# Patient Record
Sex: Male | Born: 1970 | Race: Black or African American | Hispanic: No | Marital: Married | Smoking: Current some day smoker
Health system: Southern US, Community
[De-identification: ages and names within clinical notes are randomized; demographics above are authoritative.]

## PROBLEM LIST (undated history)

## (undated) DIAGNOSIS — Z9861 Coronary angioplasty status: Secondary | ICD-10-CM

## (undated) DIAGNOSIS — I119 Hypertensive heart disease without heart failure: Secondary | ICD-10-CM

## (undated) DIAGNOSIS — Z720 Tobacco use: Secondary | ICD-10-CM

## (undated) DIAGNOSIS — I5042 Chronic combined systolic (congestive) and diastolic (congestive) heart failure: Secondary | ICD-10-CM

## (undated) DIAGNOSIS — E785 Hyperlipidemia, unspecified: Secondary | ICD-10-CM

## (undated) DIAGNOSIS — I2510 Atherosclerotic heart disease of native coronary artery without angina pectoris: Secondary | ICD-10-CM

## (undated) DIAGNOSIS — I255 Ischemic cardiomyopathy: Secondary | ICD-10-CM

## (undated) HISTORY — DX: Hypertensive heart disease without heart failure: I11.9

## (undated) HISTORY — DX: Chronic combined systolic (congestive) and diastolic (congestive) heart failure: I50.42

## (undated) HISTORY — DX: Tobacco use: Z72.0

## (undated) HISTORY — DX: Hyperlipidemia, unspecified: E78.5

## (undated) HISTORY — DX: Atherosclerotic heart disease of native coronary artery without angina pectoris: I25.10

## (undated) HISTORY — DX: Coronary angioplasty status: Z98.61

---

## 2015-04-22 DIAGNOSIS — Z9861 Coronary angioplasty status: Secondary | ICD-10-CM

## 2015-04-22 DIAGNOSIS — I2510 Atherosclerotic heart disease of native coronary artery without angina pectoris: Secondary | ICD-10-CM

## 2015-04-22 DIAGNOSIS — I5042 Chronic combined systolic (congestive) and diastolic (congestive) heart failure: Secondary | ICD-10-CM

## 2015-04-22 HISTORY — PX: CORONARY ANGIOPLASTY WITH STENT PLACEMENT: SHX49

## 2015-04-22 HISTORY — PX: TRANSTHORACIC ECHOCARDIOGRAM: SHX275

## 2015-04-22 HISTORY — DX: Chronic combined systolic (congestive) and diastolic (congestive) heart failure: I50.42

## 2015-04-22 HISTORY — DX: Atherosclerotic heart disease of native coronary artery without angina pectoris: I25.10

## 2015-05-03 DIAGNOSIS — I213 ST elevation (STEMI) myocardial infarction of unspecified site: Secondary | ICD-10-CM

## 2015-05-03 HISTORY — DX: ST elevation (STEMI) myocardial infarction of unspecified site: I21.3

## 2015-05-06 DIAGNOSIS — F17200 Nicotine dependence, unspecified, uncomplicated: Secondary | ICD-10-CM | POA: Insufficient documentation

## 2015-05-14 ENCOUNTER — Ambulatory Visit: Payer: Self-pay | Admitting: Family Medicine

## 2015-05-23 DIAGNOSIS — R945 Abnormal results of liver function studies: Secondary | ICD-10-CM | POA: Insufficient documentation

## 2015-05-23 DIAGNOSIS — R7989 Other specified abnormal findings of blood chemistry: Secondary | ICD-10-CM | POA: Insufficient documentation

## 2015-05-23 DIAGNOSIS — I5042 Chronic combined systolic (congestive) and diastolic (congestive) heart failure: Secondary | ICD-10-CM | POA: Insufficient documentation

## 2015-05-23 DIAGNOSIS — I5022 Chronic systolic (congestive) heart failure: Secondary | ICD-10-CM | POA: Insufficient documentation

## 2015-12-21 HISTORY — PX: TRANSTHORACIC ECHOCARDIOGRAM: SHX275

## 2015-12-24 ENCOUNTER — Inpatient Hospital Stay
Admission: EM | Admit: 2015-12-24 | Discharge: 2015-12-26 | DRG: 291 | Disposition: A | Discharge status: Home / Self Care | Payer: PRIVATE HEALTH INSURANCE | Attending: Internal Medicine | Admitting: Internal Medicine

## 2015-12-24 ENCOUNTER — Emergency Department: Payer: PRIVATE HEALTH INSURANCE

## 2015-12-24 ENCOUNTER — Encounter: Payer: Self-pay | Admitting: *Deleted

## 2015-12-24 ENCOUNTER — Other Ambulatory Visit: Payer: Self-pay

## 2015-12-24 DIAGNOSIS — J810 Acute pulmonary edema: Secondary | ICD-10-CM | POA: Diagnosis present

## 2015-12-24 DIAGNOSIS — Z6830 Body mass index (BMI) 30.0-30.9, adult: Secondary | ICD-10-CM

## 2015-12-24 DIAGNOSIS — E669 Obesity, unspecified: Secondary | ICD-10-CM | POA: Diagnosis present

## 2015-12-24 DIAGNOSIS — I252 Old myocardial infarction: Secondary | ICD-10-CM | POA: Diagnosis not present

## 2015-12-24 DIAGNOSIS — Z8249 Family history of ischemic heart disease and other diseases of the circulatory system: Secondary | ICD-10-CM | POA: Diagnosis not present

## 2015-12-24 DIAGNOSIS — I5043 Acute on chronic combined systolic (congestive) and diastolic (congestive) heart failure: Secondary | ICD-10-CM | POA: Diagnosis present

## 2015-12-24 DIAGNOSIS — I110 Hypertensive heart disease with heart failure: Secondary | ICD-10-CM | POA: Diagnosis present

## 2015-12-24 DIAGNOSIS — J9601 Acute respiratory failure with hypoxia: Secondary | ICD-10-CM | POA: Diagnosis present

## 2015-12-24 DIAGNOSIS — I255 Ischemic cardiomyopathy: Secondary | ICD-10-CM | POA: Diagnosis present

## 2015-12-24 DIAGNOSIS — Z7902 Long term (current) use of antithrombotics/antiplatelets: Secondary | ICD-10-CM | POA: Diagnosis not present

## 2015-12-24 DIAGNOSIS — I5021 Acute systolic (congestive) heart failure: Secondary | ICD-10-CM

## 2015-12-24 DIAGNOSIS — F17200 Nicotine dependence, unspecified, uncomplicated: Secondary | ICD-10-CM | POA: Diagnosis present

## 2015-12-24 DIAGNOSIS — Z79899 Other long term (current) drug therapy: Secondary | ICD-10-CM

## 2015-12-24 DIAGNOSIS — I160 Hypertensive urgency: Secondary | ICD-10-CM | POA: Diagnosis present

## 2015-12-24 DIAGNOSIS — Z7982 Long term (current) use of aspirin: Secondary | ICD-10-CM

## 2015-12-24 DIAGNOSIS — I2510 Atherosclerotic heart disease of native coronary artery without angina pectoris: Secondary | ICD-10-CM | POA: Diagnosis present

## 2015-12-24 DIAGNOSIS — E876 Hypokalemia: Secondary | ICD-10-CM | POA: Diagnosis present

## 2015-12-24 DIAGNOSIS — Z833 Family history of diabetes mellitus: Secondary | ICD-10-CM | POA: Diagnosis not present

## 2015-12-24 DIAGNOSIS — Z955 Presence of coronary angioplasty implant and graft: Secondary | ICD-10-CM

## 2015-12-24 DIAGNOSIS — I509 Heart failure, unspecified: Secondary | ICD-10-CM | POA: Diagnosis not present

## 2015-12-24 DIAGNOSIS — I5023 Acute on chronic systolic (congestive) heart failure: Secondary | ICD-10-CM | POA: Diagnosis not present

## 2015-12-24 HISTORY — DX: Ischemic cardiomyopathy: I25.5

## 2015-12-24 LAB — CBC
HCT: 39.7 % — ABNORMAL LOW (ref 40.0–52.0)
Hemoglobin: 13.8 g/dL (ref 13.0–18.0)
MCH: 32.5 pg (ref 26.0–34.0)
MCHC: 34.7 g/dL (ref 32.0–36.0)
MCV: 93.9 fL (ref 80.0–100.0)
Platelets: 298 10*3/uL (ref 150–440)
RBC: 4.23 MIL/uL — ABNORMAL LOW (ref 4.40–5.90)
RDW: 14.9 % — ABNORMAL HIGH (ref 11.5–14.5)
WBC: 14.1 10*3/uL — ABNORMAL HIGH (ref 3.8–10.6)

## 2015-12-24 LAB — TROPONIN I: Troponin I: <0.03 ng/mL (ref <0.031)

## 2015-12-24 LAB — BRAIN NATRIURETIC PEPTIDE: B Natriuretic Peptide: 1301 pg/mL — ABNORMAL HIGH (ref 0.0–100.0)

## 2015-12-24 LAB — COMPREHENSIVE METABOLIC PANEL
ALT: 36 U/L (ref 17–63)
AST: 33 U/L (ref 15–41)
Albumin: 4 g/dL (ref 3.5–5.0)
Alkaline Phosphatase: 61 U/L (ref 38–126)
Anion gap: 9 (ref 5–15)
BUN: 14 mg/dL (ref 6–20)
CO2: 23 mmol/L (ref 22–32)
Calcium: 9.4 mg/dL (ref 8.9–10.3)
Chloride: 106 mmol/L (ref 101–111)
Creatinine, Ser: 1.14 mg/dL (ref 0.61–1.24)
GFR calc Af Amer: >60 mL/min (ref >60)
GFR calc non Af Amer: >60 mL/min (ref >60)
Glucose, Bld: 107 mg/dL — ABNORMAL HIGH (ref 65–99)
Potassium: 3.4 mmol/L — ABNORMAL LOW (ref 3.5–5.1)
Sodium: 138 mmol/L (ref 135–145)
Total Bilirubin: 1.8 mg/dL — ABNORMAL HIGH (ref 0.3–1.2)
Total Protein: 7.5 g/dL (ref 6.5–8.1)

## 2015-12-24 LAB — APTT: aPTT: 28 seconds (ref 24–36)

## 2015-12-24 LAB — PROTIME-INR
INR: 1.07
Prothrombin Time: 14.1 seconds (ref 11.4–15.0)

## 2015-12-24 MED ORDER — FUROSEMIDE 10 MG/ML IJ SOLN
40.0000 mg | Freq: Once | INTRAMUSCULAR | Status: AC
  Administered 2015-12-24: 40 mg via INTRAVENOUS

## 2015-12-24 MED ORDER — ONDANSETRON HCL 4 MG/2ML IJ SOLN
4.0000 mg | Freq: Once | INTRAMUSCULAR | Status: AC
  Administered 2015-12-24: 4 mg via INTRAVENOUS

## 2015-12-24 MED ORDER — NITROGLYCERIN IN D5W 200-5 MCG/ML-% IV SOLN
0.0000 ug/min | Freq: Once | INTRAVENOUS | Status: AC
  Administered 2015-12-24: 50 ug/min via INTRAVENOUS
  Filled 2015-12-24: qty 250

## 2015-12-24 MED ORDER — FUROSEMIDE 10 MG/ML IJ SOLN
INTRAMUSCULAR | Status: AC
  Administered 2015-12-24: 40 mg via INTRAVENOUS
  Filled 2015-12-24: qty 4

## 2015-12-24 MED ORDER — ONDANSETRON HCL 4 MG/2ML IJ SOLN
INTRAMUSCULAR | Status: AC
  Filled 2015-12-24: qty 2

## 2015-12-24 MED ORDER — NITROGLYCERIN 0.4 MG SL SUBL
0.4000 mg | SUBLINGUAL_TABLET | SUBLINGUAL | Status: DC | PRN
  Administered 2015-12-24 (×3): 0.4 mg via SUBLINGUAL

## 2015-12-24 MED ORDER — NITROGLYCERIN IN D5W 200-5 MCG/ML-% IV SOLN: 0.0000 ug/min | INTRAVENOUS | Status: DC

## 2015-12-24 NOTE — ED Notes (Addendum)
Pt reports has sob since 1530 today.  Pt had MI 04-2015 and has cardiac stents.  cig smoker and reports a cough today.  Pt denies chest pain.  Pt alert. Pt diaphoretic.  Blood pressure elevated.  No bp meds today.

## 2015-12-24 NOTE — ED Notes (Signed)
Pt put on nonrebreather

## 2015-12-24 NOTE — ED Notes (Signed)
MD at bedside. 

## 2015-12-24 NOTE — H&P (Signed)
[facility] [place] NAME: Jeff Cobb    MR#:  [id number]  DATE OF BIRTH:  [birth year]  DATE OF ADMISSION:  12/24/2015  PRIMARY CARE PHYSICIAN: Pcp Not In System   REQUESTING/REFERRING PHYSICIAN:   CHIEF COMPLAINT:   Chief Complaint  Patient presents with  . Shortness of Breath  . Cough    HISTORY OF PRESENT ILLNESS: Jeff Cobb  is a 45 y.o. male with a known history of hypertension, systolic dysfunction, myocardial infarction presented to the emergency room with difficulty breathing since yesterday afternoon. Patient felt acutely short of breath and is not on home oxygen. Last year of respiration and ENT [facility] for ST elevation myocardial infarction, had a cardiac catheter and stent placement. Patient was initially put on oxygen via nonrebreather mask in the emergency room and stabilized. He was given IV Lasix for diuresis. Patient's chest x-ray showed pulmonary edema he was in heart failure. Has history of orthopnea and paroxysmal nocturnal dyspnea. No history of any chest pain. No history of any cough. Patients blood pressure was very high ,systolic was around [id number] mm of Hg and patient was started on IV nitroglycerin drip in the emergency room. Patient does not follow with any cardiologist in the community.Later on patient was weaned to oxygen via nasal canula at 4 liter.  PAST MEDICAL HISTORY:   Past Medical History  Diagnosis Date  . STEMI (ST elevation myocardial infarction) ([place])   . Hypertension   . Systolic dysfunction     PAST SURGICAL HISTORY: Past Surgical History  Procedure Laterality Date  . Coronary angioplasty with stent placement      SOCIAL HISTORY:  Social History  Substance Use Topics  . Smoking status: Current Every Day Smoker  . Smokeless tobacco: Not on file  . Alcohol Use: 0.0 oz/week    0 Standard drinks or equivalent per week    FAMILY HISTORY:  Family History  Problem Relation Age of Onset  .  Heart disease Father   . Diabetes Mellitus II Father     DRUG ALLERGIES: No Known Allergies  REVIEW OF SYSTEMS:   CONSTITUTIONAL: No fever, fatigue or weakness.  EYES: No blurred or double vision.  EARS, NOSE, AND THROAT: No tinnitus or ear pain.  RESPIRATORY: No cough, has shortness of breath,no wheezing or hemoptysis.  CARDIOVASCULAR: No chest pain, has orthopnea, no edema.  GASTROINTESTINAL: No nausea, vomiting, diarrhea or abdominal pain.  GENITOURINARY: No dysuria, hematuria.  ENDOCRINE: No polyuria, nocturia,  HEMATOLOGY: No anemia, easy bruising or bleeding SKIN: No rash or lesion. MUSCULOSKELETAL: No joint pain or arthritis.   NEUROLOGIC: No tingling, numbness, weakness.  PSYCHIATRY: No anxiety or depression.   MEDICATIONS AT HOME:  Prior to Admission medications   Not on File      PHYSICAL EXAMINATION:   VITAL SIGNS: Blood pressure 160/121, pulse 115, temperature 99.7 F (37.6 C), temperature source Oral, resp. rate 24, height 5\' 6"  (1.676 m), weight 86.183 kg (190 lb), SpO2 96 %.  GENERAL:  45 y.o.-year-old patient lying in the bed with no acute distress.  EYES: Pupils equal, round, reactive to light and accommodation. No scleral icterus. Extraocular muscles intact.  HEENT: Head atraumatic, normocephalic. Oropharynx and nasopharynx clear.  NECK:  Supple, no jugular venous distention. No thyroid enlargement, no tenderness.  LUNGS: Decreased breath sounds bilaterally, basal crepitations both lungs. No use of accessory muscles of respiration.  CARDIOVASCULAR: S1, S2 normal. No murmurs, rubs, or gallops.  ABDOMEN: Soft,  nontender, nondistended. Bowel sounds present. No organomegaly or mass.  EXTREMITIES: No pedal edema, cyanosis, or clubbing.  NEUROLOGIC: Cranial nerves II through XII are intact. Muscle strength 5/5 in all extremities. Sensation intact. Gait not checked.  PSYCHIATRIC: The patient is alert and oriented x 3.  SKIN: No obvious rash, lesion, or ulcer.    LABORATORY PANEL:   CBC  Recent Labs Lab 12/24/15 2153  WBC 14.1*  HGB 13.8  HCT 39.7*  PLT 298  MCV 93.9  MCH 32.5  MCHC 34.7  RDW 14.9*   ------------------------------------------------------------------------------------------------------------------  Chemistries   Recent Labs Lab 12/24/15 2153  NA 138  K 3.4*  CL 106  CO2 23  GLUCOSE 107*  BUN 14  CREATININE 1.14  CALCIUM 9.4  AST 33  ALT 36  ALKPHOS 61  BILITOT 1.8*   ------------------------------------------------------------------------------------------------------------------ estimated creatinine clearance is 86.9 mL/min (by C-G formula based on Cr of 1.14). ------------------------------------------------------------------------------------------------------------------ No results for input(s): TSH, T4TOTAL, T3FREE, THYROIDAB in the last 72 hours.  Invalid input(s): FREET3   Coagulation profile  Recent Labs Lab 12/24/15 2153  INR 1.07   ------------------------------------------------------------------------------------------------------------------- No results for input(s): DDIMER in the last 72 hours. -------------------------------------------------------------------------------------------------------------------  Cardiac Enzymes  Recent Labs Lab 12/24/15 2153  TROPONINI <0.03   ------------------------------------------------------------------------------------------------------------------ Invalid input(s): POCBNP  ---------------------------------------------------------------------------------------------------------------  Urinalysis No results found for: COLORURINE, APPEARANCEUR, LABSPEC, PHURINE, GLUCOSEU, HGBUR, BILIRUBINUR, KETONESUR, PROTEINUR, UROBILINOGEN, NITRITE, LEUKOCYTESUR   RADIOLOGY: Dg Chest Portable 1 View  12/24/2015  CLINICAL DATA:  Shortness of breath. History of myocardial infarction and coronary artery disease. EXAM: PORTABLE CHEST 1 VIEW COMPARISON:   None. FINDINGS: The heart is moderately enlarged. There is evidence of pulmonary edema diffusely throughout both lungs without evidence of significant pleural fluid. IMPRESSION: Cardiac enlargement and diffuse pulmonary edema, likely reflecting congestive heart failure Electronically Signed   By: Aletta Edouard M.D.   On: 12/24/2015 22:21    EKG: Orders placed or performed during the hospital encounter of 12/24/15  . EKG 12-Lead  . EKG 12-Lead    IMPRESSION AND PLAN: 45 year old male patient with history of myocardial infarction, hypertension presented to the emergency room with increased shortness of breath. Admitting diagnosis 1. Acute pulmonary edema 2. Acute systolic heart failure 3. Hypertensive urgency 4. Hypokalemia 5. Acute respiratory failure Treatment plan Admit patient to stepdown unit Diurese patient with IV Lasix Oxygen via nasal cannula IV nitroglycerin drip for control of blood pressure Start patient on aspirin, beta blocker, ACE inhibitor Cardiology consultation Check echocardiogram to assess LV function Cycle troponin check for ischemia Anticoagulate patient with subcutaneous Lovenox.  All the records are reviewed and case discussed with ED provider. Management plans discussed with the patient, family and they are in agreement.  CODE STATUS:FULL Code Status History    This patient does not have a recorded code status. Please follow your organizational policy for patients in this situation.       TOTAL CRITICAL CARE TIME TAKING CARE OF THIS PATIENT: 55 minutes.    Saundra Shelling M.D on 12/24/2015 at 11:59 PM  Between 7am to 6pm - Pager - [phone]  After 6pm go to [email or web address] - password [email or web address] [place] [facility]  Office  [phone]  CC: Primary care physician; Pcp Not In System

## 2015-12-24 NOTE — ED Provider Notes (Signed)
[facility] Emergency Department Provider Note  ____________________________________________  Time seen: 10:15 PM  I have reviewed the triage vital signs and the nursing notes.   HISTORY  Chief Complaint Shortness of Breath and Cough Level 5 caveat:  Portions of the history and physical were unable to be obtained or not in the patient's best interest due to the patient's acute illness    HPI Jeff Cobb is a 45 y.o. male who complains of severe worsening shortness of breath. Onset of symptoms was about 3:00 PM today. He has a history of STEMI which was treated at [facility] last year. He reports being compliant with all his medications except not taking his antihypertensives today or yesterday. He is still smoking. He has not missed any doses of his Plavix. Denies any chest pain at all today. No exertional symptoms. He has a productive cough. No fever. No dizziness or syncope.     No past medical history on file. Hypertension CAD status post STEMI  There are no active problems to display for this patient.    No past surgical history on file. Cardiac catheterization with stent.  No current outpatient prescriptions on file. Carvedilol Nitroglycerin Plavix Aspirin  Allergies Review of patient's allergies indicates no known allergies.   No family history on file.  Social History Social History  Substance Use Topics  . Smoking status: Never Smoker   . Smokeless tobacco: None  . Alcohol Use: Yes  Current daily smoker  Review of Systems  Constitutional:   No fever or chills. No weight changes Eyes:   No vision changes.  ENT:   No sore throat. No rhinorrhea. Cardiovascular:   No chest pain. Respiratory:   Positive shortness of breath and cough. Gastrointestinal:   Negative for abdominal pain, vomiting and diarrhea.  No BRBPR or melena. Genitourinary:   Negative for dysuria or difficulty urinating. Musculoskeletal:   Negative for focal pain or  swelling Skin:   Negative for rash. Neurological:   Negative for headaches, focal weakness or numbness.  10-point ROS otherwise negative.  ____________________________________________   PHYSICAL EXAM:  VITAL SIGNS: ED Triage Vitals  Enc Vitals Group     BP 12/24/15 2134 220/143 mmHg     Pulse Rate 12/24/15 2134 127     Resp 12/24/15 2134 20     Temp 12/24/15 2134 99.7 F (37.6 C)     Temp Source 12/24/15 2134 Oral     SpO2 12/24/15 2134 94 %     Weight 12/24/15 2134 190 lb (86.183 kg)     Height 12/24/15 2134 5\' 6"  (1.676 m)     Head Cir --      Peak Flow --      Pain Score --      Pain Loc --      Pain Edu? --      Excl. in [place]? --     Vital signs reviewed, nursing assessments reviewed. Oxygen saturation 82% on room air, increased to 92% on 4 L nasal cannula  Constitutional:   Alert and oriented. Respiratory distress Eyes:   No scleral icterus. No conjunctival pallor. PERRL. EOMI ENT   Head:   Normocephalic and atraumatic.   Nose:   No congestion/rhinnorhea. No septal hematoma   Mouth/Throat:   MMM, no pharyngeal erythema. No peritonsillar mass.    Neck:   No stridor. No SubQ emphysema. No meningismus. Hematological/Lymphatic/Immunilogical:   No cervical lymphadenopathy. Cardiovascular:   Tachycardia heart rate 140. Symmetric bilateral radial and DP  pulses.  No murmurs.  Respiratory:   Tachypnea, increased work of breathing. Diffuse coarse inspiratory and expiratory breath sounds. Patient coughing up pink frothy sputum.. Gastrointestinal:   Soft and nontender. Non distended. There is no CVA tenderness.  No rebound, rigidity, or guarding. Genitourinary:   deferred Musculoskeletal:   Nontender with normal range of motion in all extremities. No joint effusions.  No lower extremity tenderness.  No edema. Neurologic:   Normal speech and language.  CN 2-10 normal. Motor grossly intact. No gross focal neurologic deficits are appreciated.  Skin:    Skin is  warm, dry and intact. No rash noted.  No petechiae, purpura, or bullae. Psychiatric:   Mood and affect are normal. ____________________________________________    LABS (pertinent positives/negatives) (all labs ordered are listed, but only abnormal results are displayed) Labs Reviewed  CBC - Abnormal; Notable for the following:    WBC 14.1 (*)    RBC 4.23 (*)    HCT 39.7 (*)    RDW 14.9 (*)    All other components within normal limits  COMPREHENSIVE METABOLIC PANEL - Abnormal; Notable for the following:    Potassium 3.4 (*)    Glucose, Bld 107 (*)    Total Bilirubin 1.8 (*)    All other components within normal limits  BRAIN NATRIURETIC PEPTIDE - Abnormal; Notable for the following:    B Natriuretic Peptide 1301.0 (*)    All other components within normal limits  TROPONIN I  APTT  PROTIME-INR   ____________________________________________   EKG  EKG at 10:23 PM interpreted by me Sinus tachycardia rate 119, left axis, normal intervals. Poor R-wave progression in anterior precordial leads. There is one to 2 mm J-point elevation in V2 through V5. Slightly inverted T-wave in aVL, no reciprocal ST depressions. Not consistent with STEMI.  Repeat EKG performed at 10:40 PM interpreted by me Sinus tachycardia rate 111, left axis, normal intervals. Again 2-3 mm J-point elevation in V2 through V5. No reciprocal changes. No STEMI.  Prior EKG from [facility] on 05/05/2015 for comparison shows 4-5 mm J-point elevation in the anterior leads with concurrent T-wave inversions diffusely.  Considering the prior EKG, the J-point elevation is a chronic pattern and does not represent any acute changes. Overall today's EKG is actually improved from previous.  ____________________________________________    RADIOLOGY  Chest x-ray shows diffuse pulmonary edema.  ____________________________________________   PROCEDURES CRITICAL CARE Performed by: Joni Fears, Costella Schwarz   Total critical care time:  35 minutes  Critical care time was exclusive of separately billable procedures and treating other patients.  Critical care was necessary to treat or prevent imminent or life-threatening deterioration.  Critical care was time spent personally by me on the following activities: development of treatment plan with patient and/or surrogate as well as nursing, discussions with consultants, evaluation of patient's response to treatment, examination of patient, obtaining history from patient or surrogate, ordering and performing treatments and interventions, ordering and review of laboratory studies, ordering and review of radiographic studies, pulse oximetry and re-evaluation of patient's condition.   ____________________________________________   INITIAL IMPRESSION / ASSESSMENT AND PLAN / ED COURSE  Pertinent labs & imaging results that were available during my care of the patient were reviewed by me and considered in my medical decision making (see chart for details).  Patient presents with severe respiratory distress and hypoxia, clinical exam highly suspicious for acute pulmonary edema. Oxygen saturation is improved with nasal cannula and then nonrebreather. Give the patient a trial of sublingual nitroglycerin because  his blood pressure was 210/140 and I think that this would help with his pulmonary edema. He did start to feel better after the first nitroglycerin and continued to feel better and better after the second and third sublingual nitroglycerin with it started about a nitroglycerin drip. He continues to feel much better with frequent rechecking. Tachycardia improved down to 110. Blood pressure is improving down to about 160/130. Tachypnea is also improving. Patient is weaning off of the supplemental oxygen and back to nasal cannula. Case discussed with the hospitalist for admission. I also discussed the case with [facility] cardiology since he was managed there before we don't have any previous EKGs  in our electronic medical record to compare to. They note that on August 12 he had a STEMI and underwent cardiac catheterizations. On August 14 he had persistent 4-5 mm ST elevation in the anterior leads. This sounds like a similar pattern to what were seen on our EKGs today except that are EKGs today are actually to a lesser degree abnormal. 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 Initial troponin negative even after several hours of symptoms. EKG is not consistent with STEMI. We'll admit for further management of pulmonary edema and hypoxic respiratory failure.     ____________________________________________   FINAL CLINICAL IMPRESSION(S) / ED DIAGNOSES  Final diagnoses:  Acute pulmonary edema ([place])  Acute respiratory failure with hypoxia    Carrie Mew, MD 12/24/15 [phone]

## 2015-12-24 NOTE — ED Notes (Signed)
Pt taken off non-rebreather, on 4L [place] at this time. Pt 96%

## 2015-12-25 ENCOUNTER — Inpatient Hospital Stay (HOSPITAL_COMMUNITY)
Admit: 2015-12-25 | Discharge: 2015-12-25 | Disposition: A | Discharge status: Home / Self Care | Payer: PRIVATE HEALTH INSURANCE | Attending: Internal Medicine | Admitting: Internal Medicine

## 2015-12-25 DIAGNOSIS — I5043 Acute on chronic combined systolic (congestive) and diastolic (congestive) heart failure: Secondary | ICD-10-CM

## 2015-12-25 DIAGNOSIS — Z9861 Coronary angioplasty status: Secondary | ICD-10-CM

## 2015-12-25 DIAGNOSIS — J810 Acute pulmonary edema: Secondary | ICD-10-CM

## 2015-12-25 DIAGNOSIS — I10 Essential (primary) hypertension: Secondary | ICD-10-CM

## 2015-12-25 DIAGNOSIS — I509 Heart failure, unspecified: Secondary | ICD-10-CM

## 2015-12-25 DIAGNOSIS — Z720 Tobacco use: Secondary | ICD-10-CM

## 2015-12-25 DIAGNOSIS — I255 Ischemic cardiomyopathy: Secondary | ICD-10-CM

## 2015-12-25 DIAGNOSIS — I2510 Atherosclerotic heart disease of native coronary artery without angina pectoris: Secondary | ICD-10-CM

## 2015-12-25 LAB — BASIC METABOLIC PANEL
Anion gap: 8 (ref 5–15)
BUN: 15 mg/dL (ref 6–20)
CO2: 27 mmol/L (ref 22–32)
Calcium: 9.2 mg/dL (ref 8.9–10.3)
Chloride: 102 mmol/L (ref 101–111)
Creatinine, Ser: 1.18 mg/dL (ref 0.61–1.24)
GFR calc Af Amer: >60 mL/min (ref >60)
GFR calc non Af Amer: >60 mL/min (ref >60)
Glucose, Bld: 113 mg/dL — ABNORMAL HIGH (ref 65–99)
Potassium: 3.6 mmol/L (ref 3.5–5.1)
Sodium: 137 mmol/L (ref 135–145)

## 2015-12-25 LAB — MAGNESIUM: Magnesium: 1.8 mg/dL (ref 1.7–2.4)

## 2015-12-25 LAB — TROPONIN I
Troponin I: <0.03 ng/mL (ref <0.031)
Troponin I: <0.03 ng/mL (ref <0.031)
Troponin I: 0.04 ng/mL — ABNORMAL HIGH (ref <0.031)

## 2015-12-25 LAB — MRSA PCR SCREENING: MRSA by PCR: NEGATIVE

## 2015-12-25 LAB — PHOSPHORUS: Phosphorus: 4.7 mg/dL — ABNORMAL HIGH (ref 2.5–4.6)

## 2015-12-25 LAB — GLUCOSE, CAPILLARY: Glucose-Capillary: 115 mg/dL — ABNORMAL HIGH (ref 65–99)

## 2015-12-25 MED ORDER — ONDANSETRON HCL 4 MG/2ML IJ SOLN: 4.0000 mg | Freq: Four times a day (QID) | INTRAMUSCULAR | Status: DC | PRN

## 2015-12-25 MED ORDER — METOPROLOL TARTRATE 25 MG PO TABS
25.0000 mg | ORAL_TABLET | Freq: Two times a day (BID) | ORAL | Status: DC
  Administered 2015-12-25: 25 mg via ORAL
  Filled 2015-12-25: qty 1

## 2015-12-25 MED ORDER — SODIUM CHLORIDE 0.9% FLUSH
3.0000 mL | Freq: Two times a day (BID) | INTRAVENOUS | Status: DC
  Administered 2015-12-25 – 2015-12-26 (×4): 3 mL via INTRAVENOUS

## 2015-12-25 MED ORDER — POTASSIUM CHLORIDE CRYS ER 20 MEQ PO TBCR
20.0000 meq | EXTENDED_RELEASE_TABLET | Freq: Once | ORAL | Status: AC
  Administered 2015-12-25: 20 meq via ORAL
  Filled 2015-12-25: qty 1

## 2015-12-25 MED ORDER — ACETAMINOPHEN 325 MG PO TABS
650.0000 mg | ORAL_TABLET | ORAL | Status: DC | PRN
  Administered 2015-12-25: 650 mg via ORAL
  Filled 2015-12-25: qty 2

## 2015-12-25 MED ORDER — SODIUM CHLORIDE 0.9% FLUSH: 3.0000 mL | INTRAVENOUS | Status: DC | PRN

## 2015-12-25 MED ORDER — ASPIRIN EC 81 MG PO TBEC
81.0000 mg | DELAYED_RELEASE_TABLET | Freq: Every day | ORAL | Status: DC
  Administered 2015-12-25 – 2015-12-26 (×2): 81 mg via ORAL
  Filled 2015-12-25 (×2): qty 1

## 2015-12-25 MED ORDER — ENOXAPARIN SODIUM 40 MG/0.4ML ~~LOC~~ SOLN
40.0000 mg | SUBCUTANEOUS | Status: DC
  Administered 2015-12-25: 40 mg via SUBCUTANEOUS
  Filled 2015-12-25: qty 0.4

## 2015-12-25 MED ORDER — FENTANYL CITRATE (PF) 100 MCG/2ML IJ SOLN: 12.5000 ug | INTRAMUSCULAR | Status: DC | PRN

## 2015-12-25 MED ORDER — CARVEDILOL 12.5 MG PO TABS
25.0000 mg | ORAL_TABLET | Freq: Two times a day (BID) | ORAL | Status: DC
  Administered 2015-12-25 – 2015-12-26 (×2): 25 mg via ORAL
  Filled 2015-12-25 (×2): qty 2

## 2015-12-25 MED ORDER — SPIRONOLACTONE 25 MG PO TABS
50.0000 mg | ORAL_TABLET | Freq: Every day | ORAL | Status: DC
  Administered 2015-12-25 – 2015-12-26 (×2): 50 mg via ORAL
  Filled 2015-12-25 (×2): qty 2

## 2015-12-25 MED ORDER — POTASSIUM CHLORIDE CRYS ER 20 MEQ PO TBCR
40.0000 meq | EXTENDED_RELEASE_TABLET | Freq: Once | ORAL | Status: AC
  Administered 2015-12-25: 40 meq via ORAL
  Filled 2015-12-25: qty 2

## 2015-12-25 MED ORDER — CETYLPYRIDINIUM CHLORIDE 0.05 % MT LIQD
7.0000 mL | Freq: Two times a day (BID) | OROMUCOSAL | Status: DC
  Administered 2015-12-25 – 2015-12-26 (×3): 7 mL via OROMUCOSAL

## 2015-12-25 MED ORDER — DEXTROSE 5 % IV SOLN
1.0000 mg/min | INTRAVENOUS | Status: DC
  Administered 2015-12-25: 1 mg/min via INTRAVENOUS
  Filled 2015-12-25: qty 100

## 2015-12-25 MED ORDER — FUROSEMIDE 10 MG/ML IJ SOLN
80.0000 mg | Freq: Two times a day (BID) | INTRAMUSCULAR | Status: DC
  Administered 2015-12-25 – 2015-12-26 (×3): 80 mg via INTRAVENOUS
  Filled 2015-12-25 (×3): qty 8

## 2015-12-25 MED ORDER — LISINOPRIL 10 MG PO TABS
10.0000 mg | ORAL_TABLET | Freq: Every day | ORAL | Status: DC
  Administered 2015-12-25 – 2015-12-26 (×2): 10 mg via ORAL
  Filled 2015-12-25 (×2): qty 1

## 2015-12-25 MED ORDER — SODIUM CHLORIDE 0.9 % IV SOLN: 250.0000 mL | INTRAVENOUS | Status: DC | PRN

## 2015-12-25 MED ORDER — ATORVASTATIN CALCIUM 20 MG PO TABS: 40.0000 mg | ORAL_TABLET | Freq: Every day | ORAL | Status: DC

## 2015-12-25 NOTE — Care Management (Addendum)
Patient had am MI in August 2016 and treated at [facility].  He admits that his follow up and compliance was not good  because he was in between [employer] and under a lot of stress.  He admits to scattered noncompliance with meds.  He says he is in a better place now and compliant with his treatment regime.  He is gong to get established with [facility] and [facility] for PCP.  Wonder if patient would benefit from [employer] program.  Discussed that CM will follow discharge meds to make sure there are no  cost prohibitive meds ordered at discharge.  Patient will need to be assessed for need of home 02.  Referral to Heart Failure Clinic.

## 2015-12-25 NOTE — Consult Note (Addendum)
Cardiology Consultation Note  Patient ID: Washington Georgiev, MRN: [id number], DOB/AGE: [birth year] 45 y.o. Admit date: 12/24/2015   Date of Consult: 12/25/2015 Primary Physician: Pcp Not In System Primary Cardiologist: New to [facility], previously seen by [facility] s/p anterior wall ST elevation MI 05/03/2015  Chief Complaint: SOB Reason for Consult: Acute on chronic combined systolic and diastolic CHF with pulmonary edema  HPI: 45 y.o. male with h/o CAD s/p anterior wall ST elevation MI on 05/03/2015 s/p PCI/DES to mid LAD at [facility] with residual 50% stenosis of the distal RCA, otherwise no evidence of CAD seen at time of cardiac cath, ischemic cardiomyopathy with EF of 20-25% by echo, HTN, continued tobacco abuse, and obesity who presented to [facility] on 4/4 with acute SOB and was found to have acute on chronic combined CHF, malignant HTN, and pulmonary edema. Cardiology is consulted for further evaluation.   He has not seen his primary cardiologist at [facility] since September 2016. Since that time he reported doing well up until 3-4 days ago when he developed a cough overnight that was productive of pink-tinged sputum. This progressively led to increased SOB that worsened on 4/4 prompting him to come to the ED for further evaluation. His weight has been stable and he denies any LE edema, abdominal distension, or early satiety. He has never had any chest pain, diaphoresis, nausea, vomiting, or palpitations. He reports taking his medications daily. He does not check his BP at home. He has stable 2-pillow orthopnea.   Upon the patient's arrival to [facility] they were found to have troponin negative x 2, BNP 1301, WBC 14.1, K+ 3.4-->3.6. ECG as below, CXR showed moderate cardiomegaly with evidence of pulmonary edema diffusely throughout bilateral lungs without evidence of significant pleural effusion. Echo is pending. BP was found to be 220/143 upon his arrival. He was started labetalol and nitro gtts, IV Lasix, Lopressor, lisinopril, and  spironolactone. BP has improved to 102/66 at time of cardiology consult this morning. He has diuresed 2.25 L for the admission/24 hours to date.    Past Medical History  Diagnosis Date  . CAD (coronary artery disease)     a. anterior wall STEMI 04/2015: LM no dz, mLAD occluded s/p PCI/DES, LCx no dz, dRCA 50%  . Hypertension   . Ischemic cardiomyopathy     a. echo 8/12/2-16: EF 20-25%, LVH, basal anterior, mid anterior, apical anterior, mid anteroseptal, apical septal and apical hypokinesis. mildly dilated LA; b. echo 05/06/2015: Ef 25%, LVH, trivial MR, dilated thoracic aorta, anteroapical and apical akinesis and severely decreased contraction overall      Most Recent Cardiac Studies: Cardiac cath 05/03/2015: FINDINGS Hemodynamics and Left Heart Catheterization  Aortic pressure: 161/113 mmHg Coronary Angiography  Dominance: Right Left Main: The left main (LMCA) is a large-caliber vessel that originates  from the left coronary sinus. It bifurcates into the left anterior  descending (LAD) and left circumflex (LCx) arteries. There is no  angiographic evidence of significant disease in the LMCA. LAD: The LAD is a large-caliber vessel that gives off one large diagonal  (D) branches before it wraps around the apex. The LAD is occluded in its  mid-portion.  Left Circumflex: The LCx is a medium-caliber vessel that gives off three  obtuse marginal (OM) branches and then continues as a small vessel in the  AV groove. OM1 is a small. OM2 and OM3 are medium to large vessels. There  is no angiographic evidence of significant disease in the LCx. Right Coronary: The right coronary  artery (RCA) is a large-caliber vessel  originating from the right coronary sinus. It bifurcates distally into the  posterior descending artery (PDA) and a posterolateral branch (PL)  consistent with a right dominant system. There is a 50% distal RCA lesion  at PDA/PL bifurcation.  Echo  05/03/2015: Interpretation: Clinical Diagnoses and Echocardiographic Findings Left ventricular hypertrophy Contractile left ventricular dysfunction (severe) Segmental contractile left ventricular dysfunction (see detail below) Decreased left ventricular ejection fraction (20-25%) Anteroseptal and apical myocardial infarction Dilated left atrium Normal right ventricular contractile performance Descriptive Comments - Left Ventricle The left ventricle is normal in size with moderately increased wall thickness and severely decreased contraction. There is basal anterior, mid anterior, apical anterior, mid anteroseptal, apical septal and apical hypokinesis. The visually estimated left ventricular ejection fraction is 20-25%. Diastolic transmitral flow profile and mitral annular tissue Doppler signal are of suboptimal technical quality to evaluatel left ventricular relaxation and chamber compliance.  Echo 05/06/2015: Interpretation: Clinical Diagnoses and Echocardiographic Findings Coronary artery disease Anteroseptal and apical myocardial infarction Left ventricular hypertrophy Segmental contractile left ventricular dysfunction (estimated ejection fraction = 25%) Dilated left atrium Dilated thoracic aorta Descriptive Comments Left Ventricle There is moderate left ventricular hypertrophy with anteroapical and apical akinesis and severely decreased contraction overall. The visually estimated left ventricular ejection fraction is 25%.   Surgical History:  Past Surgical History  Procedure Laterality Date  . Coronary angioplasty with stent placement       Home Meds: Prior to Admission medications   Medication Sig Start Date End Date Taking? Authorizing Provider  acetaminophen (TYLENOL) 325 MG tablet Take 650 mg by mouth every 6 (six) hours as needed.   Yes Historical Provider, MD  aspirin EC 81 MG tablet Take 81 mg by mouth daily.   Yes Historical Provider, MD  atorvastatin  (LIPITOR) 80 MG tablet Take 80 mg by mouth daily.   Yes Historical Provider, MD  carvedilol (COREG) 25 MG tablet Take 25 mg by mouth 2 (two) times daily with a meal.   Yes Historical Provider, MD  clopidogrel (PLAVIX) 75 MG tablet Take 75 mg by mouth daily.   Yes Historical Provider, MD  lisinopril (PRINIVIL,ZESTRIL) 20 MG tablet Take 20 mg by mouth daily.   Yes Historical Provider, MD  nitroGLYCERIN (NITROSTAT) 0.4 MG SL tablet Place 0.4 mg under the tongue every 5 (five) minutes as needed for chest pain.   Yes Historical Provider, MD    Inpatient Medications:  . antiseptic oral rinse  7 mL Mouth Rinse BID  . aspirin EC  81 mg Oral Daily  . furosemide  80 mg Intravenous BID  . lisinopril  10 mg Oral Daily  . metoprolol tartrate  25 mg Oral BID  . sodium chloride flush  3 mL Intravenous Q12H  . spironolactone  50 mg Oral Daily   . labetalol (NORMODYNE) infusion 1 mg/min (12/25/15 0739)  . nitroGLYCERIN 35 mcg/min (12/25/15 0600)    Allergies: No Known Allergies  Social History   Social History  . Marital Status: Married    Spouse Name: N/A  . Number of Children: N/A  . Years of Education: N/A   Occupational History  . unemployed    Social History Main Topics  . Smoking status: Current Every Day Smoker  . Smokeless tobacco: Not on file  . Alcohol Use: 0.0 oz/week    0 Standard drinks or equivalent per week  . Drug Use: No  . Sexual Activity: Not on file   Other Topics Concern  .  Not on file   Social History Narrative     Family History  Problem Relation Age of Onset  . Heart disease Father   . Diabetes Mellitus II Father      Review of Systems: Review of Systems  Constitutional: Positive for malaise/fatigue. Negative for fever, chills, weight loss and diaphoresis.  HENT: Negative for congestion.   Eyes: Negative for discharge and redness.  Respiratory: Positive for cough, sputum production and shortness of breath. Negative for hemoptysis and wheezing.         Pink tinged sputum   Cardiovascular: Positive for PND. Negative for chest pain, palpitations, orthopnea, claudication and leg swelling.  Gastrointestinal: Negative for heartburn, nausea, vomiting and abdominal pain.  Musculoskeletal: Negative for myalgias and falls.  Skin: Negative for rash.  Neurological: Negative for dizziness, tingling, tremors, sensory change, speech change, focal weakness, loss of consciousness and weakness.  Endo/Heme/Allergies: Does not bruise/bleed easily.  Psychiatric/Behavioral: Positive for substance abuse. The patient is not nervous/anxious.        Ongoing tobacco abuse  All other systems reviewed and are negative.   Labs:  Recent Labs  12/24/15 2153 12/25/15 0338  TROPONINI <0.03 <0.03   Lab Results  Component Value Date   WBC 14.1* 12/24/2015   HGB 13.8 12/24/2015   HCT 39.7* 12/24/2015   MCV 93.9 12/24/2015   PLT 298 12/24/2015    Recent Labs Lab 12/24/15 2153 12/25/15 0338  NA 138 137  K 3.4* 3.6  CL 106 102  CO2 23 27  BUN 14 15  CREATININE 1.14 1.18  CALCIUM 9.4 9.2  PROT 7.5  --   BILITOT 1.8*  --   ALKPHOS 61  --   ALT 36  --   AST 33  --   GLUCOSE 107* 113*   No results found for: CHOL, HDL, LDLCALC, TRIG No results found for: DDIMER  Radiology/Studies:  Dg Chest Portable 1 View  12/24/2015  CLINICAL DATA:  Shortness of breath. History of myocardial infarction and coronary artery disease. EXAM: PORTABLE CHEST 1 VIEW COMPARISON:  None. FINDINGS: The heart is moderately enlarged. There is evidence of pulmonary edema diffusely throughout both lungs without evidence of significant pleural fluid. IMPRESSION: Cardiac enlargement and diffuse pulmonary edema, likely reflecting congestive heart failure Electronically Signed   By: Aletta Edouard M.D.   On: 12/24/2015 22:21    EKG: sinus tachycardia, 111 bpm, left axis deviation, poor R wave progression, LVH, old inferior and anterior infarct  Weights: Filed Weights   12/24/15 2134  12/25/15 0020 12/25/15 0300  Weight: 190 lb (86.183 kg) 190 lb (86.183 kg) 193 lb 2 oz (87.6 kg)     Physical Exam: Blood pressure 137/100, pulse 85, temperature 98.4 F (36.9 C), temperature source Oral, resp. rate 20, height 5\' 6"  (1.676 m), weight 193 lb 2 oz (87.6 kg), SpO2 93 %. Body mass index is 31.19 kg/(m^2). General: Well developed, well nourished, in no acute distress. Head: Normocephalic, atraumatic, sclera non-icteric, no xanthomas, nares are without discharge.  Neck: Negative for carotid bruits. JVD elevated approximately 7 cm. Lungs: Bilateral crackles. Breathing is unlabored. Heart: RRR with S1 S2. No murmurs, rubs, or gallops appreciated. Abdomen: Soft, non-tender, non-distended with normoactive bowel sounds. No hepatomegaly. No rebound/guarding. No obvious abdominal masses. Msk:  Strength and tone appear normal for age. Extremities: No clubbing or cyanosis. No edema.  Distal pedal pulses are 2+ and equal bilaterally. Neuro: Alert and oriented X 3. No facial asymmetry. No focal deficit. Moves all extremities  spontaneously. Psych:  Responds to questions appropriately with a normal affect.    Assessment and Plan: 45 y.o. male with h/o CAD s/p anterior wall ST elevation MI on 05/03/2015 s/p PCI/DES to mid LAD at [facility] with residual 50% stenosis of the distal RCA, otherwise no evidence of CAD seen at time of cardiac cath, ischemic cardiomyopathy with EF of 20-25% by echo, HTN, prior tobacco abuse, and obesity who presented to [facility] on 4/4 with acute SOB and was found to have acute on chronic combined CHF, malignant HTN, and pulmonary edema.   1. Acute respiratory distress with hypoxia: -On nasal cannula currently, wean as able -In the setting of acute on chronic combined CHF  2. Acute on chronic combined CHF: -Presented with BP 220/143, subsequently has improved with IV labetalol and nitro gtts, as well as lisinopril, Lopressor, and spironolactone  -He has diuresed 2.25 L for the  admission to date -Given soft BPs currently on the above gtts, into the 0000000 systolic would taper off gtts and manage with PO medications at this time -Would change Lopressor back to his home Coreg given his cardiomyopathy of 25% -Continue aggressive IV diuresis with Lasix 80 mg bid at this time given stable renal function with KCl repletion to maintain potassium of 4.0 -Continue spironolactone  -Change lisinopril to losartan with plan to change to Entresto in 36 hours given his cardiomyopathy  -Check echo to assess EF, wall motion, and right-sided pressures -Prior EF 25% back in August 2016, he reports compliance with his medications. If his EF remains below 35% at this time, and he has been on adequate HF regimen would have him seen by EP for possible ICD -Ok for 2A   3. CAD s/p PCI as above: -No symptoms of angina -Continue aspirin 81 mg and Plavix 75 mg daily -Coreg as above -Lipitor -Troponin negative x 2 to date  3. Malignant HTN: -Exacerbating #1 -Improved -As above -Needs to check BP at home  4. Tobacco abuse: -Cessation advised - 3 min spent in counseling.  5. Obesity: -Weight loss advised  Melvern Banker, PA-C Pager: [phone] 12/25/2015, 8:06 AM

## 2015-12-25 NOTE — Progress Notes (Signed)
[place] Progress Note Patient Name: Logen Kyne DOB: [birth year] MRN: [id number]   Date of Service  12/25/2015  HPI/Events of Note  5 beat self resolvd vt Got diuresis  eICU Interventions  Get mag mphos supp k      Intervention Category Intermediate Interventions: Arrhythmia - evaluation and management  Treshon Stannard J. 12/25/2015, 5:56 AM

## 2015-12-25 NOTE — Progress Notes (Signed)
1000 tolerated weaning and d/c of NTG drip and Labetalol drip.

## 2015-12-25 NOTE — Progress Notes (Signed)
[place] at [place] NAME: Jeff Cobb    MR#:  [id number]  DATE OF BIRTH:  [birth year]  SUBJECTIVE:  CHIEF COMPLAINT:   Chief Complaint  Patient presents with  . Shortness of Breath  . Cough  feels much better  REVIEW OF SYSTEMS:  Review of Systems  Constitutional: Negative for fever, weight loss, malaise/fatigue and diaphoresis.  HENT: Negative for ear discharge, ear pain, hearing loss, nosebleeds, sore throat and tinnitus.   Eyes: Negative for blurred vision and pain.  Respiratory: Positive for cough and shortness of breath. Negative for hemoptysis and wheezing.   Cardiovascular: Negative for chest pain, palpitations, orthopnea and leg swelling.  Gastrointestinal: Negative for heartburn, nausea, vomiting, abdominal pain, diarrhea, constipation and blood in stool.  Genitourinary: Negative for dysuria, urgency and frequency.  Musculoskeletal: Negative for myalgias and back pain.  Skin: Negative for itching and rash.  Neurological: Negative for dizziness, tingling, tremors, focal weakness, seizures, weakness and headaches.  Psychiatric/Behavioral: Negative for depression. The patient is not nervous/anxious.     DRUG ALLERGIES:  No Known Allergies VITALS:  Blood pressure 110/75, pulse 84, temperature 98.7 F (37.1 C), temperature source Oral, resp. rate 15, height 5\' 6"  (1.676 m), weight 87.6 kg (193 lb 2 oz), SpO2 95 %. PHYSICAL EXAMINATION:  Physical Exam  Constitutional: He is oriented to person, place, and time and well-developed, well-nourished, and in no distress.  HENT:  Head: Normocephalic and atraumatic.  Eyes: Conjunctivae and EOM are normal. Pupils are equal, round, and reactive to light.  Neck: Normal range of motion. Neck supple. No tracheal deviation present. No thyromegaly present.  Cardiovascular: Normal rate, regular rhythm and normal heart sounds.   Pulmonary/Chest: Effort normal and breath sounds normal. No  respiratory distress. He has no wheezes. He exhibits no tenderness.  Abdominal: Soft. Bowel sounds are normal. He exhibits no distension. There is no tenderness.  Musculoskeletal: Normal range of motion.  Neurological: He is alert and oriented to person, place, and time. No cranial nerve deficit.  Skin: Skin is warm and dry. No rash noted.  Psychiatric: Mood and affect normal.   LABORATORY PANEL:   CBC  Recent Labs Lab 12/24/15 2153  WBC 14.1*  HGB 13.8  HCT 39.7*  PLT 298   ------------------------------------------------------------------------------------------------------------------ Chemistries   Recent Labs Lab 12/24/15 2153 12/25/15 0338  NA 138 137  K 3.4* 3.6  CL 106 102  CO2 23 27  GLUCOSE 107* 113*  BUN 14 15  CREATININE 1.14 1.18  CALCIUM 9.4 9.2  MG  --  1.8  AST 33  --   ALT 36  --   ALKPHOS 61  --   BILITOT 1.8*  --    RADIOLOGY:  Dg Chest Portable 1 View  12/24/2015  CLINICAL DATA:  Shortness of breath. History of myocardial infarction and coronary artery disease. EXAM: PORTABLE CHEST 1 VIEW COMPARISON:  None. FINDINGS: The heart is moderately enlarged. There is evidence of pulmonary edema diffusely throughout both lungs without evidence of significant pleural fluid. IMPRESSION: Cardiac enlargement and diffuse pulmonary edema, likely reflecting congestive heart failure Electronically Signed   By: Aletta Edouard M.D.   On: 12/24/2015 22:21   ASSESSMENT AND PLAN:  45 y.o. male with h/o CAD s/p anterior wall ST elevation MI on 05/03/2015 s/p PCI/DES to mid LAD at [facility] with residual 50% stenosis of the distal RCA,  ischemic cardiomyopathy with EF of 20-25% by echo, HTN, prior tobacco abuse, and obesity admitted  with acute SOB and was found to have acute on chronic combined CHF, malignant HTN, and pulmonary edema.   1. Acute respiratory distress with hypoxia: - likely due to flash pulmo edema due to uncontrolled BP - now on [place] O2  2. Acute on chronic  combined CHF: - Old echo showed EF 25%, repeat pending - May benefit from AICD if EF still low - continue Coreg, Lasix, Lisinopril, spironolactone  3. CAD s/p PCI as above: -No symptoms of angina -Continue aspirin 81 mg and Plavix 75 mg + B-blocker and Statin  4. Uncontrolled HTN - Likely leading to flash pulmo edema  5. Hypokalemia: replete and recheck    All the records are reviewed and case discussed with Care Management/Social Worker. Management plans discussed with the patient, family and they are in agreement.  CODE STATUS: FULL CODE  TOTAL TIME TAKING CARE OF THIS PATIENT: 35 minutes.   More than 50% of the time was spent in counseling/coordination of care: YES  POSSIBLE D/C IN 1-2 DAYS, DEPENDING ON CLINICAL CONDITION.   [facility], Avraham Benish M.D on 12/25/2015 at 12:37 PM  Between 7am to 6pm - Pager - [phone]  After 6pm go to [email or web address] - password [email or web address] [place] [facility]  Office  [phone]  CC: Primary care physician; Pcp Not In System  Note: This dictation was prepared with Dragon dictation along with smaller phrase technology. Any transcriptional errors that result from this process are unintentional.

## 2015-12-25 NOTE — Progress Notes (Signed)
Patient transferred to floor. Educated on safety. No complaints at this time. Patient resting comfortably with visitor at bedside. Telemetry box 40-11 verified with CCMD and Sonia Baller NT. Will continue to monitor

## 2015-12-25 NOTE — Progress Notes (Signed)
Received call from [place].  Pt with 5 beat run V-Tach.  Informed e-Link.

## 2015-12-25 NOTE — Progress Notes (Signed)
eLink Physician-Brief Progress Note Patient Name: Burnell Haniff DOB: 06/05/1971 MRN: [id number]   Date of Service  12/25/2015  HPI/Events of Note  htN EMERGENCY, HEADACHE, NONFOCAL EXAMINATION  eICU Interventions  Add labetalol drip, in order to reduce NTG which is causing headaches and is NOT an optimal agent to control BP MAP goal not listed. Highest MAP on admission 202/110, so goal is 25% reduction = MAP 100-110 D.w rn     Intervention Category Major Interventions: Hypertension - evaluation and management  Arrow Emmerich J. 12/25/2015, 3:51 AM

## 2015-12-25 NOTE — Progress Notes (Signed)
1505 report called to [employer] on 2A

## 2015-12-26 ENCOUNTER — Telehealth: Payer: Self-pay

## 2015-12-26 DIAGNOSIS — I5023 Acute on chronic systolic (congestive) heart failure: Secondary | ICD-10-CM

## 2015-12-26 LAB — BASIC METABOLIC PANEL
Anion gap: 9 (ref 5–15)
BUN: 21 mg/dL — ABNORMAL HIGH (ref 6–20)
CO2: 25 mmol/L (ref 22–32)
Calcium: 9.2 mg/dL (ref 8.9–10.3)
Chloride: 102 mmol/L (ref 101–111)
Creatinine, Ser: 1.26 mg/dL — ABNORMAL HIGH (ref 0.61–1.24)
GFR calc Af Amer: >60 mL/min (ref >60)
GFR calc non Af Amer: >60 mL/min (ref >60)
Glucose, Bld: 94 mg/dL (ref 65–99)
Potassium: 3.2 mmol/L — ABNORMAL LOW (ref 3.5–5.1)
Sodium: 136 mmol/L (ref 135–145)

## 2015-12-26 LAB — CBC
HCT: 40.2 % (ref 40.0–52.0)
Hemoglobin: 14.3 g/dL (ref 13.0–18.0)
MCH: 33.8 pg (ref 26.0–34.0)
MCHC: 35.5 g/dL (ref 32.0–36.0)
MCV: 95.1 fL (ref 80.0–100.0)
Platelets: 247 10*3/uL (ref 150–440)
RBC: 4.22 MIL/uL — ABNORMAL LOW (ref 4.40–5.90)
RDW: 14.5 % (ref 11.5–14.5)
WBC: 7.8 10*3/uL (ref 3.8–10.6)

## 2015-12-26 LAB — ECHOCARDIOGRAM COMPLETE
Height: 66 in
Weight: 3052.8 oz

## 2015-12-26 MED ORDER — FUROSEMIDE 40 MG PO TABS: 40.0000 mg | ORAL_TABLET | Freq: Every day | ORAL | Status: DC

## 2015-12-26 MED ORDER — POTASSIUM CHLORIDE CRYS ER 20 MEQ PO TBCR
40.0000 meq | EXTENDED_RELEASE_TABLET | Freq: Once | ORAL | Status: AC
  Administered 2015-12-26: 40 meq via ORAL
  Filled 2015-12-26: qty 2

## 2015-12-26 MED ORDER — SPIRONOLACTONE 25 MG PO TABS: 25.0000 mg | ORAL_TABLET | Freq: Every day | ORAL | Status: DC

## 2015-12-26 MED ORDER — SPIRONOLACTONE 50 MG PO TABS: 50.0000 mg | ORAL_TABLET | Freq: Every day | ORAL | Status: DC

## 2015-12-26 NOTE — Progress Notes (Signed)
Pt discharged to home via wc.  Instructions  given to pt.  Questions answered.  No distress.  

## 2015-12-26 NOTE — Progress Notes (Signed)
SUBJECTIVE:  The patient feels significantly better with improved dyspnea. Very good to her urine output with IV furosemide. No chest pain.   Filed Vitals:   12/25/15 2120 12/26/15 0442 12/26/15 0500 12/26/15 0744  BP: 115/86 128/88  130/89  Pulse: 81 70  74  Temp: 98.1 F (36.7 C) 98.3 F (36.8 C)  98.3 F (36.8 C)  TempSrc: Oral Oral  Oral  Resp: 20 20  17   Height:      Weight:   189 lb 4.8 oz (85.866 kg)   SpO2: 98% 97%  96%    Intake/Output Summary (Last 24 hours) at 12/26/15 1028 Last data filed at 12/26/15 0900  Gross per 24 hour  Intake    240 ml  Output   1050 ml  Net   -810 ml    LABS: Basic Metabolic Panel:  Recent Labs  12/25/15 0338 12/26/15 0514  NA 137 136  K 3.6 3.2*  CL 102 102  CO2 27 25  GLUCOSE 113* 94  BUN 15 21*  CREATININE 1.18 1.26*  CALCIUM 9.2 9.2  MG 1.8  --   PHOS 4.7*  --    Liver Function Tests:  Recent Labs  12/24/15 2153  AST 33  ALT 36  ALKPHOS 61  BILITOT 1.8*  PROT 7.5  ALBUMIN 4.0   No results for input(s): LIPASE, AMYLASE in the last 72 hours. CBC:  Recent Labs  12/24/15 2153 12/26/15 0514  WBC 14.1* 7.8  HGB 13.8 14.3  HCT 39.7* 40.2  MCV 93.9 95.1  PLT 298 247   Cardiac Enzymes:  Recent Labs  12/25/15 0338 12/25/15 0920 12/25/15 1501  TROPONINI <0.03 <0.03 0.04*   BNP: Invalid input(s): POCBNP D-Dimer: No results for input(s): DDIMER in the last 72 hours. Hemoglobin A1C: No results for input(s): HGBA1C in the last 72 hours. Fasting Lipid Panel: No results for input(s): CHOL, HDL, LDLCALC, TRIG, CHOLHDL, LDLDIRECT in the last 72 hours. Thyroid Function Tests: No results for input(s): TSH, T4TOTAL, T3FREE, THYROIDAB in the last 72 hours.  Invalid input(s): FREET3 Anemia Panel: No results for input(s): VITAMINB12, FOLATE, FERRITIN, TIBC, IRON, RETICCTPCT in the last 72 hours.   PHYSICAL EXAM General: Well developed, well nourished, in no acute distress HEENT:  Normocephalic and  atramatic Neck:  No JVD.  Lungs: Clear bilaterally to auscultation and percussion. Heart: HRRR . Normal S1 and S2 without gallops or murmurs.  Abdomen: Bowel sounds are positive, abdomen soft and non-tender  Msk:  Back normal, normal gait. Normal strength and tone for age. Extremities: No clubbing, cyanosis or edema.   Neuro: Alert and oriented X 3. Psych:  Good affect, responds appropriately  TELEMETRY: Reviewed telemetry pt in normal sinus rhythm:  ASSESSMENT AND PLAN:  1. Acute on chronic systolic heart failure :  The patient looks significantly better with diuresis and he appears to be euvolemic. He is ready for discharge today. The patient can be discharged home on his home medications but will add furosemide 40 mg once daily and decrease spironolactone to 25 mg once daily.  - We'll arrange follow-up in our clinic in one to 2 weeks. Will need BMP upon follow up.    3. CAD s/p PCI as above: -No symptoms of angina -Continue aspirin 81 mg and Plavix 75 mg daily -Coreg as above -Lipitor  negative troponin.  3. Malignant HTN: -Exacerbating #1 -Improved - Likely in the setting of volume overload. Blood pressure is now normal.  4. Tobacco abuse: -Cessation advised -  Kathlyn Sacramento, MD, [facility] 12/26/2015 10:28 AM

## 2015-12-26 NOTE — Telephone Encounter (Signed)
-----   Message from Blain Pais sent at 12/26/2015 10:07 AM EDT ----- Regarding: tcm/ph 4/21 1:30 Chrisppher Sharolyn Douglas, NP

## 2015-12-26 NOTE — Telephone Encounter (Signed)
Attempted to contact pt for TCM call. No answer, voicemail box not set up.

## 2015-12-26 NOTE — Telephone Encounter (Signed)
Patient contacted regarding discharge from [facility] on 12/26/15.  Patient understands to follow up with provider Ignacia Bayley, NP on 01/10/16 at 1:30 at [facility]. Patient understands discharge instructions? yes Patient understands medications and regiment? yes Patient understands to bring all medications to this visit? yes  Pt reports that he did not get all of his meds, as the pharmacy did not have Plavix, Lipitor or nitro ready when he stopped by.  He will p/u remaining rxs tonight. Pt is planning on going to work tonight, 3rd shift.  He states that he is feeling much better, has been "just laying around all day" and ready to return to work.

## 2015-12-26 NOTE — Discharge Instructions (Signed)
Heart Failure Clinic appointment on January 14, 2016 at 9:00am with Darylene Price, [place]. Please call [phone] to reschedule.   Pulmonary Edema Pulmonary edema is abnormal fluid buildup in the lungs that can make it hard to breathe. HOME CARE  Talk to your doctor about an exercise program.  Eat a healthy diet:  Eat fresh fruits, vegetables, and lean meats.  Limit high fat and salty foods.  Avoid processed, canned, or fried foods.  Avoid fast food.  Follow your doctor's advice about taking medicine and recording the medicine you take.  Follow your doctor's advice about keeping a record of your weight.  Talk to your doctor about keeping track of your blood pressure.  Do not smoke.  Do not use nicotine patches or nicotine gum.  Make a follow-up appointment with your doctor.  Ask your doctor for a copy of your latest heart tracing (ECG) and keep a copy with you at all times. GET HELP RIGHT AWAY IF:   You have chest pain. THIS IS AN EMERGENCY. Do not wait to see if the pain will go away. Call for local emergency medical help. Do not drive yourself to the hospital.  You have sweating, feel sick to your stomach (nauseous), or are experiencing shortness of breath.  Your weight increases more than your doctor tells you it should.  You start to have shortness of breath.  You notice more swelling in your hands, feet, ankles, or belly.  You have dizziness, blurred vision, headache, or unsteadiness that does not go away.  You cough up bloody spit.  You have a cough that does not go away.  You are unable to sleep because it is hard to breathe.  You begin to feel a "jumping" or "fluttering" sensation (palpitations) in the chest that is unusual for you. MAKE SURE YOU:   Understand these instructions.  Will watch your condition.  Will get help right away if you are not doing well or get worse.   This information is not intended to replace advice given to you by your health  care provider. Make sure you discuss any questions you have with your health care provider.   Document Released: 08/26/2009 Document Revised: 09/12/2013 Document Reviewed: 05/15/2013 Elsevier Interactive Patient Education [employer].

## 2015-12-26 NOTE — Care Management (Signed)
Spoke with patient and he is in agreement with CM making initial new patient appointment with [facility]. Appointment: Fredia Sorrow, NP, May 3 at 10:00 am. Patient updated. Placed on discharge instructions. Reviewed medications. All are low cost. Encouraged him to use the [employer] list. DC today home with self care.

## 2015-12-26 NOTE — Progress Notes (Signed)
Initial Heart Failure Clinic appointment scheduled for January 14, 2016 at 9:00am. Thank you for the referral.

## 2015-12-30 NOTE — Discharge Summary (Signed)
[facility] [place] NAME: Jeff Cobb    MR#:  [id number]  DATE OF BIRTH:  12-20-1970  DATE OF ADMISSION:  12/24/2015 ADMITTING PHYSICIAN: Saundra Shelling, MD  DATE OF DISCHARGE: 12/26/2015 11:00 AM  PRIMARY CARE PHYSICIAN: Pcp Not In System    ADMISSION DIAGNOSIS:  Acute pulmonary edema (HCC) [J81.0] Acute respiratory failure with hypoxia (HCC) [J96.01]  DISCHARGE DIAGNOSIS:  Principal Problem:   Acute pulmonary edema (HCC) Active Problems:   CHF (congestive heart failure) ([place])   SECONDARY DIAGNOSIS:   Past Medical History  Diagnosis Date  . CAD (coronary artery disease)     a. anterior wall STEMI 04/2015: LM no dz, mLAD occluded s/p PCI/DES, LCx no dz, dRCA 50%  . Hypertension   . Ischemic cardiomyopathy     a. echo 8/12/2-16: EF 20-25%, LVH, basal anterior, mid anterior, apical anterior, mid anteroseptal, apical septal and apical hypokinesis. mildly dilated LA; b. echo 05/06/2015: Ef 25%, LVH, trivial MR, dilated thoracic aorta, anteroapical and apical akinesis and severely decreased contraction overall    HOSPITAL COURSE:  45 y.o. male with h/o CAD s/p anterior wall ST elevation MI on 05/03/2015 s/p PCI/DES to mid LAD at [facility] with residual 50% stenosis of the distal RCA, ischemic cardiomyopathy with EF of 20-25% by echo, HTN, prior tobacco abuse, and obesity admitted with acute SOB and was found to have acute on chronic combined CHF, malignant HTN, and pulmonary edema.   1. Acute respiratory distress with hypoxia: - likely due to flash pulmo edema due to uncontrolled BP - now resolved and off [place] O2  2. Acute on chronic combined CHF: - Old echo showed EF 25%, repeat showed EF 30-35% - On Coreg, Lasix, Lisinopril, spironolactone  3. CAD s/p PCI in Aug 2016 -No symptoms of angina -Continue aspirin 81 mg and Plavix 75 mg + B-blocker and Statin  4. Uncontrolled HTN - Likely leading to flash pulmo edema - now  improved  5. Hypokalemia: repleted  Feeling much better. Agreeable with D/C plans.  DISCHARGE CONDITIONS:   stable  CONSULTS OBTAINED:  Treatment Team:  Leonie Man, MD  DRUG ALLERGIES:  No Known Allergies  DISCHARGE MEDICATIONS:   Discharge Medication List as of 12/26/2015 10:32 AM    START taking these medications   Details  furosemide (LASIX) 40 MG tablet Take 1 tablet (40 mg total) by mouth daily., Starting 12/26/2015, Until Discontinued, Normal      CONTINUE these medications which have CHANGED   Details  spironolactone (ALDACTONE) 25 MG tablet Take 1 tablet (25 mg total) by mouth daily., Starting 12/26/2015, Until Discontinued, Normal      CONTINUE these medications which have NOT CHANGED   Details  acetaminophen (TYLENOL) 325 MG tablet Take 650 mg by mouth every 6 (six) hours as needed., Until Discontinued, Historical Med    aspirin EC 81 MG tablet Take 81 mg by mouth daily., Until Discontinued, Historical Med    atorvastatin (LIPITOR) 80 MG tablet Take 80 mg by mouth daily., Until Discontinued, Historical Med    carvedilol (COREG) 25 MG tablet Take 25 mg by mouth 2 (two) times daily with a meal., Until Discontinued, Historical Med    clopidogrel (PLAVIX) 75 MG tablet Take 75 mg by mouth daily., Until Discontinued, Historical Med    lisinopril (PRINIVIL,ZESTRIL) 20 MG tablet Take 20 mg by mouth daily., Until Discontinued, Historical Med    nitroGLYCERIN (NITROSTAT) 0.4 MG SL tablet Place 0.4 mg under the  tongue every 5 (five) minutes as needed for chest pain., Until Discontinued, Historical Med         DISCHARGE INSTRUCTIONS:    DIET:  Cardiac diet  DISCHARGE CONDITION:  Good  ACTIVITY:  Activity as tolerated  OXYGEN:  Home Oxygen: No.   Oxygen Delivery: room air  DISCHARGE LOCATION:  home   If you experience worsening of your admission symptoms, develop shortness of breath, life threatening emergency, suicidal or homicidal thoughts you must  seek medical attention immediately by calling 911 or calling your MD immediately  if symptoms less severe.  You Must read complete instructions/literature along with all the possible adverse reactions/side effects for all the Medicines you take and that have been prescribed to you. Take any new Medicines after you have completely understood and accpet all the possible adverse reactions/side effects.   Please note  You were cared for by a hospitalist during your hospital stay. If you have any questions about your discharge medications or the care you received while you were in the hospital after you are discharged, you can call the unit and asked to speak with the hospitalist on call if the hospitalist that took care of you is not available. Once you are discharged, your primary care physician will handle any further medical issues. Please note that NO REFILLS for any discharge medications will be authorized once you are discharged, as it is imperative that you return to your primary care physician (or establish a relationship with a primary care physician if you do not have one) for your aftercare needs so that they can reassess your need for medications and monitor your lab values.    On the day of Discharge:  VITAL SIGNS:  Blood pressure 130/89, pulse 74, temperature 98.3 F (36.8 C), temperature source Oral, resp. rate 17, height 5\' 6"  (1.676 m), weight 85.866 kg (189 lb 4.8 oz), SpO2 96 %.  PHYSICAL EXAMINATION:  GENERAL:  45 y.o.-year-old patient lying in the bed with no acute distress.  EYES: Pupils equal, round, reactive to light and accommodation. No scleral icterus. Extraocular muscles intact.  HEENT: Head atraumatic, normocephalic. Oropharynx and nasopharynx clear.  NECK:  Supple, no jugular venous distention. No thyroid enlargement, no tenderness.  LUNGS: Normal breath sounds bilaterally, no wheezing, rales,rhonchi or crepitation. No use of accessory muscles of respiration.   CARDIOVASCULAR: S1, S2 normal. No murmurs, rubs, or gallops.  ABDOMEN: Soft, non-tender, non-distended. Bowel sounds present. No organomegaly or mass.  EXTREMITIES: No pedal edema, cyanosis, or clubbing.  NEUROLOGIC: Cranial nerves II through XII are intact. Muscle strength 5/5 in all extremities. Sensation intact. Gait not checked.  PSYCHIATRIC: The patient is alert and oriented x 3.  SKIN: No obvious rash, lesion, or ulcer.  DATA REVIEW:   CBC  Recent Labs Lab 12/26/15 0514  WBC 7.8  HGB 14.3  HCT 40.2  PLT 247    Chemistries   Recent Labs Lab 12/24/15 2153 12/25/15 0338 12/26/15 0514  NA 138 137 136  K 3.4* 3.6 3.2*  CL 106 102 102  CO2 23 27 25   GLUCOSE 107* 113* 94  BUN 14 15 21*  CREATININE 1.14 1.18 1.26*  CALCIUM 9.4 9.2 9.2  MG  --  1.8  --   AST 33  --   --   ALT 36  --   --   ALKPHOS 61  --   --   BILITOT 1.8*  --   --     Follow-up Information  Follow up with Ida Rogue, MD. Schedule an appointment as soon as possible for a visit on 02/25/2016.   Specialty:  Cardiology   Why:  Appointmnet made for 01/10/2016 at 1:30   Contact information:   [place] [address] [phone]       Follow up with Alisa Graff, FNP. Go on 01/14/2016.   Specialty:  Family Medicine   Why:  at 9:00am , to the [place] information:   [place] [address] [address] [phone]       Follow up with Amy L Krebs, NP. Go on 01/22/2016.   Specialty:  Family Medicine   Why:  New Patient Appointment- [facility] at [address]   Contact information:   [place] [address] [phone]       Management plans discussed with the patient, family and they are in agreement.  CODE STATUS:  Code Status History    Date Active Date Inactive Code Status Order ID Comments User Context   12/25/2015  3:02 AM 12/26/2015  2:01 PM Full Code [id number]  Saundra Shelling, MD Inpatient       TOTAL TIME TAKING CARE OF THIS PATIENT: 45 minutes.    [facility], Sai Zinn M.D on 12/30/2015 at 8:51 AM  Between 7am to 6pm - Pager - [phone]  After 6pm go to [email or web address] - password [email or web address] [place] [facility]  Office  [phone]  CC: Primary care physician; Pcp Not In System   Note: This dictation was prepared with Dragon dictation along with smaller phrase technology. Any transcriptional errors that result from this process are unintentional.

## 2016-01-10 ENCOUNTER — Encounter: Payer: PRIVATE HEALTH INSURANCE | Admitting: Nurse Practitioner

## 2016-01-10 ENCOUNTER — Encounter: Payer: Self-pay | Admitting: Nurse Practitioner

## 2016-01-10 DIAGNOSIS — I255 Ischemic cardiomyopathy: Secondary | ICD-10-CM | POA: Insufficient documentation

## 2016-01-10 DIAGNOSIS — I119 Hypertensive heart disease without heart failure: Secondary | ICD-10-CM | POA: Insufficient documentation

## 2016-01-10 DIAGNOSIS — E785 Hyperlipidemia, unspecified: Secondary | ICD-10-CM | POA: Insufficient documentation

## 2016-01-10 DIAGNOSIS — E782 Mixed hyperlipidemia: Secondary | ICD-10-CM | POA: Insufficient documentation

## 2016-01-14 ENCOUNTER — Telehealth: Payer: Self-pay | Admitting: Family

## 2016-01-14 ENCOUNTER — Ambulatory Visit: Payer: PRIVATE HEALTH INSURANCE | Admitting: Family

## 2016-01-14 NOTE — Telephone Encounter (Signed)
Patient did not show for his initial Heart Failure Clinic appointment on 01/14/16. Will attempt to reschedule.

## 2016-01-22 ENCOUNTER — Ambulatory Visit: Payer: Self-pay | Admitting: Family Medicine

## 2016-01-23 ENCOUNTER — Encounter: Payer: PRIVATE HEALTH INSURANCE | Admitting: Nurse Practitioner

## 2016-02-04 ENCOUNTER — Encounter: Payer: Self-pay | Admitting: Nurse Practitioner

## 2016-02-04 ENCOUNTER — Ambulatory Visit (INDEPENDENT_AMBULATORY_CARE_PROVIDER_SITE_OTHER): Payer: PRIVATE HEALTH INSURANCE | Admitting: Nurse Practitioner

## 2016-02-04 ENCOUNTER — Ambulatory Visit (INDEPENDENT_AMBULATORY_CARE_PROVIDER_SITE_OTHER): Payer: PRIVATE HEALTH INSURANCE | Admitting: Family Medicine

## 2016-02-04 ENCOUNTER — Encounter: Payer: Self-pay | Admitting: Family Medicine

## 2016-02-04 VITALS — Ht 67.0 in | Wt 200.8 lb

## 2016-02-04 VITALS — BP 146/100 | HR 87 | Temp 98.9°F | Resp 16 | Ht 67.0 in | Wt 200.6 lb

## 2016-02-04 DIAGNOSIS — E785 Hyperlipidemia, unspecified: Secondary | ICD-10-CM

## 2016-02-04 DIAGNOSIS — Z720 Tobacco use: Secondary | ICD-10-CM

## 2016-02-04 DIAGNOSIS — I2510 Atherosclerotic heart disease of native coronary artery without angina pectoris: Secondary | ICD-10-CM

## 2016-02-04 DIAGNOSIS — I110 Hypertensive heart disease with heart failure: Secondary | ICD-10-CM

## 2016-02-04 DIAGNOSIS — I255 Ischemic cardiomyopathy: Secondary | ICD-10-CM | POA: Diagnosis not present

## 2016-02-04 DIAGNOSIS — Z7189 Other specified counseling: Secondary | ICD-10-CM

## 2016-02-04 DIAGNOSIS — F17200 Nicotine dependence, unspecified, uncomplicated: Secondary | ICD-10-CM | POA: Insufficient documentation

## 2016-02-04 DIAGNOSIS — I10 Essential (primary) hypertension: Secondary | ICD-10-CM | POA: Diagnosis not present

## 2016-02-04 DIAGNOSIS — I5022 Chronic systolic (congestive) heart failure: Secondary | ICD-10-CM

## 2016-02-04 DIAGNOSIS — R7989 Other specified abnormal findings of blood chemistry: Secondary | ICD-10-CM | POA: Diagnosis not present

## 2016-02-04 DIAGNOSIS — R945 Abnormal results of liver function studies: Secondary | ICD-10-CM

## 2016-02-04 DIAGNOSIS — Z7689 Persons encountering health services in other specified circumstances: Secondary | ICD-10-CM

## 2016-02-04 MED ORDER — SPIRONOLACTONE 25 MG PO TABS: 25.0000 mg | ORAL_TABLET | Freq: Every day | ORAL | Status: DC

## 2016-02-04 MED ORDER — AMLODIPINE BESYLATE 5 MG PO TABS
5.0000 mg | ORAL_TABLET | Freq: Every day | ORAL | Status: DC
Start: 2016-02-04 — End: 2017-01-28

## 2016-02-04 MED ORDER — LISINOPRIL 20 MG PO TABS: 20.0000 mg | ORAL_TABLET | Freq: Every day | ORAL | Status: DC

## 2016-02-04 MED ORDER — FUROSEMIDE 40 MG PO TABS: 40.0000 mg | ORAL_TABLET | Freq: Every day | ORAL | Status: DC

## 2016-02-04 MED ORDER — ATORVASTATIN CALCIUM 80 MG PO TABS: 80.0000 mg | ORAL_TABLET | Freq: Every day | ORAL | Status: DC

## 2016-02-04 NOTE — Assessment & Plan Note (Signed)
Continue carvedilol ol, lasix, aldactone, and ACE as prescribed by cardiology in previous hospitalization. Pt appears euvolemic today. Encouraged to keep follow-up appt with cardiology today.

## 2016-02-04 NOTE — Progress Notes (Signed)
Subjective:    Patient ID: Jeff Cobb, male    DOB: [birth year], 45 y.o.   MRN: [id number]  HPI: Jeff Cobb is a 45 y.o. male presenting on 02/04/2016 for Establish Care   HPI  Pt presents to establish care today. Previous care provider was None- was seen at [facility]  It has been 9 months since His last PCP visit. Records from previous provider will be requested and reviewed. Current medical problems include:  CAD: Stent placed at [facility] last year in August- has not had regular follow-up with cardiology. Seeing Cardiology today at 11am.  CHF; last EF 30% in April 2017.  Hyperlipidemia: Atorvastatin 80mg  daily. No myalgias, leg pains, or muscle cramping.  Former smoker- quit in April.  Hypertension: checks at [facility] avg 130/87.  Not checking in AM- checks when he gets off work. Has taken BP medications this morning. No chest pain. No SOB. No visual changes. Usually takes medications first thing in the AM.   Health maintenance:  No family history prostate cancer.  No family history colon cancer. TDAP: Has been 10 years.  Pneumovax: Never had. Works in Dietitian.     Past Medical History  Diagnosis Date  . CAD (coronary artery disease)     a. 04/2015 Anterior STEMI/PCI: LM nl, LAD 137m (3.0x15 Xience DES), LCx nl, OM1-3 nl, RCA 50d, PDA nl.  . Hypertensive heart disease   . Ischemic cardiomyopathy     a. 04/2015 Echo: EF 25%, LVH, trivial MR, dilated thoracic aorta, anteroapical and apical akinesis and severely decreased contraction overall; c. 12/2015 Echo: EF 30-35%, mod conc LVH, mid-apicalanteroseptal, anterior, and apical AK, mildly dil LA.  Marland Kitchen Hyperlipidemia   . Tobacco abuse   . Chronic systolic CHF (congestive heart failure) ([place])     a. 12/2015 Echo: EF 30-35%.   Social History   Social History  . Marital Status: Married    Spouse Name: N/A  . Number of Children: N/A  . Years of Education: N/A   Occupational History  . unemployed    Social History Main  Topics  . Smoking status: Former Research scientist (life sciences)  . Smokeless tobacco: Not on file  . Alcohol Use: 0.0 oz/week    0 Standard drinks or equivalent per week  . Drug Use: Yes    Special: Marijuana  . Sexual Activity: Not on file   Other Topics Concern  . Not on file   Social History Narrative   Family History  Problem Relation Age of Onset  . Heart disease Father   . Diabetes Mellitus II Father    Current Outpatient Prescriptions on File Prior to Visit  Medication Sig  . acetaminophen (TYLENOL) 325 MG tablet Take 650 mg by mouth every 6 (six) hours as needed.  Marland Kitchen aspirin EC 81 MG tablet Take 81 mg by mouth daily.  . carvedilol (COREG) 25 MG tablet Take 25 mg by mouth 2 (two) times daily with a meal.  . clopidogrel (PLAVIX) 75 MG tablet Take 75 mg by mouth daily.  . nitroGLYCERIN (NITROSTAT) 0.4 MG SL tablet Place 0.4 mg under the tongue every 5 (five) minutes as needed for chest pain.   No current facility-administered medications on file prior to visit.    Review of Systems  Constitutional: Negative for fever and chills.  HENT: Negative.   Respiratory: Negative for chest tightness, shortness of breath and wheezing.   Cardiovascular: Negative for chest pain, palpitations and leg swelling.  Gastrointestinal: Negative for nausea, vomiting and  abdominal pain.  Endocrine: Negative.   Genitourinary: Negative for dysuria, urgency, discharge, penile pain and testicular pain.  Musculoskeletal: Negative for back pain, joint swelling and arthralgias.  Skin: Negative.   Neurological: Negative for dizziness, weakness, numbness and headaches.  Psychiatric/Behavioral: Negative for sleep disturbance and dysphoric mood.   Per HPI unless specifically indicated above     Objective:    BP 146/100 mmHg  Pulse 87  Temp(Src) 98.9 F (37.2 C) (Oral)  Resp 16  Ht 5\' 7"  (1.702 m)  Wt 200 lb 9.6 oz (90.992 kg)  BMI 31.41 kg/m2  Wt Readings from Last 3 Encounters:  02/04/16 200 lb 9.6 oz (90.992 kg)   12/26/15 189 lb 4.8 oz (85.866 kg)    Physical Exam  Constitutional: He is oriented to person, place, and time. He appears well-developed and well-nourished. No distress.  HENT:  Head: Normocephalic and atraumatic.  Neck: Neck supple. No thyromegaly present.  Cardiovascular: Normal rate, regular rhythm and normal heart sounds.  Exam reveals no gallop and no friction rub.   No murmur heard. Pulmonary/Chest: Effort normal and breath sounds normal. He has no wheezes.  Abdominal: Soft. Bowel sounds are normal. He exhibits no distension. There is no tenderness. There is no rebound.  Musculoskeletal: Normal range of motion. He exhibits no edema or tenderness.  Neurological: He is alert and oriented to person, place, and time. He has normal reflexes.  Skin: Skin is warm and dry. No rash noted. No erythema.  Psychiatric: He has a normal mood and affect. His behavior is normal. Thought content normal.   Results for orders placed or performed during the hospital encounter of 12/24/15  MRSA PCR Screening  Result Value Ref Range   MRSA by PCR NEGATIVE NEGATIVE  CBC  Result Value Ref Range   WBC 14.1 (H) 3.8 - 10.6 K/uL   RBC 4.23 (L) 4.40 - 5.90 MIL/uL   Hemoglobin 13.8 13.0 - 18.0 g/dL   HCT 39.7 (L) 40.0 - 52.0 %   MCV 93.9 80.0 - 100.0 fL   MCH 32.5 26.0 - 34.0 pg   MCHC 34.7 32.0 - 36.0 g/dL   RDW 14.9 (H) 11.5 - 14.5 %   Platelets 298 150 - 440 K/uL  Comprehensive metabolic panel  Result Value Ref Range   Sodium 138 135 - 145 mmol/L   Potassium 3.4 (L) 3.5 - 5.1 mmol/L   Chloride 106 101 - 111 mmol/L   CO2 23 22 - 32 mmol/L   Glucose, Bld 107 (H) 65 - 99 mg/dL   BUN 14 6 - 20 mg/dL   Creatinine, Ser 1.14 0.61 - 1.24 mg/dL   Calcium 9.4 8.9 - 10.3 mg/dL   Total Protein 7.5 6.5 - 8.1 g/dL   Albumin 4.0 3.5 - 5.0 g/dL   AST 33 15 - 41 U/L   ALT 36 17 - 63 U/L   Alkaline Phosphatase 61 38 - 126 U/L   Total Bilirubin 1.8 (H) 0.3 - 1.2 mg/dL   GFR calc non Af Amer >60 >60 mL/min     GFR calc Af Amer >60 >60 mL/min   Anion gap 9 5 - 15  Troponin I  Result Value Ref Range   Troponin I <0.03 <0.031 ng/mL  APTT  Result Value Ref Range   aPTT 28 24 - 36 seconds  Protime-INR  Result Value Ref Range   Prothrombin Time 14.1 11.4 - 15.0 seconds   INR 1.07   Brain natriuretic peptide (order if patient c/o  SOB ONLY)  Result Value Ref Range   B Natriuretic Peptide 1301.0 (H) 0.0 - 100.0 pg/mL  Basic metabolic panel  Result Value Ref Range   Sodium 137 135 - 145 mmol/L   Potassium 3.6 3.5 - 5.1 mmol/L   Chloride 102 101 - 111 mmol/L   CO2 27 22 - 32 mmol/L   Glucose, Bld 113 (H) 65 - 99 mg/dL   BUN 15 6 - 20 mg/dL   Creatinine, Ser 1.18 0.61 - 1.24 mg/dL   Calcium 9.2 8.9 - 10.3 mg/dL   GFR calc non Af Amer >60 >60 mL/min   GFR calc Af Amer >60 >60 mL/min   Anion gap 8 5 - 15  Troponin I  Result Value Ref Range   Troponin I <0.03 <0.031 ng/mL  Troponin I  Result Value Ref Range   Troponin I <0.03 <0.031 ng/mL  Troponin I  Result Value Ref Range   Troponin I 0.04 (H) <0.031 ng/mL  Glucose, capillary  Result Value Ref Range   Glucose-Capillary 115 (H) 65 - 99 mg/dL  Magnesium  Result Value Ref Range   Magnesium 1.8 1.7 - 2.4 mg/dL  Phosphorus  Result Value Ref Range   Phosphorus 4.7 (H) 2.5 - 4.6 mg/dL  Basic metabolic panel  Result Value Ref Range   Sodium 136 135 - 145 mmol/L   Potassium 3.2 (L) 3.5 - 5.1 mmol/L   Chloride 102 101 - 111 mmol/L   CO2 25 22 - 32 mmol/L   Glucose, Bld 94 65 - 99 mg/dL   BUN 21 (H) 6 - 20 mg/dL   Creatinine, Ser 1.26 (H) 0.61 - 1.24 mg/dL   Calcium 9.2 8.9 - 10.3 mg/dL   GFR calc non Af Amer >60 >60 mL/min   GFR calc Af Amer >60 >60 mL/min   Anion gap 9 5 - 15  CBC  Result Value Ref Range   WBC 7.8 3.8 - 10.6 K/uL   RBC 4.22 (L) 4.40 - 5.90 MIL/uL   Hemoglobin 14.3 13.0 - 18.0 g/dL   HCT 40.2 40.0 - 52.0 %   MCV 95.1 80.0 - 100.0 fL   MCH 33.8 26.0 - 34.0 pg   MCHC 35.5 32.0 - 36.0 g/dL   RDW 14.5 11.5 -  14.5 %   Platelets 247 150 - 440 K/uL  Echocardiogram  Result Value Ref Range   Weight 3052.8 oz   Height 66 in   BP 150/98 mmHg      Assessment & Plan:   Problem List Items Addressed This Visit      Cardiovascular and Mediastinum   Ischemic cardiomyopathy    Continue carvedilol ol, lasix, aldactone, and ACE as prescribed by cardiology in previous hospitalization. Pt appears euvolemic today. Encouraged to keep follow-up appt with cardiology today.       Relevant Medications   amLODipine (NORVASC) 5 MG tablet   furosemide (LASIX) 40 MG tablet   lisinopril (PRINIVIL,ZESTRIL) 20 MG tablet   spironolactone (ALDACTONE) 25 MG tablet   atorvastatin (LIPITOR) 80 MG tablet   Other Relevant Orders   Comprehensive metabolic panel   CAD (coronary artery disease)    S/p Stent placement in 04/2015. Continue plavix and aspirin. Pt will follow with cardiology.       Relevant Medications   amLODipine (NORVASC) 5 MG tablet   furosemide (LASIX) 40 MG tablet   lisinopril (PRINIVIL,ZESTRIL) 20 MG tablet   spironolactone (ALDACTONE) 25 MG tablet   atorvastatin (LIPITOR) 80 MG tablet  BP (high blood pressure) - Primary    Add amlodipine 5mg  for better BP control. Continue current regimen as started by Cardiology during previous hospitalization. Check CMET. Recheck 4 weeks. Commended pt on smoking cessation. Encourage DASH diet.       Relevant Medications   amLODipine (NORVASC) 5 MG tablet   furosemide (LASIX) 40 MG tablet   lisinopril (PRINIVIL,ZESTRIL) 20 MG tablet   spironolactone (ALDACTONE) 25 MG tablet   atorvastatin (LIPITOR) 80 MG tablet     Other   Hyperlipidemia    Continue atorvastatin. Recheck lipid panel.       Relevant Medications   amLODipine (NORVASC) 5 MG tablet   furosemide (LASIX) 40 MG tablet   lisinopril (PRINIVIL,ZESTRIL) 20 MG tablet   spironolactone (ALDACTONE) 25 MG tablet   atorvastatin (LIPITOR) 80 MG tablet   Other Relevant Orders   Lipid Profile    Abnormal LFTs    Check liver enzymes today.        Other Visit Diagnoses    Encounter to establish care           Meds ordered this encounter  Medications  . amLODipine (NORVASC) 5 MG tablet    Sig: Take 1 tablet (5 mg total) by mouth daily.    Dispense:  30 tablet    Refill:  11    Order Specific Question:  Supervising Provider    Answer:  Arlis Porta [phone]  . furosemide (LASIX) 40 MG tablet    Sig: Take 1 tablet (40 mg total) by mouth daily.    Dispense:  90 tablet    Refill:  3    Order Specific Question:  Supervising Provider    Answer:  Arlis Porta [phone]  . lisinopril (PRINIVIL,ZESTRIL) 20 MG tablet    Sig: Take 1 tablet (20 mg total) by mouth daily.    Dispense:  90 tablet    Refill:  3    Order Specific Question:  Supervising Provider    Answer:  Arlis Porta [phone]  . spironolactone (ALDACTONE) 25 MG tablet    Sig: Take 1 tablet (25 mg total) by mouth daily.    Dispense:  90 tablet    Refill:  3    Order Specific Question:  Supervising Provider    Answer:  Arlis Porta [phone]  . atorvastatin (LIPITOR) 80 MG tablet    Sig: Take 1 tablet (80 mg total) by mouth daily.    Dispense:  90 tablet    Refill:  3    Order Specific Question:  Supervising Provider    Answer:  Arlis Porta [phone]      Follow up plan: Return in about 4 weeks (around 03/03/2016) for BP check. Marland Kitchen

## 2016-02-04 NOTE — Progress Notes (Signed)
Office Visit    Patient Name: Jeff Cobb Date of Encounter: 02/04/2016  Primary Care Provider:  Leata Mouse, NP Primary Cardiologist:  Roni Bread, MD   Chief Complaint    45 year old male with prior history of anterior myocardial infarction and ischemic myopathy who presents for follow-up after recent hospitalization.  Past Medical History    Past Medical History  Diagnosis Date  . CAD (coronary artery disease)     a. 04/2015 Anterior STEMI/PCI: LM nl, LAD 180m (3.0x15 Xience DES), LCx nl, OM1-3 nl, RCA 50d, PDA nl.  . Hypertensive heart disease   . Ischemic cardiomyopathy     a. 04/2015 Echo: EF 25%, LVH, trivial MR, dilated thoracic aorta, anteroapical and apical akinesis and severely decreased contraction overall; c. 12/2015 Echo: EF 30-35%, mod conc LVH, mid-apicalanteroseptal, anterior, and apical AK, mildly dil LA.  Marland Kitchen Hyperlipidemia   . Tobacco abuse   . Chronic systolic CHF (congestive heart failure) ([place])     a. 12/2015 Echo: EF 30-35%.   Past Surgical History  Procedure Laterality Date  . Coronary angioplasty with stent placement      Allergies  No Known Allergies  History of Present Illness    45 year old male with prior history of coronary artery disease status post anterior wall ST elevation MI in August 2016 requiring drilling stent placement to the mid LAD. As result of this, he was left with LV dysfunction and an EF of 20-25% of the time. He also has a history of hypertension, hyperlipidemia, and tobacco abuse. He was followed at [facility] but then lost to follow-up. He was admitted to [facility] regional in early April with dyspnea and cough and was found to have acute pulmonary edema in the setting of marked it hypertension. He was aggressively diuresed and previous home medications were resumed and adjusted. With this, he had significant clinical improvement. Echo during hospitalization showed an EF of 30-35%. Troponins remain negative and it was not felt that he would  require further ischemic evaluation.  Since his discharge, he reports being compliant with his medications. He has quit smoking but admits to slipping up one day last week and smoking 1 cigarette. He has every intention to quit completely. He is careful with his salt intake and says his wife prepares just about all of his meals. He has been weighing himself daily at work and says his weight has been trending about 195 on that scale. He has not been experiencing any dyspnea on exertion, PND, orthopnea, dizziness, syncope, edema, early satiety, palpitations, or chest pain. He was seen in primary care clinic earlier this morning and his blood pressure was elevated. Amlodipine has been added to his regimen.  Home Medications    Prior to Admission medications   Medication Sig Start Date End Date Taking? Authorizing Provider  acetaminophen (TYLENOL) 325 MG tablet Take 650 mg by mouth every 6 (six) hours as needed.    Historical Provider, MD  amLODipine (NORVASC) 5 MG tablet Take 1 tablet (5 mg total) by mouth daily. 02/04/16   Amy Overton Mam, NP  aspirin EC 81 MG tablet Take 81 mg by mouth daily.    Historical Provider, MD  atorvastatin (LIPITOR) 80 MG tablet Take 1 tablet (80 mg total) by mouth daily. 02/04/16   Amy Overton Mam, NP  carvedilol (COREG) 25 MG tablet Take 25 mg by mouth 2 (two) times daily with a meal.    Historical Provider, MD  clopidogrel (PLAVIX) 75 MG tablet Take 75  mg by mouth daily.    Historical Provider, MD  furosemide (LASIX) 40 MG tablet Take 1 tablet (40 mg total) by mouth daily. 02/04/16   Amy Overton Mam, NP  lisinopril (PRINIVIL,ZESTRIL) 20 MG tablet Take 1 tablet (20 mg total) by mouth daily. 02/04/16   Amy Overton Mam, NP  nitroGLYCERIN (NITROSTAT) 0.4 MG SL tablet Place 0.4 mg under the tongue every 5 (five) minutes as needed for chest pain.    Historical Provider, MD  spironolactone (ALDACTONE) 25 MG tablet Take 1 tablet (25 mg total) by mouth daily. 02/04/16   Amy Overton Mam, NP    Review of Systems    Doing well since d/c.  He denies chest pain, palpitations, dyspnea, pnd, orthopnea, n, v, dizziness, syncope, edema, weight gain, or early satiety.  All other systems reviewed and are otherwise negative except as noted above.  Physical Exam    VS:  Ht 5\' 7"  (1.702 m)  Wt 200 lb 12.8 oz (91.082 kg)  BMI 31.44 kg/m2 , BMI Body mass index is 31.44 kg/(m^2). GEN: Well nourished, well developed, in no acute distress. HEENT: normal. Neck: Supple, no JVD, carotid bruits, or masses. Cardiac: RRR, no murmurs, rubs, or gallops. No clubbing, cyanosis, edema.  Radials/DP/PT 2+ and equal bilaterally.  Respiratory:  Respirations regular and unlabored, clear to auscultation bilaterally. GI: Soft, nontender, nondistended, BS + x 4. MS: no deformity or atrophy. Skin: warm and dry, no rash. Neuro:  Strength and sensation are intact. Psych: Normal affect.  Accessory Clinical Findings    ECG - Sinus rhythm, 81, left axis, left anterior fascicular block, prior inferior infarct and anterior infarct, LVH, lateral T-wave inversion-no acute ST or T changes.  Assessment & Plan    1.  Chronic systolic congestive heart failure/ischemic cardiomyopathy: Patient was recently hospitalized with hypertensive urgency and acute pulmonary edema in the setting of an EF of 30-35%. He was placed back on beta blocker, ACE inhibitor, spironolactone, and Lasix therapy and has been tolerating these well. He weighs himself daily and notes it to be in the 195 range. He has been watching his salt intake and notes that his wife prepares most of his meals.  We discussed the importance of daily weights, sodium restriction, medication compliance, and symptom reporting and he verbalizes understanding. He is scheduled for follow-up complete metabolic panel today.  2. Coronary artery disease: Status post prior anterior myocardial infarction with stenting of the LAD at [facility] in 2016. Troponins were negative  on recent admission. He has not been having any chest pain. Continue aspirin, statin, Plavix, and beta blocker therapy.  3. Hypertensive heart disease: Blood pressure was 132/94 today though he was hypertensive on clinic visit with primary care early this point. He remains on beta blocker, ACE inhibitor, and diuretic therapy. Amlodipine 5 mg daily was also added earlier today. He will continue to check his blood pressure at a local [facility] about 3 times per week.  4. Hyperlipidemia: He is on high potency statin therapy in the setting of MI last year. He is scheduled for complete metabolic panel and fasting lipids.  5. Tobacco abuse: Patient previously smoked cigars and then switch to cigarettes last summer. He is only smoked one cigarette since his discharge from [place] in April. Continued tobacco avoidance advised. He is motivated to quit.  6. Disposition: I will have him follow-up in approximately 6-8 weeks with Dr. Ellyn Hack.  Murray Hodgkins, NP 02/04/2016, 12:40 PM

## 2016-02-04 NOTE — Patient Instructions (Signed)
We will start a blood pressure medication called amlodipine to help control your blood pressure today. Take 5mg  once daily.  I want to check back in 1 mos to check on your blood pressure.  Your goal blood pressure is 140/90. Work on low salt/sodium diet - goal <1.5gm (1,500mg ) per day. Eat a diet high in fruits/vegetables and whole grains.  Look into mediterranean and DASH diet. Goal activity is 138min/wk of moderate intensity exercise.  This can be split into 30 minute chunks.  If you are not at this level, you can start with smaller 10-15 min increments and slowly build up activity. Look at [email or web address] for more resources  Please seek immediate medical attention at ER or Urgent Care if you develop: Chest pain, pressure or tightness. Shortness of breath accompanied by nausea or diaphoresis Visual changes Numbness or tingling on one side of the body Facial droop Altered mental status Or any concerning symptoms.

## 2016-02-04 NOTE — Assessment & Plan Note (Signed)
Check liver enzymes today 

## 2016-02-04 NOTE — Assessment & Plan Note (Signed)
Add amlodipine 5mg  for better BP control. Continue current regimen as started by Cardiology during previous hospitalization. Check CMET. Recheck 4 weeks. Commended pt on smoking cessation. Encourage DASH diet.

## 2016-02-04 NOTE — Assessment & Plan Note (Signed)
S/p Stent placement in 04/2015. Continue plavix and aspirin. Pt will follow with cardiology.

## 2016-02-04 NOTE — Assessment & Plan Note (Signed)
Continue atorvastatin. Recheck lipid panel.

## 2016-02-04 NOTE — Patient Instructions (Signed)
Medication Instructions:  Your physician recommends that you continue on your current medications as directed. Please refer to the Current Medication list given to you today.   Labwork: None ordered  Testing/Procedures: None ordered  Follow-Up: Your physician recommends that you schedule a follow-up appointment in: 4-6 weeks with Dr.Harding   Any Other Special Instructions Will Be Listed Below (If Applicable).     If you need a refill on your cardiac medications before your next appointment, please call your pharmacy.

## 2016-03-11 ENCOUNTER — Ambulatory Visit: Payer: PRIVATE HEALTH INSURANCE | Admitting: Cardiology

## 2016-03-11 ENCOUNTER — Ambulatory Visit: Payer: PRIVATE HEALTH INSURANCE | Admitting: Family Medicine

## 2016-03-25 ENCOUNTER — Ambulatory Visit: Payer: PRIVATE HEALTH INSURANCE | Admitting: Family Medicine

## 2016-04-14 ENCOUNTER — Encounter: Payer: Self-pay | Admitting: Cardiology

## 2016-04-15 ENCOUNTER — Encounter: Payer: Self-pay | Admitting: *Deleted

## 2016-04-15 ENCOUNTER — Ambulatory Visit (INDEPENDENT_AMBULATORY_CARE_PROVIDER_SITE_OTHER): Payer: PRIVATE HEALTH INSURANCE | Admitting: Cardiology

## 2016-04-21 ENCOUNTER — Encounter: Payer: Self-pay | Admitting: Cardiology

## 2016-04-21 NOTE — Progress Notes (Addendum)
PCP: Leata Mouse, NP  Clinic Note: Chief Complaint  Patient presents with  . Other    4-6 wk f/u no complaints. Meds reviewed verbally with pt.  . Cardiomyopathy    Ischemic  . Coronary Artery Disease    Status post anterior STEMI with LAD PCI  . Congestive Heart Failure    Chronic combined systolic and diastolic CHF    HPI: Jeff Cobb is a 45 y.o. male with a PMH below who presents today for 2 month follow-up for ischemic cardiomyopathy status post anterior STEMI 1 year ago.  He suffered an anterior STEMI in August 2016: Presented to [facility] at that time and had LAD PCI with DES stent. Was noted to have severe ischemic retinopathy that time an EF of 20-25 %. After short follow-up time. With [facility] cardiology, he was lost to follow-up.    Recent Hospitalizations:  He was admitted to [place] [facility] in early April with dyspnea and cough and was found to have acute pulmonary edema in the setting of marked it hypertension. He was aggressively diuresed and previous home medications were resumed and adjusted. With this, he had significant clinical improvement. Echo during hospitalization showed an EF of 30-35%. Troponins remain negative and it was not felt that he would require further ischemic evaluation.  Jeff Cobb was last seen on 02/04/2016 by Murray Hodgkins, NP for hospital follow-up. He was doing quite well at that time. He noted being compliant with medications. He done well with smoking cessation, only having 1 or 2 episodes of smoking 1 cigarette a day. He was monitoring his salt intake and adjusting his diet. He was weighing himself daily, averaging 195 pounds. Denied any exertional or resting dyspnea, PND or orthopnea. No anginal symptoms. His PCP had just added amlodipine for hypertension.--> No medication changes were made during his cardiology visit.  Studies Reviewed: PMH/PSH updated = historical data was obtained reviewing CARE Everywhere from [facility]  . CORONARY ANGIOPLASTY  WITH STENT PLACEMENT Left 04/2015   @ [facility]: mLAD 100% --> 3.0 x 15 Xience DES  . TRANSTHORACIC ECHOCARDIOGRAM  04/2015   @ [facility]: EF 25%, LVH, trivial MR, dilated thoracic aorta, anteroapical and apical akinesis and severely decreased contraction overall  . TRANSTHORACIC ECHOCARDIOGRAM  12/2015   Echo: EF 30-35%, mod conc LVH, mid-apicalanteroseptal, anterior, and apical AK, mildly dil LA    Interval History: Jeff Cobb presents today doing quite well. Has been very stable from a cardiology standpoint. 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 He has been out of his carvedilol for little under a week now, is noted that his heart may be skipping a few beats every now and then, but does not notice any rapid irregular heartbeats or palpitations. He has denied any resting or exertional anginal type chest pain or dyspnea. He is actually asked in if he can start going back to the gym. He's been walking routinely and denies any exertional dyspnea. No PND, orthopnea or edema. He is only been on spironolactone for diuresis, has not required any additional Lasix.  No  lightheadedness, dizziness, weakness or syncope/near syncope. No TIA/amaurosis fugax symptoms. No claudication.  He is very happy to announce that he is now on his 18th day without a cigarette. He seemed to be doing quite well and has not had the urge to relapse like he had before. He does note that his breathing is improved. Less coughing  ROS: A  comprehensive was performed. Review of Systems  Constitutional: Positive for chills and fever. Negative for malaise/fatigue and weight loss.  HENT: Negative for nosebleeds.   Eyes: Negative for blurred vision.  Respiratory: Negative for cough, shortness of breath and wheezing.   Cardiovascular: Negative for claudication.  Gastrointestinal: Negative for blood in stool, heartburn, melena and nausea.  Genitourinary:  Negative for hematuria.  Musculoskeletal: Negative for joint pain and myalgias.  Neurological: Negative for dizziness, weakness and headaches.  Endo/Heme/Allergies: Does not bruise/bleed easily.  Psychiatric/Behavioral: Negative for depression and memory loss. The patient is not nervous/anxious and does not have insomnia.     Past Medical History:  Diagnosis Date  . CAD S/P percutaneous coronary angioplasty 04/2015   a. 04/2015 Anterior STEMI/PCI @ [facility]: LM nl, mLAD 100% (3.0x15 Xience DES), LCx nl, OM1-3 nl, dRCA 50%, PDA nl.  . Chronic combined systolic and diastolic heart failure, NYHA class 2 ([place]) 04/2015   a. 12/2015 Echo: EF 30-35%.  . Hyperlipidemia   . Hypertensive heart disease   . Ischemic cardiomyopathy    a. 04/2015 Echo: EF 25%, LVH, trivial MR, dilated thoracic aorta, anteroapical and apical akinesis and severely decreased contraction overall; c. 12/2015 Echo: EF 30-35%, mod conc LVH, mid-apicalanteroseptal, anterior, and apical AK, mildly dil LA.  . Tobacco abuse     Past Surgical History:  Procedure Laterality Date  . CORONARY ANGIOPLASTY WITH STENT PLACEMENT Left 04/2015   @ [facility]: mLAD 100% --> 3.0 x 15 Xience DES  . TRANSTHORACIC ECHOCARDIOGRAM  04/2015   @ [facility]: EF 25%, LVH, trivial MR, dilated thoracic aorta, anteroapical and apical akinesis and severely decreased contraction overall  . TRANSTHORACIC ECHOCARDIOGRAM  12/2015   Echo: EF 30-35%, mod conc LVH, mid-apicalanteroseptal, anterior, and apical AK, mildly dil LA    Prior to Admission medications   Medication Sig Start Date End Date Taking? Authorizing Provider  amLODipine (NORVASC) 5 MG tablet Take 1 tablet (5 mg total) by mouth daily. 02/04/16  Yes Amy Overton Mam, NP  aspirin EC 81 MG tablet Take 81 mg by mouth daily.   Yes Historical Provider, MD  atorvastatin (LIPITOR) 80 MG tablet Take 1 tablet (80 mg total) by mouth daily. 02/04/16  Yes Amy Overton Mam, NP  clopidogrel (PLAVIX) 75 MG tablet Take 75 mg  by mouth daily.   Yes Historical Provider, MD  furosemide (LASIX) 40 MG tablet Take 1 tablet (40 mg total) by mouth daily. 02/04/16  Yes Amy Overton Mam, NP  lisinopril (PRINIVIL,ZESTRIL) 20 MG tablet Take 1 tablet (20 mg total) by mouth daily. 02/04/16  Yes Amy Overton Mam, NP  nitroGLYCERIN (NITROSTAT) 0.4 MG SL tablet Place 0.4 mg under the tongue every 5 (five) minutes as needed for chest pain.   Yes Historical Provider, MD  spironolactone (ALDACTONE) 25 MG tablet Take 1 tablet (25 mg total) by mouth daily. 02/04/16  Yes Amy Overton Mam, NP  acetaminophen (TYLENOL) 325 MG tablet Take 650 mg by mouth every 6 (six) hours as needed.    Historical Provider, MD  carvedilol (COREG) 25 MG tablet Take 25 mg by mouth 2 (two) times daily with a meal.    Historical Provider, MD   No Known Allergies   Social History   Social History  . Marital status: Married    Spouse name: N/A  . Number of children: N/A  . Years of education: N/A   Occupational History  . unemployed    Social History Main Topics  . Smoking status: Former Research scientist (life sciences)  .  Smokeless tobacco: Never Used  . Alcohol use 0.0 oz/week  . Drug use:     Types: Marijuana  . Sexual activity: Not Asked   Other Topics Concern  . None   Social History Narrative  . None    Family History  Problem Relation Age of Onset  . Heart disease Father   . Diabetes Mellitus II Father     Wt Readings from Last 3 Encounters:  04/22/16 198 lb 12 oz (90.2 kg)  02/04/16 200 lb 12.8 oz (91.1 kg)  02/04/16 200 lb 9.6 oz (91 kg)  - stable wgt @ home  PHYSICAL EXAM BP 133/89 (BP Location: Left Arm, Patient Position: Sitting, Cuff Size: Normal)   Pulse 94   Ht '5\' 7"'$  (1.702 m)   Wt 198 lb 12 oz (90.2 kg)   BMI 31.13 kg/m  General appearance: alert, cooperative, appears stated age, no distress and Borderline obese by BMI, but he is very muscular and therefore this is likely not accurate. HEENT: [place]/AT, EOMI, MMM, anicteric sclera  Neck: no  adenopathy, no carotid bruit and no JVD Lungs: clear to auscultation bilaterally, normal percussion bilaterally and non-labored Heart: regular rate and rhythm, S1, S2 normal, no murmur, click, rub or gallop; nondisplaced PMI. Occasional ectopy Abdomen: soft, non-tender; bowel sounds normal; no masses,  no organomegaly; no HJR Extremities: extremities normal, atraumatic, no cyanosis, or edema  Pulses: 2+ and symmetric;  Skin: mobility and turgor normal, no evidence of bleeding or bruising and no lesions noted  Neurologic: Mental status: Alert, oriented, thought content appropriate Cranial nerves: normal (II-XII grossly intact)    Adult ECG Report - not checked  Other studies Reviewed: Additional studies/ records that were reviewed today include:  Recent Labs:  Due for Lipids  No results found for: CHOL, HDL, LDLCALC, LDLDIRECT, TRIG, CHOLHDL   ASSESSMENT / PLAN: Problem List Items Addressed This Visit    ST elevation myocardial infarction (STEMI) ([place]) (Chronic)   Relevant Medications   nitroGLYCERIN (NITROSTAT) 0.4 MG SL tablet   carvedilol (COREG) 25 MG tablet   Ischemic cardiomyopathy - Primary (Chronic)    Last EF of 30-35%, but this was in the setting of acute exacerbation of heart failure and hypertension while not on medications. He is now on stable regimen of carvedilol, lisinopril, spironolactone as well as amlodipine and standing dose Lasix.  He is roughly 3 months out from the most recent echocardiogram.  To reassess his ejection fraction on appropriate medications, we will recheck an echocardiogram. If the EF is improved about 40%, which continue current regimen and he will follow-up in 6 months. If it continues to be less than 40%, would refer to Dr. Virl Axe from Electrophysiology to discuss potential ICD.      Relevant Medications   nitroGLYCERIN (NITROSTAT) 0.4 MG SL tablet   carvedilol (COREG) 25 MG tablet   Other Relevant Orders   ECHOCARDIOGRAM COMPLETE    Hypertensive heart disease (Chronic)    Blood pressure pretty well-controlled today even it with him being off his carvedilol. We will refill his carvedilol, and make all of his antihypertensive/CHF medications 90 day refill.      Relevant Medications   nitroGLYCERIN (NITROSTAT) 0.4 MG SL tablet   carvedilol (COREG) 25 MG tablet   Other Relevant Orders   Comp Met (CMET)   Hyperlipidemia with target LDL less than 70 (Chronic)    He was supposed to have his lipid panel checked. I have not seen in recent check. We'll go and  order fasting lipid panel and chemistry panel to be done at the time of his echocardiogram. Continue 80 mg of atorvastatin.      Relevant Medications   nitroGLYCERIN (NITROSTAT) 0.4 MG SL tablet   carvedilol (COREG) 25 MG tablet   Other Relevant Orders   Lipid Profile   Hepatic function panel   Compulsive tobacco user syndrome (Chronic)    He is now over one half a month out from smoking cessation. Smoking cessation instruction/counseling given:  commended patient for quitting and reviewed strategies for preventing relapses      Chronic combined systolic and diastolic heart failure, NYHA class 2 (HCC) (Chronic)    Relatively stable. Currently class I symptoms as best I can tell. But he did have a recent exacerbation so we would likely culprit to baseline. Is on statin stable dose of carvedilol and lisinopril as well as spironolactone. He is on once daily Lasix, and has not required additional doses. We rediscussed sliding scale dosing.  If his EF remains below ICD threshold, I think we would consider having him return sooner to discuss the possibility of switching from lisinopril to [facility].      Relevant Medications   nitroGLYCERIN (NITROSTAT) 0.4 MG SL tablet   carvedilol (COREG) 25 MG tablet   CAD S/P DES PCI of mLAD in setting of anterior STEMI (Chronic)    No active anginal symptoms. He did have a little chest pain was blood pressure is very high in the  hospital, but no ACS. Refill when necessary nitroglycerin. He remains on aspirin plus Plavix. Can probably consider stopping at least aspirin at next follow-up. Continue beta blocker and ACE inhibitor. Also on calcium channel blocker for antianginal effect.      Relevant Medications   nitroGLYCERIN (NITROSTAT) 0.4 MG SL tablet   carvedilol (COREG) 25 MG tablet    Other Visit Diagnoses    Essential hypertension       Relevant Medications   nitroGLYCERIN (NITROSTAT) 0.4 MG SL tablet   carvedilol (COREG) 25 MG tablet      Current medicines are reviewed at length with the patient today. (+/- concerns) ran out of Coreg last week. No refill on NTG SL The following changes have been made: refill Coreg & NTG. Use 90d Rx  Studies Ordered:  Orders Placed This Encounter  Procedures  . Lipid Profile  . Hepatic function panel  . Comp Met (CMET)  . ECHOCARDIOGRAM COMPLETE    Follow-up in 6 months with Dr. Saunders Revel, unless EF remains less than 40%. If less than 40%, will schedule EP consultation with Dr. Virl Axe in the next few months. We would also then consider converting from lisinopril to [facility].    Jeff Cobb, M.D., M.S.  [employer]  [address] [place], [place] [address] [phone] Fax [phone]

## 2016-04-22 ENCOUNTER — Encounter: Payer: Self-pay | Admitting: Cardiology

## 2016-04-22 ENCOUNTER — Ambulatory Visit (INDEPENDENT_AMBULATORY_CARE_PROVIDER_SITE_OTHER): Payer: PRIVATE HEALTH INSURANCE | Admitting: Cardiology

## 2016-04-22 ENCOUNTER — Encounter (INDEPENDENT_AMBULATORY_CARE_PROVIDER_SITE_OTHER): Payer: Self-pay

## 2016-04-22 VITALS — BP 133/89 | HR 94 | Ht 67.0 in | Wt 198.8 lb

## 2016-04-22 DIAGNOSIS — E785 Hyperlipidemia, unspecified: Secondary | ICD-10-CM

## 2016-04-22 DIAGNOSIS — I2102 ST elevation (STEMI) myocardial infarction involving left anterior descending coronary artery: Secondary | ICD-10-CM

## 2016-04-22 DIAGNOSIS — I10 Essential (primary) hypertension: Secondary | ICD-10-CM

## 2016-04-22 DIAGNOSIS — I2510 Atherosclerotic heart disease of native coronary artery without angina pectoris: Secondary | ICD-10-CM | POA: Diagnosis not present

## 2016-04-22 DIAGNOSIS — I255 Ischemic cardiomyopathy: Secondary | ICD-10-CM

## 2016-04-22 DIAGNOSIS — I110 Hypertensive heart disease with heart failure: Secondary | ICD-10-CM | POA: Diagnosis not present

## 2016-04-22 DIAGNOSIS — I5042 Chronic combined systolic (congestive) and diastolic (congestive) heart failure: Secondary | ICD-10-CM

## 2016-04-22 DIAGNOSIS — F17200 Nicotine dependence, unspecified, uncomplicated: Secondary | ICD-10-CM

## 2016-04-22 DIAGNOSIS — Z9861 Coronary angioplasty status: Secondary | ICD-10-CM

## 2016-04-22 MED ORDER — CARVEDILOL 25 MG PO TABS: 25.0000 mg | ORAL_TABLET | Freq: Two times a day (BID) | ORAL | 1 refills | Status: DC

## 2016-04-22 MED ORDER — NITROGLYCERIN 0.4 MG SL SUBL: 0.4000 mg | SUBLINGUAL_TABLET | SUBLINGUAL | 2 refills | Status: DC | PRN

## 2016-04-22 NOTE — Assessment & Plan Note (Signed)
Last EF of 30-35%, but this was in the setting of acute exacerbation of heart failure and hypertension while not on medications. He is now on stable regimen of carvedilol, lisinopril, spironolactone as well as amlodipine and standing dose Lasix.  He is roughly 3 months out from the most recent echocardiogram.  To reassess his ejection fraction on appropriate medications, we will recheck an echocardiogram. If the EF is improved about 40%, which continue current regimen and he will follow-up in 6 months. If it continues to be less than 40%, would refer to Dr. Virl Axe from Electrophysiology to discuss potential ICD.

## 2016-04-22 NOTE — Assessment & Plan Note (Signed)
Relatively stable. Currently class I symptoms as best I can tell. But he did have a recent exacerbation so we would likely culprit to baseline. Is on statin stable dose of carvedilol and lisinopril as well as spironolactone. He is on once daily Lasix, and has not required additional doses. We rediscussed sliding scale dosing.  If his EF remains below ICD threshold, I think we would consider having him return sooner to discuss the possibility of switching from lisinopril to [facility].

## 2016-04-22 NOTE — Assessment & Plan Note (Signed)
He was supposed to have his lipid panel checked. I have not seen in recent check. We'll go and order fasting lipid panel and chemistry panel to be done at the time of his echocardiogram. Continue 80 mg of atorvastatin.

## 2016-04-22 NOTE — Patient Instructions (Addendum)
Medication Instructions:  Your physician recommends that you continue on your current medications as directed. Please refer to the Current Medication list given to you today.   Labwork: Lipid, liver, CMET in one week. Nothing to eat or drink after midnight the evening before your labs.   Testing/Procedures: Your physician has requested that you have an echocardiogram. Echocardiography is a painless test that uses sound waves to create images of your heart. It provides your doctor with information about the size and shape of your heart and how well your heart's chambers and valves are working. This procedure takes approximately one hour. There are no restrictions for this procedure.    Follow-Up: Your physician wants you to follow-up in: 5-6 months with Dr. Saunders Revel.  You will receive a reminder letter in the mail two months in advance. If you don't receive a letter, please call our office to schedule the follow-up appointment.   Any Other Special Instructions Will Be Listed Below (If Applicable).     If you need a refill on your cardiac medications before your next appointment, please call your pharmacy.  Echocardiogram An echocardiogram, or echocardiography, uses sound waves (ultrasound) to produce an image of your heart. The echocardiogram is simple, painless, obtained within a short period of time, and offers valuable information to your health care provider. The images from an echocardiogram can provide information such as:  Evidence of coronary artery disease (CAD).  Heart size.  Heart muscle function.  Heart valve function.  Aneurysm detection.  Evidence of a past heart attack.  Fluid buildup around the heart.  Heart muscle thickening.  Assess heart valve function. LET [facility] CARE PROVIDER KNOW ABOUT:  Any allergies you have.  All medicines you are taking, including vitamins, herbs, eye drops, creams, and over-the-counter medicines.  Previous problems you or  members of your family have had with the use of anesthetics.  Any blood disorders you have.  Previous surgeries you have had.  Medical conditions you have.  Possibility of pregnancy, if this applies. BEFORE THE PROCEDURE  No special preparation is needed. Eat and drink normally.  PROCEDURE   In order to produce an image of your heart, gel will be applied to your chest and a wand-like tool (transducer) will be moved over your chest. The gel will help transmit the sound waves from the transducer. The sound waves will harmlessly bounce off your heart to allow the heart images to be captured in real-time motion. These images will then be recorded.  You may need an IV to receive a medicine that improves the quality of the pictures. AFTER THE PROCEDURE You may return to your normal schedule including diet, activities, and medicines, unless your health care provider tells you otherwise.   This information is not intended to replace advice given to you by your health care provider. Make sure you discuss any questions you have with your health care provider.   Document Released: 09/04/2000 Document Revised: 09/28/2014 Document Reviewed: 05/15/2013 Elsevier Interactive Patient Education [employer].

## 2016-04-22 NOTE — Assessment & Plan Note (Signed)
He is now over one half a month out from smoking cessation. Smoking cessation instruction/counseling given:  commended patient for quitting and reviewed strategies for preventing relapses

## 2016-04-22 NOTE — Assessment & Plan Note (Signed)
No active anginal symptoms. He did have a little chest pain was blood pressure is very high in the hospital, but no ACS. Refill when necessary nitroglycerin. He remains on aspirin plus Plavix. Can probably consider stopping at least aspirin at next follow-up. Continue beta blocker and ACE inhibitor. Also on calcium channel blocker for antianginal effect.

## 2016-04-22 NOTE — Assessment & Plan Note (Signed)
Blood pressure pretty well-controlled today even it with him being off his carvedilol. We will refill his carvedilol, and make all of his antihypertensive/CHF medications 90 day refill.

## 2016-04-30 ENCOUNTER — Other Ambulatory Visit: Payer: PRIVATE HEALTH INSURANCE

## 2016-05-07 ENCOUNTER — Telehealth: Payer: Self-pay | Admitting: Cardiology

## 2016-05-07 ENCOUNTER — Other Ambulatory Visit: Payer: PRIVATE HEALTH INSURANCE

## 2016-05-07 NOTE — Telephone Encounter (Signed)
Pt did not have labs drawn on Aug 10 as scheduled. Called pt to reschedule. No answer, no VM.

## 2016-05-07 NOTE — Telephone Encounter (Signed)
Pt called back and scheduled labs 8/22

## 2016-05-07 NOTE — Telephone Encounter (Signed)
Called patient to reschedule CMET, lipid and liver profile. No answer, no VM.

## 2016-05-12 ENCOUNTER — Other Ambulatory Visit (INDEPENDENT_AMBULATORY_CARE_PROVIDER_SITE_OTHER): Payer: PRIVATE HEALTH INSURANCE

## 2016-05-12 DIAGNOSIS — E785 Hyperlipidemia, unspecified: Secondary | ICD-10-CM

## 2016-05-12 DIAGNOSIS — I110 Hypertensive heart disease with heart failure: Secondary | ICD-10-CM

## 2016-05-13 LAB — COMPREHENSIVE METABOLIC PANEL
ALT: 23 IU/L (ref 0–44)
AST: 24 IU/L (ref 0–40)
Albumin/Globulin Ratio: 1.6 (ref 1.2–2.2)
Albumin: 4.6 g/dL (ref 3.5–5.5)
Alkaline Phosphatase: 49 IU/L (ref 39–117)
BUN/Creatinine Ratio: 13 (ref 9–20)
BUN: 24 mg/dL (ref 6–24)
Bilirubin Total: 0.8 mg/dL (ref 0.0–1.2)
CO2: 20 mmol/L (ref 18–29)
Calcium: 9.9 mg/dL (ref 8.7–10.2)
Chloride: 101 mmol/L (ref 96–106)
Creatinine, Ser: 1.83 mg/dL — ABNORMAL HIGH (ref 0.76–1.27)
GFR calc Af Amer: 51 mL/min/{1.73_m2} — ABNORMAL LOW (ref >59)
GFR calc non Af Amer: 44 mL/min/{1.73_m2} — ABNORMAL LOW (ref >59)
Globulin, Total: 2.9 g/dL (ref 1.5–4.5)
Glucose: 93 mg/dL (ref 65–99)
Potassium: 4.3 mmol/L (ref 3.5–5.2)
Sodium: 139 mmol/L (ref 134–144)
Total Protein: 7.5 g/dL (ref 6.0–8.5)

## 2016-05-13 LAB — LIPID PANEL
Chol/HDL Ratio: 2.6 ratio units (ref 0.0–5.0)
Cholesterol, Total: 215 mg/dL — ABNORMAL HIGH (ref 100–199)
HDL: 83 mg/dL (ref >39)
LDL Calculated: 89 mg/dL (ref 0–99)
Triglycerides: 217 mg/dL — ABNORMAL HIGH (ref 0–149)
VLDL Cholesterol Cal: 43 mg/dL — ABNORMAL HIGH (ref 5–40)

## 2016-05-13 LAB — HEPATIC FUNCTION PANEL: Bilirubin, Direct: 0.22 mg/dL (ref 0.00–0.40)

## 2016-05-18 ENCOUNTER — Other Ambulatory Visit: Payer: PRIVATE HEALTH INSURANCE

## 2016-05-18 ENCOUNTER — Encounter: Payer: Self-pay | Admitting: *Deleted

## 2016-05-26 ENCOUNTER — Other Ambulatory Visit: Payer: PRIVATE HEALTH INSURANCE

## 2016-05-27 ENCOUNTER — Telehealth: Payer: Self-pay | Admitting: Cardiology

## 2016-05-27 ENCOUNTER — Other Ambulatory Visit: Payer: Self-pay

## 2016-05-27 DIAGNOSIS — E785 Hyperlipidemia, unspecified: Secondary | ICD-10-CM

## 2016-05-27 MED ORDER — EZETIMIBE 10 MG PO TABS: 10.0000 mg | ORAL_TABLET | Freq: Every day | ORAL | 3 refills | Status: DC

## 2016-05-27 NOTE — Telephone Encounter (Signed)
Pt called back. Reviewed lab results and recommendations w/pt who verbalized understanding. Labs to be drawn in 4 months.   Echo rescheduled for 9/27 @ 3pm. Will call pt back to make aware.

## 2016-05-27 NOTE — Telephone Encounter (Signed)
"  His is on High dose Atorvastatin - would like to add Zetia & recheck in 4 months.   [facility] "  Prescription submitted, labs ordered.  Attempted to contact pt. No answer, no VM. Will call again.  Also need to reschedule echo

## 2016-05-28 NOTE — Telephone Encounter (Signed)
S/w pt who is agreeable to echo 9/27 @ 3pm. Asked pt to please call back if he finds he is unable to make the appointment. He verbalized understanding and states "I'm putting it in my phone right now".  Pt has cancelled/no show for echo appt three times.

## 2016-06-17 ENCOUNTER — Other Ambulatory Visit: Payer: PRIVATE HEALTH INSURANCE

## 2016-06-18 ENCOUNTER — Encounter: Payer: Self-pay | Admitting: *Deleted

## 2016-06-18 ENCOUNTER — Other Ambulatory Visit: Payer: PRIVATE HEALTH INSURANCE

## 2016-06-29 ENCOUNTER — Ambulatory Visit (INDEPENDENT_AMBULATORY_CARE_PROVIDER_SITE_OTHER): Payer: PRIVATE HEALTH INSURANCE

## 2016-06-29 ENCOUNTER — Other Ambulatory Visit: Payer: Self-pay

## 2016-06-29 DIAGNOSIS — I255 Ischemic cardiomyopathy: Secondary | ICD-10-CM

## 2016-07-28 ENCOUNTER — Institutional Professional Consult (permissible substitution): Payer: PRIVATE HEALTH INSURANCE | Admitting: Internal Medicine

## 2016-07-28 ENCOUNTER — Encounter: Payer: Self-pay | Admitting: *Deleted

## 2016-07-28 NOTE — Progress Notes (Deleted)
Follow-up Outpatient Visit Date: 07/28/2016  Chief Complaint: Follow-up coronary artery disease and ischemic cardiomyopathy  HPI:  Jeff Cobb is a 45 y.o. year-old male with history of coronary artery disease status post PCI to occluded mid LAD in 123XX123 complicated by ischemic cardiomyopathy with severe LV dysfunction (most recent LVEF 25-30% by echo), hypertension, hyperlipidemia, and prior tobacco use who presents for follow-up of CAD and cardiomyopathy.  --------------------------------------------------------------------------------------------------  Cardiovascular History & Procedures: Cardiovascular Problems:  ***  Risk Factors:  ***  Cath/PCI:  LHC/PCI (05/03/15, [facility]): LMCA normal. Large LAD giving rise to one large diagonal branch. Mid LAD is occluded. LCx is a medium-Coller branch with 3 obtuse marginals and no significant disease. RCA is a large branch with a 50% stenosis in the distal vessel at the PDA/PL bifurcation. Successful PCI to the mid LAD with placement of a science 3.0 x 15 mm drug-eluting stent.  CV Surgery:  None.  EP Procedures and Devices:  None.  Non-Invasive Evaluation(s):  TTE (06/29/16): Mildly dilated LV with severe anterior, anteroseptal, and apical hypokinesis. LVEF 123XX123 grade 1 diastolic dysfunction. Normal RV size and function. Unable to exclude apical thrombus.  Recent CV Pertinent Labs: Lab Results  Component Value Date   CHOL 215 (H) 05/12/2016   HDL 83 05/12/2016   LDLCALC 89 05/12/2016   TRIG 217 (H) 05/12/2016   CHOLHDL 2.6 05/12/2016   INR 1.07 12/24/2015   BNP 1,301.0 (H) 12/24/2015   K 4.3 05/12/2016   MG 1.8 12/25/2015   BUN 24 05/12/2016   CREATININE 1.83 (H) 05/12/2016     Past medical and surgical history were reviewed and updated in EPIC.   Outpatient Encounter Prescriptions as of 07/28/2016  Medication Sig  . acetaminophen (TYLENOL) 325 MG tablet Take 650 mg by mouth every 6 (six) hours as needed.  Marland Kitchen  amLODipine (NORVASC) 5 MG tablet Take 1 tablet (5 mg total) by mouth daily.  Marland Kitchen aspirin EC 81 MG tablet Take 81 mg by mouth daily.  Marland Kitchen atorvastatin (LIPITOR) 80 MG tablet Take 1 tablet (80 mg total) by mouth daily.  . carvedilol (COREG) 25 MG tablet Take 1 tablet (25 mg total) by mouth 2 (two) times daily with a meal.  . clopidogrel (PLAVIX) 75 MG tablet Take 75 mg by mouth daily.  Marland Kitchen ezetimibe (ZETIA) 10 MG tablet Take 1 tablet (10 mg total) by mouth daily.  . furosemide (LASIX) 40 MG tablet Take 1 tablet (40 mg total) by mouth daily.  Marland Kitchen lisinopril (PRINIVIL,ZESTRIL) 20 MG tablet Take 1 tablet (20 mg total) by mouth daily.  . nitroGLYCERIN (NITROSTAT) 0.4 MG SL tablet Place 1 tablet (0.4 mg total) under the tongue every 5 (five) minutes as needed for chest pain.  Marland Kitchen spironolactone (ALDACTONE) 25 MG tablet Take 1 tablet (25 mg total) by mouth daily.   No facility-administered encounter medications on file as of 07/28/2016.     Allergies: Patient has no known allergies.  Social History   Social History  . Marital status: Married    Spouse name: N/A  . Number of children: N/A  . Years of education: N/A   Occupational History  . unemployed    Social History Main Topics  . Smoking status: Former Research scientist (life sciences)  . Smokeless tobacco: Never Used  . Alcohol use 0.0 oz/week  . Drug use:     Types: Marijuana  . Sexual activity: Not on file   Other Topics Concern  . Not on file   Social History Narrative  .  No narrative on file    Family History  Problem Relation Age of Onset  . Heart disease Father   . Diabetes Mellitus II Father     Review of Systems: A 12-system review of systems was performed and was negative except as noted in the HPI.  --------------------------------------------------------------------------------------------------  Physical Exam: There were no vitals taken for this visit.  General:  *** HEENT: No conjunctival pallor or scleral icterus.  Moist mucous  membranes.  OP clear. Neck: Supple without lymphadenopathy, thyromegaly, JVD, or HJR.  No carotid bruit. Lungs: Normal work of breathing.  Clear to auscultation bilaterally without wheezes or crackles. Heart: Regular rate and rhythm without murmurs, rubs, or gallops.  Non-displaced PMI. Abd: Bowel sounds present.  Soft, NT/ND without hepatosplenomegaly Ext: No lower extremity edema.  Radial, PT, and DP pulses are 2+ bilaterally. Skin: warm and dry without rash  EKG:  ***  Lab Results  Component Value Date   WBC 7.8 12/26/2015   HGB 14.3 12/26/2015   HCT 40.2 12/26/2015   MCV 95.1 12/26/2015   PLT 247 12/26/2015    Lab Results  Component Value Date   NA 139 05/12/2016   K 4.3 05/12/2016   CL 101 05/12/2016   CO2 20 05/12/2016   BUN 24 05/12/2016   CREATININE 1.83 (H) 05/12/2016   GLUCOSE 93 05/12/2016   ALT 23 05/12/2016    Lab Results  Component Value Date   CHOL 215 (H) 05/12/2016   HDL 83 05/12/2016   LDLCALC 89 05/12/2016   TRIG 217 (H) 05/12/2016   CHOLHDL 2.6 05/12/2016    --------------------------------------------------------------------------------------------------  ASSESSMENT AND PLAN: Jeff Gave Eddi Hymes, MD 07/28/2016 8:16 AM

## 2016-12-17 ENCOUNTER — Ambulatory Visit (INDEPENDENT_AMBULATORY_CARE_PROVIDER_SITE_OTHER): Payer: PRIVATE HEALTH INSURANCE | Admitting: Internal Medicine

## 2016-12-17 ENCOUNTER — Encounter: Payer: Self-pay | Admitting: Internal Medicine

## 2016-12-17 VITALS — BP 118/72 | HR 86 | Ht 67.0 in | Wt 218.2 lb

## 2016-12-17 DIAGNOSIS — Z9861 Coronary angioplasty status: Secondary | ICD-10-CM

## 2016-12-17 DIAGNOSIS — E785 Hyperlipidemia, unspecified: Secondary | ICD-10-CM

## 2016-12-17 DIAGNOSIS — N289 Disorder of kidney and ureter, unspecified: Secondary | ICD-10-CM

## 2016-12-17 DIAGNOSIS — I5042 Chronic combined systolic (congestive) and diastolic (congestive) heart failure: Secondary | ICD-10-CM

## 2016-12-17 DIAGNOSIS — I255 Ischemic cardiomyopathy: Secondary | ICD-10-CM

## 2016-12-17 DIAGNOSIS — I10 Essential (primary) hypertension: Secondary | ICD-10-CM

## 2016-12-17 DIAGNOSIS — I2510 Atherosclerotic heart disease of native coronary artery without angina pectoris: Secondary | ICD-10-CM

## 2016-12-17 MED ORDER — SACUBITRIL-VALSARTAN 49-51 MG PO TABS: 1.0000 | ORAL_TABLET | Freq: Two times a day (BID) | ORAL | 6 refills | Status: DC

## 2016-12-17 MED ORDER — CARVEDILOL 25 MG PO TABS: 25.0000 mg | ORAL_TABLET | Freq: Two times a day (BID) | ORAL | 6 refills | Status: DC

## 2016-12-17 NOTE — Patient Instructions (Signed)
Medication Instructions:  Your physician has recommended you make the following change in your medication:  1. START Carvedilol 25 mg Twice a day 2. STOP Lisinopril  3. 2 days after stopping Lisinopril then START Entresto 49/51 Twice a day   Labwork: We will call you with your lab results from today.   Testing/Procedures: Your physician has requested that you have an echocardiogram. Echocardiography is a painless test that uses sound waves to create images of your heart. It provides your doctor with information about the size and shape of your heart and how well your heart's chambers and valves are working. This procedure takes approximately one hour. There are no restrictions for this procedure.    Follow-Up: Your physician recommends that you schedule a follow-up appointment in: 3 months with Dr. Saunders Revel.  It was a pleasure seeing you today here in the office. Please do not hesitate to give [place] a call back if you have any further questions. [place], BSN

## 2016-12-17 NOTE — Progress Notes (Signed)
Follow-up Outpatient Visit Date: 12/17/2016  Primary Care Provider: Leata Mouse, NP [address] [place] [address]  Chief Complaint: Follow-up coronary artery disease and chronic systolic heart failure  HPI:  Mr. Jeff Cobb is a 46 y.o. year-old male with history of coronary artery disease status post anterior STEMI and PCI [facility] in 04/2992, chronic systolic heart failure secondary to ischemic cardiomyopathy, hypertension, and hyperlipidemia, who presents for follow-up of CAD and CHF. The patient was last seen in our office in 04/2016, having previously been followed by Dr. Ellyn Hack. At the time, he was doing well. Today, he continues to feel well. 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 He has not been exercising regularly but instead is trying to "relax more." With this, he has gained about 20 pounds. However, he denies edema as well as shortness of breath, orthopnea, and PND. He has not had any chest pain, palpitations, or lightheadedness.  Mr. Harner is compliant with his medications most days, missing his Sunday morning medications from time to time. He remains on dual antiplatelet therapy without bleeding. Of note, he has only been taking carvedilol once daily. He continues to smoke cigarettes on rare occasions. He also uses marijuana on weekends.  --------------------------------------------------------------------------------------------------  Cardiovascular History & Procedures: Cardiovascular Problems:  Coronary artery disease status post anterior STEMI and PCI to the LAD (04/2015)  Ischemic cardiomyopathy  Risk Factors:  Known coronary artery disease, hypertension, hyperlipidemia, obesity, male gender, and family history  Cath/PCI:  LHC/PCI (05/03/15, [facility]): LMCA normal. Large LAD giving rise to a single diagonal branch. Mid LAD 100% occluded. LCx without evidence of significant disease. Dominant RCA with 50% stenosis at the distal bifurcation.  Successful PCI to mid LAD with placement of a Xience Alpine 3.0 x 15 mm drug-eluting stent, postdilated with 3.25 mm Aquasco balloon.  CV Surgery:  None  EP Procedures and Devices:  None  Non-Invasive Evaluation(s):  TTE (06/29/16): Mildly dilated LV with LVEF of 25-30% with severe anterior, anteroseptal, and apical hypokinesis. LVEF 25-30%. Grade 1 diastolic dysfunction. Normal RV size and function. No significant valvular abnormalities.  Recent CV Pertinent Labs: Lab Results  Component Value Date   CHOL 215 (H) 05/12/2016   HDL 83 05/12/2016   LDLCALC 89 05/12/2016   TRIG 217 (H) 05/12/2016   CHOLHDL 2.6 05/12/2016   INR 1.07 12/24/2015   BNP 1,301.0 (H) 12/24/2015   K 4.3 05/12/2016   MG 1.8 12/25/2015   BUN 24 05/12/2016   CREATININE 1.83 (H) 05/12/2016    Past medical and surgical history were reviewed and updated in EPIC.  Outpatient Encounter Prescriptions as of 12/17/2016  Medication Sig  . amLODipine (NORVASC) 5 MG tablet Take 1 tablet (5 mg total) by mouth daily.  Marland Kitchen aspirin EC 81 MG tablet Take 81 mg by mouth daily.  Marland Kitchen atorvastatin (LIPITOR) 80 MG tablet Take 1 tablet (80 mg total) by mouth daily.  . carvedilol (COREG) 25 MG tablet Take 1 tablet (25 mg total) by mouth 2 (two) times daily with a meal. (Patient taking differently: Take 25 mg by mouth daily. )  . clopidogrel (PLAVIX) 75 MG tablet Take 75 mg by mouth daily.  . furosemide (LASIX) 40 MG tablet Take 1 tablet (40 mg total) by mouth daily.  Marland Kitchen lisinopril (PRINIVIL,ZESTRIL) 20 MG tablet Take 1 tablet (20 mg total) by mouth daily.  . nitroGLYCERIN (NITROSTAT) 0.4 MG SL tablet Place 1 tablet (0.4 mg total) under the  tongue every 5 (five) minutes as needed for chest pain.  Marland Kitchen spironolactone (ALDACTONE) 25 MG tablet Take 1 tablet (25 mg total) by mouth daily.  Marland Kitchen acetaminophen (TYLENOL) 325 MG tablet Take 650 mg by mouth every 6 (six) hours as needed.  . ezetimibe (ZETIA) 10 MG tablet Take 1 tablet (10 mg total) by  mouth daily.   No facility-administered encounter medications on file as of 12/17/2016.     Allergies: Patient has no known allergies.  Social History   Social History  . Marital status: Married    Spouse name: N/A  . Number of children: N/A  . Years of education: N/A   Occupational History  . unemployed    Social History Main Topics  . Smoking status: Current Some Day Smoker    Packs/day: 0.10  . Smokeless tobacco: Never Used  . Alcohol use 1.2 oz/week    2 Shots of liquor per week  . Drug use: Yes    Types: Marijuana  . Sexual activity: Not on file   Other Topics Concern  . Not on file   Social History Narrative  . No narrative on file    Family History  Problem Relation Age of Onset  . Heart disease Father   . Diabetes Mellitus II Father     Review of Systems: A 12-system review of systems was performed and was negative except as noted in the HPI.  --------------------------------------------------------------------------------------------------  Physical Exam: BP 118/72 (BP Location: Left Arm, Patient Position: Sitting, Cuff Size: Normal)   Pulse 86   Ht 5\' 7"  (1.702 m)   Wt 218 lb 4 oz (99 kg)   BMI 34.18 kg/m   General:  Obese man, seated comfortably in the exam room. HEENT: No conjunctival pallor or scleral icterus.  Moist mucous membranes.  OP clear. Neck: Supple without lymphadenopathy, thyromegaly, JVD, or HJR.  No carotid bruit. Lungs: Normal work of breathing.  Clear to auscultation bilaterally without wheezes or crackles. Heart: Regular rate and rhythm without murmurs, rubs, or gallops.  Non-displaced PMI. Abd: Bowel sounds present.  Soft, NT/ND without hepatosplenomegaly Ext: No lower extremity edema.  Radial, PT, and DP pulses are 2+ bilaterally. Skin: warm and dry without rash  EKG:  Normal sinus rhythm with anteroseptal Q waves and lateral T-wave inversions. No significant change from prior tracing on 02/04/16 (I have personally reviewed  both tracings).  Lab Results  Component Value Date   WBC 7.8 12/26/2015   HGB 14.3 12/26/2015   HCT 40.2 12/26/2015   MCV 95.1 12/26/2015   PLT 247 12/26/2015    Lab Results  Component Value Date   NA 139 05/12/2016   K 4.3 05/12/2016   CL 101 05/12/2016   CO2 20 05/12/2016   BUN 24 05/12/2016   CREATININE 1.83 (H) 05/12/2016   GLUCOSE 93 05/12/2016   ALT 23 05/12/2016    Lab Results  Component Value Date   CHOL 215 (H) 05/12/2016   HDL 83 05/12/2016   LDLCALC 89 05/12/2016   TRIG 217 (H) 05/12/2016   CHOLHDL 2.6 05/12/2016    --------------------------------------------------------------------------------------------------  ASSESSMENT AND PLAN: Coronary artery disease without angina status post anterior STEMI and PCI No symptoms of recurrent myocardial ischemia. Patient is tolerating current medication regimen well, though he has been taking carvedilol only once daily. We will have him take carvedilol twice a day. As he is tolerating dual antiplatelet therapy well, we will plan to continue this indefinitely. We will continue with secondary prevention including aggressive  lipid therapy.  Chronic systolic and diastolic heart failure secondary to ischemic cardiomyopathy Patient appears euvolemic and well compensated with NYHA class II symptoms. Unfortunately, his LVEF had not recovered significantly by echo in 06/2016. However, he has not been taking all his medications as prescribed. We have agreed to make carvedilol twice a day and to switch lisinopril to Entresto 49/51 tablets twice a day. We will check a basic metabolic panel today, given that the patient had significant azotemia on prior labs last year. Based on these results, he will likely need to come back in 2 weeks for repeat BMP, given switch to [facility]. We will continue with spironolactone. We have agreed to repeat a limited echo to reassess LVEF in about 3 months. If it remains severely reduced at that time, we have  agreed to referral to electrophysiology for consideration of ICD placement.  Hypertension Blood pressure is well controlled today. As above, we will switch lisinopril to [facility]. If hypotension becomes an issue, we could consider discontinuation of amlodipine. We will also ensure the patient is taking carvedilol twice a day, as prescribed.  Hyperlipidemia Lipid panel in 04/2016 was noted for an LDL of 89, which is above our goal of less than 70. Patient is currently on high intensity statin therapy and ezetimibe. We will repeat this today. I have also encouraged lifestyle modification, including exercise with goal of weight loss. If his LDL remains above goal, we would need to consider a trial of PCSK9 inhibitor in the future.  Renal insufficiency Creatinine noted to be elevated at 1.83 when labs were last checked in 04/2016. We will recheck a BMP today.  Follow-up: Return to clinic in 3 months.  Nelva Bush, MD 12/17/2016 8:28 AM

## 2016-12-17 NOTE — Progress Notes (Signed)
Medication Samples have been provided to the patient.  Drug name: Delene Loll       Strength: 49/51        Qty: 2 boxes  LOT: [id number]  Exp.Date: Oct 2018  Dosing instructions: Take one tablet twice a day

## 2016-12-18 ENCOUNTER — Telehealth: Payer: Self-pay | Admitting: *Deleted

## 2016-12-18 ENCOUNTER — Telehealth: Payer: Self-pay | Admitting: Internal Medicine

## 2016-12-18 DIAGNOSIS — I255 Ischemic cardiomyopathy: Secondary | ICD-10-CM

## 2016-12-18 DIAGNOSIS — I5042 Chronic combined systolic (congestive) and diastolic (congestive) heart failure: Secondary | ICD-10-CM

## 2016-12-18 LAB — COMPREHENSIVE METABOLIC PANEL
ALT: 26 IU/L (ref 0–44)
AST: 31 IU/L (ref 0–40)
Albumin/Globulin Ratio: 1.6 (ref 1.2–2.2)
Albumin: 4.4 g/dL (ref 3.5–5.5)
Alkaline Phosphatase: 53 IU/L (ref 39–117)
BUN/Creatinine Ratio: 14 (ref 9–20)
BUN: 32 mg/dL — ABNORMAL HIGH (ref 6–24)
Bilirubin Total: 0.5 mg/dL (ref 0.0–1.2)
CO2: 18 mmol/L (ref 18–29)
Calcium: 9.2 mg/dL (ref 8.7–10.2)
Chloride: 97 mmol/L (ref 96–106)
Creatinine, Ser: 2.21 mg/dL — ABNORMAL HIGH (ref 0.76–1.27)
GFR calc Af Amer: 41 mL/min/{1.73_m2} — ABNORMAL LOW (ref >59)
GFR calc non Af Amer: 35 mL/min/{1.73_m2} — ABNORMAL LOW (ref >59)
Globulin, Total: 2.7 g/dL (ref 1.5–4.5)
Glucose: 107 mg/dL — ABNORMAL HIGH (ref 65–99)
Potassium: 3.8 mmol/L (ref 3.5–5.2)
Sodium: 134 mmol/L (ref 134–144)
Total Protein: 7.1 g/dL (ref 6.0–8.5)

## 2016-12-18 LAB — SPECIMEN STATUS REPORT

## 2016-12-18 MED ORDER — LISINOPRIL 10 MG PO TABS: 10.0000 mg | ORAL_TABLET | Freq: Every day | ORAL | 3 refills | Status: DC

## 2016-12-18 NOTE — Telephone Encounter (Signed)
Pharmacist is calling regarding drug interaction Entresto and Lisinopril. Please call.

## 2016-12-18 NOTE — Telephone Encounter (Signed)
-----   Message from Nelva Bush, MD sent at 12/18/2016 10:01 AM EDT ----- Please let Jeff Cobb know that his kidney function is abnormal and has worsened since last check in 04/2016. Please have him stop taking Entresto and spironolactone. He should also not restart the lisinopril that we discontinued yesterday. He should return in about a week for repeat BMP. If is creatinine remains significantly elevated at that time, we will need to consider referral to a nephrologist. Thanks.

## 2016-12-18 NOTE — Telephone Encounter (Signed)
No answer.  No voicemail setup

## 2016-12-18 NOTE — Telephone Encounter (Signed)
Spoke with Lattie Haw at the pharmacy and let her know that patient was to discontinue lisinopril, entresto, and spironolactone. She was appreciative for the call and let her know that I would call the patient as well.

## 2016-12-18 NOTE — Telephone Encounter (Signed)
Spoke with patient to confirm with him not to take entresto, spironolactone, or lisinopril and have repeat labs done at the end of next week over at the [facility]. He verbalized understanding of all instructions and has no further questions at this time.

## 2016-12-18 NOTE — Addendum Note (Signed)
Addended by: Valora Corporal on: 12/18/2016 05:01 PM   Modules accepted: Orders

## 2016-12-18 NOTE — Telephone Encounter (Addendum)
Spoke with Lattie Haw pharmacist at [facility]. Let her know that patients entresto has been discontinued and to discontinue the  lisinopril. She verbalized understanding and had no further questions at this time.

## 2016-12-18 NOTE — Telephone Encounter (Addendum)
Spoke with patient and reviewed results and recommendations by Dr. Saunders Revel. Instructed him to stop taking entresto and spironolactone and also to stop the lisinopril and then have repeat labs at the end of next week. Instructed him to go to [facility] to have those labs done and he verbalized understanding of our conversation, agreement with plan, and had no further questions at this time. Entered orders for labs and medication changes.

## 2016-12-20 LAB — LIPID PANEL W/O CHOL/HDL RATIO
Cholesterol, Total: 186 mg/dL (ref 100–199)
HDL: 58 mg/dL (ref >39)
Triglycerides: 522 mg/dL — ABNORMAL HIGH (ref 0–149)

## 2016-12-20 LAB — AMBIG ABBREV LP DEFAULT

## 2016-12-20 LAB — SPECIMEN STATUS REPORT

## 2016-12-22 ENCOUNTER — Other Ambulatory Visit: Payer: Self-pay | Admitting: *Deleted

## 2016-12-22 DIAGNOSIS — E785 Hyperlipidemia, unspecified: Secondary | ICD-10-CM

## 2016-12-22 DIAGNOSIS — I110 Hypertensive heart disease with heart failure: Secondary | ICD-10-CM

## 2016-12-22 DIAGNOSIS — Z79899 Other long term (current) drug therapy: Secondary | ICD-10-CM

## 2016-12-23 ENCOUNTER — Other Ambulatory Visit
Admission: RE | Admit: 2016-12-23 | Discharge: 2016-12-23 | Disposition: A | Discharge status: Home / Self Care | Payer: PRIVATE HEALTH INSURANCE | Source: Ambulatory Visit | Attending: Internal Medicine | Admitting: Internal Medicine

## 2016-12-23 DIAGNOSIS — I110 Hypertensive heart disease with heart failure: Secondary | ICD-10-CM | POA: Diagnosis present

## 2016-12-23 DIAGNOSIS — I5042 Chronic combined systolic (congestive) and diastolic (congestive) heart failure: Secondary | ICD-10-CM | POA: Insufficient documentation

## 2016-12-23 DIAGNOSIS — Z79899 Other long term (current) drug therapy: Secondary | ICD-10-CM | POA: Insufficient documentation

## 2016-12-23 DIAGNOSIS — E785 Hyperlipidemia, unspecified: Secondary | ICD-10-CM

## 2016-12-23 LAB — BASIC METABOLIC PANEL
Anion gap: 11 (ref 5–15)
BUN: 22 mg/dL — ABNORMAL HIGH (ref 6–20)
CO2: 25 mmol/L (ref 22–32)
Calcium: 9.5 mg/dL (ref 8.9–10.3)
Chloride: 98 mmol/L — ABNORMAL LOW (ref 101–111)
Creatinine, Ser: 1.76 mg/dL — ABNORMAL HIGH (ref 0.61–1.24)
GFR calc Af Amer: 53 mL/min — ABNORMAL LOW (ref >60)
GFR calc non Af Amer: 46 mL/min — ABNORMAL LOW (ref >60)
Glucose, Bld: 122 mg/dL — ABNORMAL HIGH (ref 65–99)
Potassium: 3.2 mmol/L — ABNORMAL LOW (ref 3.5–5.1)
Sodium: 134 mmol/L — ABNORMAL LOW (ref 135–145)

## 2016-12-23 LAB — LIPID PANEL
Cholesterol: 179 mg/dL (ref 0–200)
HDL: 56 mg/dL (ref >40)
LDL Cholesterol: UNDETERMINED mg/dL (ref 0–99)
Total CHOL/HDL Ratio: 3.2 RATIO
Triglycerides: 487 mg/dL — ABNORMAL HIGH (ref <150)
VLDL: UNDETERMINED mg/dL (ref 0–40)

## 2016-12-24 ENCOUNTER — Other Ambulatory Visit: Payer: Self-pay

## 2016-12-24 DIAGNOSIS — I5042 Chronic combined systolic (congestive) and diastolic (congestive) heart failure: Secondary | ICD-10-CM

## 2016-12-24 LAB — LDL CHOLESTEROL, DIRECT: Direct LDL: 70 mg/dL (ref 0–99)

## 2016-12-24 MED ORDER — FISH OIL 1000 MG PO CPDR: 1000.0000 mg | DELAYED_RELEASE_CAPSULE | Freq: Two times a day (BID) | ORAL | 0 refills | Status: DC

## 2016-12-24 MED ORDER — SACUBITRIL-VALSARTAN 49-51 MG PO TABS: 1.0000 | ORAL_TABLET | Freq: Two times a day (BID) | ORAL | 3 refills | Status: DC

## 2016-12-24 MED ORDER — POTASSIUM CHLORIDE CRYS ER 20 MEQ PO TBCR: 20.0000 meq | EXTENDED_RELEASE_TABLET | Freq: Every day | ORAL | 3 refills | Status: DC

## 2017-01-14 ENCOUNTER — Telehealth: Payer: Self-pay | Admitting: *Deleted

## 2017-01-14 NOTE — Telephone Encounter (Signed)
Patient was due to return for repeat BMP around 12/1216. He did not have labs drawn as of today. No answer on patient's number and no voicemail set up. Will try again later to remind patient to get labs drawn.

## 2017-01-18 NOTE — Telephone Encounter (Signed)
Spoke with patient. He said he cannot come until next Monday 01/25/17 for lab work.  Stated there is no way in his schedule to come before then. Stressed the importance of rechecking labs to make sure his kidney's are functioning properly and that his potassium is ok. He verbalized understanding. He also said his pharmacy did not have his Entresto prescription yet.  Called his pharmacy and they do have it on file. They will fill it and have it ready for the patient.  Patient notified that Rx ready and will go pick it up soon.

## 2017-01-27 ENCOUNTER — Telehealth: Payer: Self-pay | Admitting: *Deleted

## 2017-01-27 DIAGNOSIS — I255 Ischemic cardiomyopathy: Secondary | ICD-10-CM

## 2017-01-27 DIAGNOSIS — I10 Essential (primary) hypertension: Secondary | ICD-10-CM

## 2017-01-27 NOTE — Telephone Encounter (Signed)
Patient was supposed to have repeat lab work and had not been to have done as of today.  Patient had said he would go on 01/25/17 in previous telephone note. Called patient to remind him. He said he was not able to get the labs this past Monday but would go this Monday. Advised that we need to check his labs as soon as possible to check his potassium and kidney function and the importance of doing so. He verbalized understanding and said he has to keep his 48-month granddaughter in the mornings. Let him know he could go at any time during the day to the [facility] for the lab work. He verbalized understanding and will go as soon as possible.  We also reviewed his current medication list. Patient is not taking all the medications on his current list. Had patient read all the medications to me directly from his medicine bottles. He has not been taking carvedilol, amlodipine, or  Clopidogrel. He cannot recall when he stopped these but thinks it was his last office visit on 12/17/16. He stated "these weren't in my bag when I picked up at my pharmacy." Explained that we did make medication changes but he was to continue on his other listed medications that were not changed. He apparently needed refills on some medications and since they weren't in the bag he thought he was not supposed to be taking them anymore.  Patient went and got his current list he had at home. We reviewed the list and he wrote down and took away the medications he was not to be and should be taking. Patient has been taking Entresto but was advised today to not take.  He did stop taking Lisinopril and Spironolactone on 12/18/16. Patient verbalized understanding and importance of taking medications and getting the repeat labs as soon as possible.  Patient needs refills for atorvastatin, ezetimibe, furosemide, amlodipine, and clopidogrel. Patient never picked up Rx for carvedilol. Will route to Dr End to verify is it ok to refills  medications by him as some were medications prescribed by internal medicine. Routing to Dr End.

## 2017-01-27 NOTE — Telephone Encounter (Signed)
It is okay to refill medications but he will need to come in for routine labs as previously discussed. Thanks.

## 2017-01-28 MED ORDER — FUROSEMIDE 40 MG PO TABS: 40.0000 mg | ORAL_TABLET | Freq: Every day | ORAL | 2 refills | Status: DC

## 2017-01-28 MED ORDER — CARVEDILOL 25 MG PO TABS: 25.0000 mg | ORAL_TABLET | Freq: Two times a day (BID) | ORAL | 2 refills | Status: DC

## 2017-01-28 MED ORDER — POTASSIUM CHLORIDE CRYS ER 20 MEQ PO TBCR: 20.0000 meq | EXTENDED_RELEASE_TABLET | Freq: Every day | ORAL | 2 refills | Status: DC

## 2017-01-28 MED ORDER — AMLODIPINE BESYLATE 5 MG PO TABS: 5.0000 mg | ORAL_TABLET | Freq: Every day | ORAL | 2 refills | Status: DC

## 2017-01-28 MED ORDER — EZETIMIBE 10 MG PO TABS: 10.0000 mg | ORAL_TABLET | Freq: Every day | ORAL | 2 refills | Status: DC

## 2017-01-28 MED ORDER — CLOPIDOGREL BISULFATE 75 MG PO TABS: 75.0000 mg | ORAL_TABLET | Freq: Every day | ORAL | 2 refills | Status: DC

## 2017-01-28 MED ORDER — ATORVASTATIN CALCIUM 80 MG PO TABS: 80.0000 mg | ORAL_TABLET | Freq: Every day | ORAL | 2 refills | Status: DC

## 2017-01-28 NOTE — Telephone Encounter (Signed)
Received incoming from patient. Patient said he went to pharmacy yesterday and picked Entresto.  Advised patient that has been discontinued and he verbalized understanding not to take it. Let him know I sent in the refills this morning. He will go pick them up and start taking them today. Patient is planning to come in soon for the lab work. Encouraged patient to come in today or tomorrow if possible and stressed the importance of it, although patient said he will come on Monday, 02/01/17.

## 2017-01-28 NOTE — Telephone Encounter (Signed)
Refills sent to patient's preferred pharmacy.  Attempted to reach patient to let him know I sent in the prescriptions. No answer and no voicemail set up.

## 2017-02-02 NOTE — Telephone Encounter (Signed)
Patient did not get lab work yesterday as advised. Attempted to reach patient. No answer and no voicemail set up.

## 2017-02-03 NOTE — Telephone Encounter (Signed)
Thank you for the update!

## 2017-02-03 NOTE — Telephone Encounter (Signed)
Contacted patient. He said he has not been able to get labs because after he gets off work he has to go and get his granddaughter. He hopes tomorrow he will be able to do so.

## 2017-02-12 ENCOUNTER — Other Ambulatory Visit: Payer: Self-pay

## 2017-02-12 NOTE — Telephone Encounter (Signed)
I have never seen patient in office, he was seen previously by Fredia Sorrow NP, but is closely followed by Cardiology Dr Harrell Gave End ([facility]) who is adjusting BP medications.  Will plan on forwarding this refill request to Dr End.  Nobie Putnam, [facility] 02/12/2017, 10:38 AM

## 2017-02-12 NOTE — Telephone Encounter (Signed)
Last ov  02/04/16 Last filled 12/26/15 Please review. Thank you. sd

## 2017-02-12 NOTE — Telephone Encounter (Signed)
Dr. Jerene Pitch, thank you for the update. Mr. Jeff Cobb has failed to present for important laboratory monitoring on several occasions. I feel that continued prescribing of spironolactone is unsafe due to inability to appropriately monitor renal function and potassium. I will decline all refills until Mr. Jeff Cobb follows up with [place]. I will have our office attempt to reach out to him again. Please let me know if any other questions or concerns arise.  Nelva Bush, MD [facility] Pager: [phone]

## 2017-02-17 ENCOUNTER — Telehealth: Payer: Self-pay | Admitting: *Deleted

## 2017-02-17 NOTE — Telephone Encounter (Signed)
Attempted to reach patient. No answer on listed number and no VM set up.

## 2017-02-17 NOTE — Telephone Encounter (Signed)
Attempted to contact patient. No answer on listed number and no VM set up. See telephone note under patient calls.

## 2017-02-17 NOTE — Telephone Encounter (Signed)
End, Harrell Gave, MD  You; [place] Devonne Doughty, DO 5 days ago      Dr. Jerene Pitch, thank you for the update. Mr. Serio has failed to present for important laboratory monitoring on several occasions. I feel that continued prescribing of spironolactone is unsafe due to inability to appropriately monitor renal function and potassium. I will decline all refills until Mr. Schlarb follows up with [place]. I will have our office attempt to reach out to him again. Please let me know if any other questions or concerns arise.  Nelva Bush, MD [facility] Pager: [phone]

## 2017-02-23 ENCOUNTER — Other Ambulatory Visit: Payer: Self-pay

## 2017-02-23 NOTE — Telephone Encounter (Signed)
Spoke with patient. Stressed the importance of following up with lab work in order to receive further refills on medications as it is important to monitor his potassium and renal function. Patient verbalized understanding, stating that he takes care of his young grand daughter and has been unable to go. His wife gets out of school this week and then she will be able to watch the baby so he can have labs. He said he will try to go next week. Patient has upcoming echo and appt with Dr End at the end of this month.

## 2017-03-15 ENCOUNTER — Other Ambulatory Visit: Payer: PRIVATE HEALTH INSURANCE

## 2017-03-16 ENCOUNTER — Other Ambulatory Visit
Admission: RE | Admit: 2017-03-16 | Discharge: 2017-03-16 | Disposition: A | Discharge status: Home / Self Care | Payer: PRIVATE HEALTH INSURANCE | Source: Ambulatory Visit | Attending: Internal Medicine | Admitting: Internal Medicine

## 2017-03-16 DIAGNOSIS — E785 Hyperlipidemia, unspecified: Secondary | ICD-10-CM | POA: Diagnosis present

## 2017-03-16 LAB — HEPATIC FUNCTION PANEL
ALT: 46 U/L (ref 17–63)
AST: 48 U/L — ABNORMAL HIGH (ref 15–41)
Albumin: 4.2 g/dL (ref 3.5–5.0)
Alkaline Phosphatase: 54 U/L (ref 38–126)
Bilirubin, Direct: 0.2 mg/dL (ref 0.1–0.5)
Indirect Bilirubin: 1.1 mg/dL — ABNORMAL HIGH (ref 0.3–0.9)
Total Bilirubin: 1.3 mg/dL — ABNORMAL HIGH (ref 0.3–1.2)
Total Protein: 7.6 g/dL (ref 6.5–8.1)

## 2017-03-16 LAB — LIPID PANEL
Cholesterol: 255 mg/dL — ABNORMAL HIGH (ref 0–200)
LDL Cholesterol: UNDETERMINED mg/dL (ref 0–99)
Triglycerides: 1619 mg/dL — ABNORMAL HIGH (ref <150)
VLDL: UNDETERMINED mg/dL (ref 0–40)

## 2017-03-17 ENCOUNTER — Telehealth: Payer: Self-pay | Admitting: Internal Medicine

## 2017-03-17 ENCOUNTER — Ambulatory Visit: Payer: PRIVATE HEALTH INSURANCE | Admitting: Internal Medicine

## 2017-03-17 DIAGNOSIS — E782 Mixed hyperlipidemia: Secondary | ICD-10-CM

## 2017-03-17 DIAGNOSIS — E785 Hyperlipidemia, unspecified: Secondary | ICD-10-CM

## 2017-03-17 NOTE — Telephone Encounter (Signed)
Lipid panel notable for severely elevated triglycerides. I recommend that he start fenofibrate 145 mg daily. He should alert [place] if he develops any muscle pain or weakness. We will need to repeat a lipid panel in about 6 weeks with a direct LDL. Thanks.

## 2017-03-17 NOTE — Telephone Encounter (Signed)
Patient cancelled appt with Dr End today due to having a conflicting appt elsewhere. Patient had Lipid/Liver profile labs yesterday that for went to Dr Ellyn Hack. Will route to Dr End to review if possible to give results to patient.

## 2017-03-17 NOTE — Telephone Encounter (Signed)
Pt would like lab results. Please call. 

## 2017-03-18 NOTE — Telephone Encounter (Signed)
No answer. No voicemail set up. Unable to leave message. Orders for LIPID and direst LDL entered.

## 2017-03-18 NOTE — Telephone Encounter (Signed)
No answer and no voicemail set up  

## 2017-03-19 ENCOUNTER — Other Ambulatory Visit: Payer: PRIVATE HEALTH INSURANCE

## 2017-03-25 MED ORDER — FENOFIBRATE 145 MG PO TABS: 145.0000 mg | ORAL_TABLET | Freq: Every day | ORAL | 6 refills | Status: DC

## 2017-03-25 NOTE — Telephone Encounter (Signed)
Spoke with patient at length and reviewed results and recommendations. He verbalized understanding, agreement with plan, and had no further questions at this time. He did request that I mail him lab slips to remind him to have them done in 6 weeks. Instructed him to give [place] a call if he has any symptoms.

## 2017-04-07 ENCOUNTER — Other Ambulatory Visit: Payer: Self-pay | Admitting: *Deleted

## 2017-04-07 ENCOUNTER — Other Ambulatory Visit: Payer: Self-pay | Admitting: Internal Medicine

## 2017-04-07 DIAGNOSIS — R9430 Abnormal result of cardiovascular function study, unspecified: Secondary | ICD-10-CM

## 2017-04-07 DIAGNOSIS — I255 Ischemic cardiomyopathy: Secondary | ICD-10-CM

## 2017-04-14 ENCOUNTER — Ambulatory Visit: Payer: PRIVATE HEALTH INSURANCE | Admitting: Internal Medicine

## 2017-04-15 ENCOUNTER — Ambulatory Visit: Payer: PRIVATE HEALTH INSURANCE

## 2017-04-15 NOTE — Progress Notes (Deleted)
Patient ID: Jeff Cobb                 DOB: 08-27-1971                    MRN: [id number]     HPI: Jeff Cobb is a 46 y.o. male patient of Dr. Saunders Revel that presents today for lipid evaluation.  PMH includes coronary artery disease status post anterior STEMI and PCI [facility] in 01/538, chronic systolic heart failure secondary to ischemic cardiomyopathy, hypertension, and hyperlipidemia. He was supposed to come to clinic to have a fasting lipid panel and direct LDL drawn, but this has not been done.   Patient present today for cholesterol management.   Risk Factors: STEMI LDL Goal: <70, TG <150  Current Medications: Fish oil 1000mg  BID, fenofibrate 145mg  daily, ezetimibe 10mg  daily, atorvastatin 80mg  daily Intolerances: none  Diet:   Exercise:   Family History:   Social History: he continues to smoke cigarettes on rare occasions. He endorses marijuana use on weekends.   Labs: 03/16/17: TC 255, TG 1619 -   Past Medical History:  Diagnosis Date  . CAD S/P percutaneous coronary angioplasty 04/2015   a. 04/2015 Anterior STEMI/PCI @ [facility]: LM nl, mLAD 100% (3.0x15 Xience DES), LCx nl, OM1-3 nl, dRCA 50%, PDA nl.  . Chronic combined systolic and diastolic heart failure, NYHA class 2 ([place]) 04/2015   a. 12/2015 Echo: EF 30-35%.  . Hyperlipidemia   . Hypertensive heart disease   . Ischemic cardiomyopathy    a. 04/2015 Echo: EF 25%, LVH, trivial MR, dilated thoracic aorta, anteroapical and apical akinesis and severely decreased contraction overall; c. 12/2015 Echo: EF 30-35%, mod conc LVH, mid-apicalanteroseptal, anterior, and apical AK, mildly dil LA.  . Tobacco abuse     Current Outpatient Prescriptions on File Prior to Visit  Medication Sig Dispense Refill  . acetaminophen (TYLENOL) 325 MG tablet Take 650 mg by mouth every 6 (six) hours as needed.    Marland Kitchen amLODipine (NORVASC) 5 MG tablet Take 1 tablet (5 mg total) by mouth daily. 90 tablet 2  . aspirin EC 81 MG tablet Take 81 mg by mouth daily.    Marland Kitchen  atorvastatin (LIPITOR) 80 MG tablet Take 1 tablet (80 mg total) by mouth daily. 90 tablet 2  . carvedilol (COREG) 25 MG tablet Take 1 tablet (25 mg total) by mouth 2 (two) times daily. 180 tablet 2  . clopidogrel (PLAVIX) 75 MG tablet Take 1 tablet (75 mg total) by mouth daily. 90 tablet 2  . ezetimibe (ZETIA) 10 MG tablet Take 1 tablet (10 mg total) by mouth daily. 90 tablet 2  . fenofibrate (TRICOR) 145 MG tablet Take 1 tablet (145 mg total) by mouth daily. 30 tablet 6  . furosemide (LASIX) 40 MG tablet Take 1 tablet (40 mg total) by mouth daily. 90 tablet 2  . nitroGLYCERIN (NITROSTAT) 0.4 MG SL tablet Place 1 tablet (0.4 mg total) under the tongue every 5 (five) minutes as needed for chest pain. 25 tablet 2  . Omega-3 Fatty Acids (FISH OIL) 1000 MG CPDR Take 1,000 mg by mouth 2 (two) times daily. 60 capsule 0  . potassium chloride SA (K-DUR,KLOR-CON) 20 MEQ tablet Take 1 tablet (20 mEq total) by mouth daily. 90 tablet 2  . spironolactone (ALDACTONE) 25 MG tablet Take 1 tablet by mouth daily.     No current facility-administered medications on file prior to visit.     No Known Allergies  Assessment/Plan: Hyperlipidemia:  LDL not at goal <70.    Thank you,  Lelan Pons. Patterson [facility], [facility]  04/15/2017 9:10 AM

## 2017-04-19 IMAGING — DX DG CHEST 1V PORT
1 series · 1 of 1 positions shown · non-contrast
Comparison: None.

CLINICAL DATA: Shortness of breath. History of myocardial
infarction and coronary artery disease.

EXAM:
PORTABLE CHEST 1 VIEW

[chest ap]
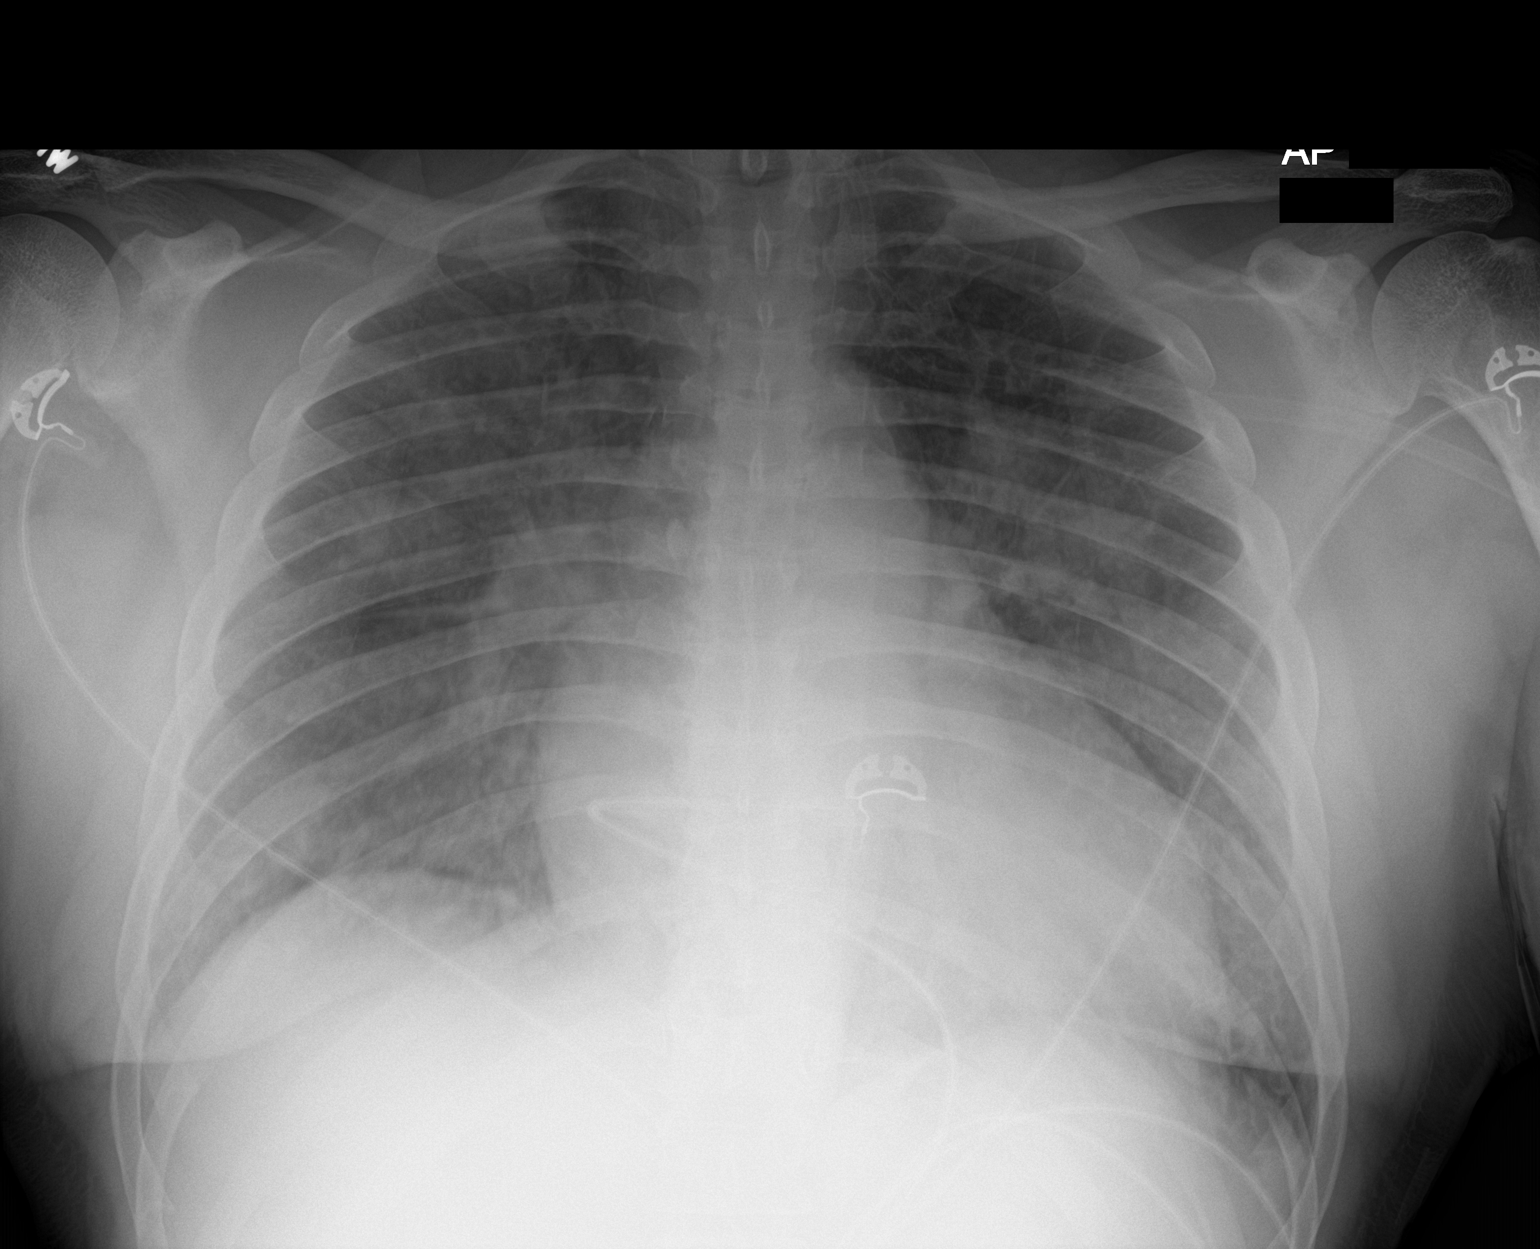

[1 of 1 positions shown; findings below may reference images not displayed]

FINDINGS: The heart is moderately enlarged. There is evidence of pulmonary
edema diffusely throughout both lungs without evidence of
significant pleural fluid.
IMPRESSION: Cardiac enlargement and diffuse pulmonary edema, likely reflecting
congestive heart failure

## 2017-04-21 ENCOUNTER — Other Ambulatory Visit: Payer: PRIVATE HEALTH INSURANCE

## 2017-05-04 ENCOUNTER — Telehealth: Payer: Self-pay | Admitting: Internal Medicine

## 2017-05-04 NOTE — Telephone Encounter (Signed)
Pt returning call

## 2017-05-18 ENCOUNTER — Other Ambulatory Visit: Payer: PRIVATE HEALTH INSURANCE

## 2017-05-19 ENCOUNTER — Ambulatory Visit: Payer: PRIVATE HEALTH INSURANCE | Admitting: Internal Medicine

## 2017-10-19 ENCOUNTER — Other Ambulatory Visit: Payer: Self-pay | Admitting: Cardiology

## 2018-09-26 ENCOUNTER — Other Ambulatory Visit: Payer: Self-pay | Admitting: Internal Medicine

## 2018-09-26 DIAGNOSIS — I255 Ischemic cardiomyopathy: Secondary | ICD-10-CM

## 2018-09-26 NOTE — Telephone Encounter (Signed)
Patient needs an appointment before refills can be sent in.

## 2018-10-11 ENCOUNTER — Other Ambulatory Visit: Payer: Self-pay

## 2018-10-11 DIAGNOSIS — I255 Ischemic cardiomyopathy: Secondary | ICD-10-CM

## 2018-10-11 DIAGNOSIS — I10 Essential (primary) hypertension: Secondary | ICD-10-CM

## 2018-10-11 MED ORDER — FUROSEMIDE 40 MG PO TABS: 40.0000 mg | ORAL_TABLET | Freq: Every day | ORAL | 0 refills | Status: DC

## 2018-10-11 MED ORDER — POTASSIUM CHLORIDE CRYS ER 20 MEQ PO TBCR: 20.0000 meq | EXTENDED_RELEASE_TABLET | Freq: Every day | ORAL | 0 refills | Status: DC

## 2018-10-11 MED ORDER — AMLODIPINE BESYLATE 5 MG PO TABS: 5.0000 mg | ORAL_TABLET | Freq: Every day | ORAL | 0 refills | Status: DC

## 2018-10-11 MED ORDER — EZETIMIBE 10 MG PO TABS: 10.0000 mg | ORAL_TABLET | Freq: Every day | ORAL | 0 refills | Status: DC

## 2018-10-11 MED ORDER — CLOPIDOGREL BISULFATE 75 MG PO TABS: 75.0000 mg | ORAL_TABLET | Freq: Every day | ORAL | 0 refills | Status: DC

## 2018-10-11 MED ORDER — CARVEDILOL 25 MG PO TABS: 25.0000 mg | ORAL_TABLET | Freq: Two times a day (BID) | ORAL | 0 refills | Status: DC

## 2018-10-11 MED ORDER — SPIRONOLACTONE 25 MG PO TABS: 25.0000 mg | ORAL_TABLET | Freq: Every day | ORAL | 0 refills | Status: DC

## 2018-10-11 MED ORDER — ATORVASTATIN CALCIUM 80 MG PO TABS: 80.0000 mg | ORAL_TABLET | Freq: Every day | ORAL | 0 refills | Status: DC

## 2018-10-11 MED ORDER — FENOFIBRATE 145 MG PO TABS: 145.0000 mg | ORAL_TABLET | Freq: Every day | ORAL | 0 refills | Status: DC

## 2018-10-11 NOTE — Telephone Encounter (Signed)
Requested Prescriptions   Signed Prescriptions Disp Refills  . atorvastatin (LIPITOR) 80 MG tablet 90 tablet 0    Sig: Take 1 tablet (80 mg total) by mouth daily.    Authorizing Provider: END, CHRISTOPHER    Ordering User: Janan Ridge carvedilol (COREG) 25 MG tablet 180 tablet 0    Sig: Take 1 tablet (25 mg total) by mouth 2 (two) times daily.    Authorizing Provider: END, CHRISTOPHER    Ordering User: Janan Ridge clopidogrel (PLAVIX) 75 MG tablet 90 tablet 0    Sig: Take 1 tablet (75 mg total) by mouth daily.    Authorizing Provider: END, CHRISTOPHER    Ordering User: Janan Ridge ezetimibe (ZETIA) 10 MG tablet 90 tablet 0    Sig: Take 1 tablet (10 mg total) by mouth daily.    Authorizing Provider: END, CHRISTOPHER    Ordering User: Janan Ridge fenofibrate (TRICOR) 145 MG tablet 90 tablet 0    Sig: Take 1 tablet (145 mg total) by mouth daily.    Authorizing Provider: END, CHRISTOPHER    Ordering User: Janan Ridge furosemide (LASIX) 40 MG tablet 90 tablet 0    Sig: Take 1 tablet (40 mg total) by mouth daily.    Authorizing Provider: END, CHRISTOPHER    Ordering User: Janan Ridge potassium chloride SA (KLOR-CON M20) 20 MEQ tablet 90 tablet 0    Sig: Take 1 tablet (20 mEq total) by mouth daily.    Authorizing Provider: END, CHRISTOPHER    Ordering User: Janan Ridge spironolactone (ALDACTONE) 25 MG tablet 90 tablet 0    Sig: Take 1 tablet (25 mg total) by mouth daily.    Authorizing Provider: END, CHRISTOPHER    Ordering User: Janan Ridge amLODipine (NORVASC) 5 MG tablet 90 tablet 0    Sig: Take 1 tablet (5 mg total) by mouth daily.    Authorizing Provider: END, CHRISTOPHER    Ordering User: Janan Ridge

## 2018-10-12 ENCOUNTER — Telehealth: Payer: Self-pay | Admitting: *Deleted

## 2018-10-12 DIAGNOSIS — I255 Ischemic cardiomyopathy: Secondary | ICD-10-CM

## 2018-10-12 NOTE — Telephone Encounter (Signed)
-----   Message from Blain Pais sent at 10/11/2018  3:24 PM EST ----- Regarding: RE: PLEASE ADVISE Can you place the order please? ----- Message ----- From: Valora Corporal, RN Sent: 10/07/2018   5:38 PM EST To: Blain Pais Subject: RE: PLEASE ADVISE                              He can have the echo. Thanks ----- Message ----- From: Lucienne Minks F Sent: 10/07/2018   9:32 AM EST To: [facility] Triage Subject: PLEASE ADVISE                                  This pt had an echo scheduled for 2018, but did not show, he is scheduled for 1/24, please advise if pt is ok to have this test , or needs to be seen 1st.

## 2018-10-14 ENCOUNTER — Other Ambulatory Visit: Payer: Self-pay | Admitting: Internal Medicine

## 2018-10-14 ENCOUNTER — Ambulatory Visit (INDEPENDENT_AMBULATORY_CARE_PROVIDER_SITE_OTHER): Payer: 59

## 2018-10-14 ENCOUNTER — Other Ambulatory Visit: Payer: Self-pay

## 2018-10-14 DIAGNOSIS — I255 Ischemic cardiomyopathy: Secondary | ICD-10-CM

## 2018-10-14 DIAGNOSIS — I10 Essential (primary) hypertension: Secondary | ICD-10-CM

## 2018-10-14 NOTE — Telephone Encounter (Signed)
°*  STAT* If patient is at the pharmacy, call can be transferred to refill team.   1. Which medications need to be refilled? (please list name of each medication and dose if known)          All of them - patient aware he should call back with list   2. Which pharmacy/location (including street and city if local pharmacy) is medication to be sent to? [facility]   3. Do they need a 30 day or 90 day supply? [place]

## 2018-10-19 ENCOUNTER — Ambulatory Visit: Payer: PRIVATE HEALTH INSURANCE | Admitting: Nurse Practitioner

## 2018-10-19 DIAGNOSIS — R0989 Other specified symptoms and signs involving the circulatory and respiratory systems: Secondary | ICD-10-CM

## 2018-10-19 NOTE — Telephone Encounter (Signed)
*  STAT* If patient is at the pharmacy, call can be transferred to refill team.   1. Which medications need to be refilled? (please list name of each medication and dose if known)   Carvedilol 25 mg po once daily   Furosemide 40 mg po q daily   Klor con 20 mEq po q d   Clopidogrel 75 mg po q d   Ezetimibe 10 po q d   Atorvastatin 80 mg po q d   Amlodipine 5 mg po q d   Spironolactone 25 mg po q d   2. Which pharmacy/location (including street and city if local pharmacy) is medication to be sent to? [facility]   3. Do they need a 30 day or 90 day supply? 90 Patient is out of meds

## 2018-10-19 NOTE — Telephone Encounter (Signed)
Attempted to call patient to verify which [facility] he would like his prescriptions to go too.  No answer, No voicemail.

## 2018-10-20 ENCOUNTER — Ambulatory Visit: Payer: 59 | Admitting: Podiatry

## 2018-10-20 ENCOUNTER — Encounter: Payer: Self-pay | Admitting: Nurse Practitioner

## 2018-10-24 ENCOUNTER — Telehealth: Payer: Self-pay | Admitting: *Deleted

## 2018-10-24 NOTE — Telephone Encounter (Signed)
-----   Message from Nelva Bush, MD sent at 10/24/2018  6:59 AM EST ----- LVEF still severely reduced, consistent with history of ischemic cardiomyopathy.  Unfortunately, patient has not been seen in the office since 11/2016 and was a no-show for follow-up visit with Ignacia Bayley, NP, last week.  Please reschedule the patient to see me or an APP at his earliest convenience to reassess his heart failure regimen and discuss referral to EP for ICD placement.

## 2018-10-24 NOTE — Telephone Encounter (Signed)
One ring and went straight to message saying, "This person does not have a voicemail that has been set up yet, please try your call again later."   Tried calling wife who is listed on DPR. No answer. Left message to call back.

## 2018-11-09 ENCOUNTER — Encounter: Payer: Self-pay | Admitting: *Deleted

## 2018-11-09 NOTE — Telephone Encounter (Signed)
Patient's wife, ok per DPR, calling back. She verbalized understanding of results and scheduled appointment for 11/25/18. This was the soonest available as patient's works shifts different days of the week. She will let the patient know these things.  Will hold off on sending the letter since talked to wife.

## 2018-11-09 NOTE — Telephone Encounter (Signed)
No answer to the numbers listed. Left messages to call back.   Letter with results and for patient to contact office mailed to patient.

## 2018-11-25 NOTE — Progress Notes (Signed)
Office Visit    Patient Name: Jeff Cobb Date of Encounter: 11/29/2018  Primary Care Provider:  Luciana Axe, NP Primary Cardiologist:  Nelva Bush, MD  Chief Complaint    48 year old male with a history of CAD status post prior LAD stenting in 2016, ischemic cardiomyopathy with an EF of less than 20%, HFrEF, hypertension, and hyperlipidemia, who presents for follow-up related to heart failure and persistent LV dysfunction.  Past Medical History    Past Medical History:  Diagnosis Date  . CAD S/P percutaneous coronary angioplasty 04/2015   a. 04/2015 Anterior STEMI/PCI @ [facility]: LM nl, mLAD 100% (3.0x15 Xience DES), LCx nl, OM1-3 nl, dRCA 50%, PDA nl.  . Chronic combined systolic and diastolic heart failure, NYHA class 2 ([place]) 04/2015   a. 12/2015 Echo: EF 30-35%.  . Hyperlipidemia   . Hypertensive heart disease   . Ischemic cardiomyopathy    a. 04/2015 Echo: EF 25%, LVH, trivial MR, dilated thoracic aorta, anteroapical and apical akinesis and severely decreased contraction overall; c. 12/2015 Echo: EF 30-35%, mod conc LVH, mid-apicalanteroseptal, anterior, and apical AK, mildly dil LA.  . Tobacco abuse    Past Surgical History:  Procedure Laterality Date  . CORONARY ANGIOPLASTY WITH STENT PLACEMENT Left 04/2015   @ [facility]: mLAD 100% --> 3.0 x 15 Xience DES  . TRANSTHORACIC ECHOCARDIOGRAM  04/2015   @ [facility]: EF 25%, LVH, trivial MR, dilated thoracic aorta, anteroapical and apical akinesis and severely decreased contraction overall  . TRANSTHORACIC ECHOCARDIOGRAM  12/2015   Echo: EF 30-35%, mod conc LVH, mid-apicalanteroseptal, anterior, and apical AK, mildly dil LA    Allergies  No Known Allergies  History of Present Illness    48 year old male with the above complex past medical history.  He suffered an anterior STEMI in August 2016 and required drug-eluting stent placement to the LAD at [facility].  He subsequently was noted to have an ischemic cardiomyopathy and HFrEF, with  an EF of 25% at that time.  Subsequent echo in 2017 showed an EF of 30 to 35%.  His other history includes hypertensive heart disease, hyperlipidemia, and tobacco abuse.  He was last seen in clinic in March 2018, at which time, we had planned optimize his medicines but follow-up blood work showed renal insufficiency and therefore Entresto and spironolactone were held.  He then was lost to follow-up and now says that he lost his insurance but regained it earlier this year.  He was supposed to have had an echocardiogram in 2018 to reevaluate LV function and determine candidacy for ICD however, this was not performed until January of this year.  This showed an EF of less than 20%.  Since his last visit in 2018, patient says he has been doing well.  He denies chest pain, dyspnea, palpitations, PND, orthopnea, dizziness, syncope, edema, or early satiety.  He notes that he had been off of all of his medications for at least a year prior to his recent echo and was only taking his medicines for a day or 2 at the time of the echo.  It does not appear that he has had any blood work since 2018 and he confirms this.  Home Medications    Prior to Admission medications   Medication Sig Start Date End Date Taking? Authorizing Provider  acetaminophen (TYLENOL) 325 MG tablet Take 650 mg by mouth every 6 (six) hours as needed.    [provider]  amLODipine (NORVASC) 5 MG tablet Take 1 tablet (5  mg total) by mouth daily. 10/11/18   End, Harrell Gave, MD  aspirin EC 81 MG tablet Take 81 mg by mouth daily.    [provider]  atorvastatin (LIPITOR) 80 MG tablet Take 1 tablet (80 mg total) by mouth daily. 10/11/18   End, Harrell Gave, MD  carvedilol (COREG) 25 MG tablet Take 1 tablet (25 mg total) by mouth 2 (two) times daily. 10/11/18   End, Harrell Gave, MD  clopidogrel (PLAVIX) 75 MG tablet Take 1 tablet (75 mg total) by mouth daily. 10/11/18   End, Harrell Gave, MD  ezetimibe (ZETIA) 10 MG tablet Take 1  tablet (10 mg total) by mouth daily. 10/11/18   End, Harrell Gave, MD  fenofibrate (TRICOR) 145 MG tablet Take 1 tablet (145 mg total) by mouth daily. 10/11/18   End, Harrell Gave, MD  furosemide (LASIX) 40 MG tablet Take 1 tablet (40 mg total) by mouth daily. 10/11/18   End, Harrell Gave, MD  nitroGLYCERIN (NITROSTAT) 0.4 MG SL tablet PLACE 1 TABLET (0.4 MG TOTAL) UNDER THE TONGUE EVERY 5 (FIVE) MINUTES AS NEEDED FOR CHEST PAIN. 10/19/17   End, Harrell Gave, MD  Omega-3 Fatty Acids (FISH OIL) 1000 MG CPDR Take 1,000 mg by mouth 2 (two) times daily. 12/24/16   End, Harrell Gave, MD  potassium chloride SA (KLOR-CON M20) 20 MEQ tablet Take 1 tablet (20 mEq total) by mouth daily. 10/11/18   End, Harrell Gave, MD  spironolactone (ALDACTONE) 25 MG tablet Take 1 tablet (25 mg total) by mouth daily. 10/11/18   End, Harrell Gave, MD    Review of Systems    Doing well since his last visit in 2018.  He denies chest pain, palpitations, dyspnea, pnd, orthopnea, n, v, dizziness, syncope, edema, weight gain, or early satiety. All other systems reviewed and are otherwise negative except as noted above.  Physical Exam    VS:  BP 118/82 (BP Location: Left Arm, Patient Position: Sitting, Cuff Size: Normal)   Pulse 81   Ht 5\' 7"  (1.702 m)   Wt 208 lb (94.3 kg)   BMI 32.58 kg/m  , BMI Body mass index is 32.58 kg/m. GEN: Well nourished, well developed, in no acute distress. HEENT: normal. Neck: Supple, no JVD, carotid bruits, or masses. Cardiac: RRR, no murmurs, rubs, or gallops. No clubbing, cyanosis, edema.  Radials/PT 2+ and equal bilaterally.  Respiratory:  Respirations regular and unlabored, clear to auscultation bilaterally. GI: Soft, nontender, nondistended, BS + x 4. MS: no deformity or atrophy. Skin: warm and dry, no rash. Neuro:  Strength and sensation are intact. Psych: Normal affect.  Accessory Clinical Findings    ECG personally reviewed by me today -regular sinus rhythm, 81, PVC, left axis deviation,  prior anterior and inferior infarcts with lateral T wave inversion- no acute changes.  Assessment & Plan    1.  Coronary artery disease: Status post prior anterior MI with LAD stenting in 2016.  He has not been experiencing any chest pain or dyspnea and now is back on aspirin, statin, beta-blocker, Zetia, and fish oil therapy.  2.  HFrEF/ischemic cardiomyopathy: He recently underwent echocardiography in January which continued to show persistent LV dysfunction with an EF of less than 20%.  Unfortunately, at that time, he had been off of all of his medications for a year and says he only had just started taking them about a day or 2 beforehand.  Thus, medical therapy was not optimized at the time of the echo.  That being the case, I will defer EP referral for now.  I  am going to follow-up lab work today as it has been nearly 2 years since the last time he had labs.  Continue beta-blocker, Lasix, and Spironolactone.  Pending lab work, will reconsider ACE/ARB/Entresto.  Once medical therapy optimized, we can consider repeat echo at some point in the near future.  We discussed the importance of daily weights, sodium restriction, medication compliance, and symptom reporting and he verbalizes understanding.   3.  Hypertensive heart disease: Stable.  4.  Hyperlipidemia: He is on TriCor, fish oil, and Lipitor.  Following up lipids and LFTs today.  5.  Disposition: Follow-up CBC, basic metabolic panel, LFTs, and lipids today.  Follow-up in clinic in 2 to 3 months or sooner if necessary, at which time we can consider repeat echocardiography.  Yetta Glassman am acting as a Education administrator for Murray Hodgkins, NP.  I have reviewed the above documentation for accuracy and completeness, and I agree with the above.   Murray Hodgkins, NP 11/29/2018, 5:57 PM

## 2018-11-29 ENCOUNTER — Ambulatory Visit (INDEPENDENT_AMBULATORY_CARE_PROVIDER_SITE_OTHER): Payer: 59 | Admitting: Nurse Practitioner

## 2018-11-29 ENCOUNTER — Encounter: Payer: Self-pay | Admitting: Nurse Practitioner

## 2018-11-29 VITALS — BP 118/82 | HR 81 | Ht 67.0 in | Wt 208.0 lb

## 2018-11-29 DIAGNOSIS — I5042 Chronic combined systolic (congestive) and diastolic (congestive) heart failure: Secondary | ICD-10-CM | POA: Diagnosis not present

## 2018-11-29 DIAGNOSIS — E785 Hyperlipidemia, unspecified: Secondary | ICD-10-CM

## 2018-11-29 DIAGNOSIS — I110 Hypertensive heart disease with heart failure: Secondary | ICD-10-CM

## 2018-11-29 DIAGNOSIS — I255 Ischemic cardiomyopathy: Secondary | ICD-10-CM | POA: Diagnosis not present

## 2018-11-29 DIAGNOSIS — I2510 Atherosclerotic heart disease of native coronary artery without angina pectoris: Secondary | ICD-10-CM | POA: Diagnosis not present

## 2018-11-29 NOTE — Patient Instructions (Addendum)
Medication Instructions:  Your physician recommends that you continue on your current medications as directed. Please refer to the Current Medication list given to you today.  If you need a refill on your cardiac medications before your next appointment, please call your pharmacy.   Lab work: Your physician recommends that you have lab work today(CBC, CMET, Lipid)   If you have labs (blood work) drawn today and your tests are completely normal, you will receive your results only by: Marland Kitchen MyChart Message (if you have MyChart) OR . A paper copy in the mail If you have any lab test that is abnormal or we need to change your treatment, we will call you to review the results.  Testing/Procedures: None ordered   Follow-Up: At [facility], you and your health needs are our priority.  As part of our continuing mission to provide you with exceptional heart care, we have created designated Provider Care Teams.  These Care Teams include your primary Cardiologist (physician) and Advanced Practice Providers (APPs -  Physician Assistants and Nurse Practitioners) who all work together to provide you with the care you need, when you need it. You will need a follow up appointment in 3 months.  You may see Nelva Bush, MD or Murray Hodgkins, NP.

## 2018-11-30 ENCOUNTER — Telehealth: Payer: Self-pay | Admitting: Nurse Practitioner

## 2018-11-30 DIAGNOSIS — E876 Hypokalemia: Secondary | ICD-10-CM

## 2018-11-30 LAB — COMPREHENSIVE METABOLIC PANEL
ALT: 26 IU/L (ref 0–44)
AST: 26 IU/L (ref 0–40)
Albumin/Globulin Ratio: 1.5 (ref 1.2–2.2)
Albumin: 4.2 g/dL (ref 4.0–5.0)
Alkaline Phosphatase: 54 IU/L (ref 39–117)
BUN/Creatinine Ratio: 13 (ref 9–20)
BUN: 16 mg/dL (ref 6–24)
Bilirubin Total: 0.5 mg/dL (ref 0.0–1.2)
CO2: 23 mmol/L (ref 20–29)
Calcium: 9.7 mg/dL (ref 8.7–10.2)
Chloride: 105 mmol/L (ref 96–106)
Creatinine, Ser: 1.2 mg/dL (ref 0.76–1.27)
GFR calc Af Amer: 84 mL/min/{1.73_m2} (ref >59)
GFR calc non Af Amer: 73 mL/min/{1.73_m2} (ref >59)
Globulin, Total: 2.8 g/dL (ref 1.5–4.5)
Glucose: 99 mg/dL (ref 65–99)
Potassium: 3.2 mmol/L — ABNORMAL LOW (ref 3.5–5.2)
Sodium: 146 mmol/L — ABNORMAL HIGH (ref 134–144)
Total Protein: 7 g/dL (ref 6.0–8.5)

## 2018-11-30 LAB — CBC
Hematocrit: 34.8 % — ABNORMAL LOW (ref 37.5–51.0)
Hemoglobin: 12.8 g/dL — ABNORMAL LOW (ref 13.0–17.7)
MCH: 33.3 pg — ABNORMAL HIGH (ref 26.6–33.0)
MCHC: 36.8 g/dL — ABNORMAL HIGH (ref 31.5–35.7)
MCV: 91 fL (ref 79–97)
Platelets: 264 10*3/uL (ref 150–450)
RBC: 3.84 x10E6/uL — ABNORMAL LOW (ref 4.14–5.80)
RDW: 13.6 % (ref 11.6–15.4)
WBC: 5.7 10*3/uL (ref 3.4–10.8)

## 2018-11-30 LAB — LIPID PANEL
Chol/HDL Ratio: 2.2 ratio (ref 0.0–5.0)
Cholesterol, Total: 162 mg/dL (ref 100–199)
HDL: 73 mg/dL (ref >39)
LDL Calculated: 68 mg/dL (ref 0–99)
Triglycerides: 107 mg/dL (ref 0–149)
VLDL Cholesterol Cal: 21 mg/dL (ref 5–40)

## 2018-11-30 MED ORDER — POTASSIUM CHLORIDE CRYS ER 20 MEQ PO TBCR: 40.0000 meq | EXTENDED_RELEASE_TABLET | Freq: Every day | ORAL | Status: DC

## 2018-11-30 NOTE — Telephone Encounter (Signed)
Notes recorded by Theora Gianotti, NP on 11/30/2018 at 3:24 PM EDT Mildly anemic - should f/u with pcp. Kidney fxn wnl. K is low @ 3.2. Please increase KCl to 44meq daily. LDL @ goal - cont current dose of statin. Will need f/u bmet next week to re-eval K.

## 2018-11-30 NOTE — Telephone Encounter (Signed)
I spoke with the patient regarding his lab results from 11/29/18. He states he currently does not have a PCP, but is trying to establish with one.  He is aware of Jeff Bayley, NP's recommendations to: 1) increase potassium 20 meq- take 2 tablets (40 meq) once daily 2) repeat a BMP in 1 week  The patient states he is ok on his potassium RX right now. He is aware I will update his medication list and we can send in the new RX for potassium when he is ready. He will go to the [place] in 1 week for a repeat BMP.   The patient verbalizes understanding of the above and is agreeable.

## 2018-12-21 ENCOUNTER — Telehealth: Payer: Self-pay

## 2018-12-21 NOTE — Telephone Encounter (Signed)
Call attempted to reschedule patient to a telephone or video visit instead of a face to face visit on 01/02/2019 with Christell Faith, PA-C due to current clinic policies related to COVID 19 virus precautions. Unable to leave a message-voice mailbox not set up yet.

## 2018-12-22 ENCOUNTER — Telehealth: Payer: Self-pay

## 2018-12-22 NOTE — Telephone Encounter (Signed)
Attempted to call patient again to reschedule his 01/02/2019 office visit to a telephone or video visit due to current clinic policies related to [place] 19 virus precautions however the [employer] recording states that "this number has calling restrictions that have presented the completion of your call". Unable to speak with patient or leave a voicemail message.

## 2018-12-27 NOTE — Telephone Encounter (Signed)
    Virtual Visit Pre-Appointment Phone Call   1. Confirm consent - "In the setting of the current Covid19 crisis, you are scheduled for a (phone or video) visit with your provider on (date) at (time).  Just as we do with many in-office visits, in order for you to participate in this visit, we must obtain consent.  If you'd like, I can send this to your mychart (if signed up) or email for you to review.  Otherwise, I can obtain your verbal consent now.  All virtual visits are billed to your insurance company just like a normal visit would be.  By agreeing to a virtual visit, we'd like you to understand that the technology does not allow for your provider to perform an examination, and thus may limit your provider's ability to fully assess your condition.  Finally, though the technology is pretty good, we cannot assure that it will always work on either your or our end, and in the setting of a video visit, we may have to convert it to a phone-only visit.  In either situation, we cannot ensure that we have a secure connection.  Are you willing to proceed?"  I informed patient they will receive a phone call 15 minutes prior to their appointment time (may be from unknown caller ID) so they should be prepared to answer    TELEPHONE CALL NOTE  Jeff Cobb has been deemed a candidate for a follow-up tele-health visit to limit community exposure during the Covid-19 pandemic. I spoke with the patient via phone to ensure availability of phone/video source, confirm preferred email & phone number, and discuss instructions and expectations.  I reminded Jeff Cobb to be prepared with any vital sign and/or heart rhythm information that could potentially be obtained via home monitoring, at the time of his visit. I reminded Jeff Cobb to expect a phone call at the time of his visit if his visit.  Did the patient verbally acknowledge consent to treatment? Wants emailed, then will call back.  Jeff Au, RN 12/27/2018  9:23 AM

## 2018-12-30 NOTE — Telephone Encounter (Signed)
Attempted to obtain consent for evisit.  No ans no vm

## 2018-12-30 NOTE — Telephone Encounter (Signed)
Final attempt to reach patient to change OV from in person to tele. No answer, no VM available.   Appt cancelled and routed to scheduling pool for recall.

## 2019-01-02 ENCOUNTER — Ambulatory Visit: Payer: 59 | Admitting: Physician Assistant

## 2019-01-04 NOTE — Telephone Encounter (Signed)
Recall entered  °

## 2019-01-12 ENCOUNTER — Other Ambulatory Visit: Payer: Self-pay | Admitting: Internal Medicine

## 2019-01-12 DIAGNOSIS — I10 Essential (primary) hypertension: Secondary | ICD-10-CM

## 2019-01-12 DIAGNOSIS — I255 Ischemic cardiomyopathy: Secondary | ICD-10-CM

## 2019-09-14 ENCOUNTER — Other Ambulatory Visit: Payer: Self-pay | Admitting: Internal Medicine

## 2019-09-14 DIAGNOSIS — I255 Ischemic cardiomyopathy: Secondary | ICD-10-CM

## 2019-12-30 ENCOUNTER — Other Ambulatory Visit: Payer: Self-pay | Admitting: Internal Medicine

## 2019-12-30 DIAGNOSIS — I255 Ischemic cardiomyopathy: Secondary | ICD-10-CM

## 2020-02-13 NOTE — Progress Notes (Deleted)
Cardiology Office Note    Date:  02/13/2020   ID:  Jeff Cobb, DOB [birth year], MRN [id number]  PCP:  Luciana Axe, NP  Cardiologist:  Nelva Bush, MD  Electrophysiologist:  None   Chief Complaint: Follow-up  History of Present Illness:   Jeff Cobb is a 49 y.o. male with history of CAD status post prior LAD stenting in 2016, HFrEF secondary to ICM, HTN, HLD, and tobacco use who presents for follow-up of his CAD and HFrEF.  He was admitted to the hospital in 04/2015 with an anterior ST elevation MI at [facility] treated with PCI/DES to the LAD.  He was subsequently noted to have ICM with an EF of 25% at that time.  Subsequent echo in 12/2015 showed an EF of 30 to 35%, akinesis of the mid apical anteroseptal, anterior, and apical myocardium, normal LV diastolic function, mildly dilated left atrium, trivial mitral regurgitation, normal size aortic root, normal RV systolic function and ventricular cavity size.  Despite medical therapy, repeat echo in 06/2016 showed an EF of 25 to 30%, severe anterior, anteroseptal, and apical wall hypokinesis, grade 1 diastolic dysfunction, normal RV systolic function, normal PASP.  Previously, optimization of GDMT with Entresto and spironolactone has been held in the setting of renal insufficiency noted in 2018.  Following this, he was lost to follow-up until he was seen in 11/2018 with noted echo from 09/2018 demonstrating an EF of less than 20%.  At his last visit in 11/2018 he reported he was doing well and had been off all his medications for at least 1 year prior to his echo in 09/2018 and was only taking his medications for a day or 2 at the time of the echo.  His weight at that visit was noted to be 208 pounds.  Given he had been off all medications for approximately 1 year EP referral was deferred with plans to optimize GDMT followed by repeating of the echo to evaluate his EF on maximally tolerated optimal medical therapy.  ***   Labs independently  reviewed: 11/2018 - TC 162, TG 107, HDL 73, LDL 68, potassium 3.2, BUN 16, serum creatinine 1.2, albumin 4.2, AST/ALT normal, Hgb 12.8, PLT 264 04/2015 - TSH normal  Past Medical History:  Diagnosis Date  . CAD S/P percutaneous coronary angioplasty 04/2015   a. 04/2015 Anterior STEMI/PCI @ [facility]: LM nl, mLAD 100% (3.0x15 Xience DES), LCx nl, OM1-3 nl, dRCA 50%, PDA nl.  . Chronic combined systolic and diastolic heart failure, NYHA class 2 ([place]) 04/2015   a. 12/2015 Echo: EF 30-35%; b. 09/2018 Echo: EF <20%, sev HK (was not taking meds for ~ 1 yr @ this time)  . Hyperlipidemia   . Hypertensive heart disease   . Ischemic cardiomyopathy    a. 04/2015 Echo: EF 25%, b. 12/2015 Echo: EF 30-35%; c. 09/2018 Echo: EF <20% (not taking meds).  . Tobacco abuse     Past Surgical History:  Procedure Laterality Date  . CORONARY ANGIOPLASTY WITH STENT PLACEMENT Left 04/2015   @ [facility]: mLAD 100% --> 3.0 x 15 Xience DES  . TRANSTHORACIC ECHOCARDIOGRAM  04/2015   @ [facility]: EF 25%, LVH, trivial MR, dilated thoracic aorta, anteroapical and apical akinesis and severely decreased contraction overall  . TRANSTHORACIC ECHOCARDIOGRAM  12/2015   Echo: EF 30-35%, mod conc LVH, mid-apicalanteroseptal, anterior, and apical AK, mildly dil LA    Current Medications: No outpatient medications have been marked as taking for the 02/16/20 encounter (Appointment) with Christell Faith  M, PA-C.    Allergies:   Patient has no known allergies.   Social History   Socioeconomic History  . Marital status: Married    Spouse name: Not on file  . Number of children: Not on file  . Years of education: Not on file  . Highest education level: Not on file  Occupational History  . Occupation: unemployed  Tobacco Use  . Smoking status: Current Some Day Smoker    Packs/day: 0.10  . Smokeless tobacco: Never Used  Substance and Sexual Activity  . Alcohol use: Yes    Alcohol/week: 2.0 standard drinks    Types: 2 Shots of liquor per week  .  Drug use: Yes    Types: Marijuana  . Sexual activity: Not on file  Other Topics Concern  . Not on file  Social History Narrative  . Not on file   Social Determinants of Health   Financial Resource Strain:   . Difficulty of Paying Living Expenses:   Food Insecurity:   . Worried About Charity fundraiser in the Last Year:   . Arboriculturist in the Last Year:   Transportation Needs:   . Film/video editor (Medical):   Marland Kitchen Lack of Transportation (Non-Medical):   Physical Activity:   . Days of Exercise per Week:   . Minutes of Exercise per Session:   Stress:   . Feeling of Stress :   Social Connections:   . Frequency of Communication with Friends and Family:   . Frequency of Social Gatherings with Friends and Family:   . Attends Religious Services:   . Active Member of Clubs or Organizations:   . Attends Archivist Meetings:   Marland Kitchen Marital Status:      Family History:  The patient's family history includes Diabetes Mellitus II in his father; Heart attack in his father; Heart disease in his father.  ROS:   ROS   EKGs/Labs/Other Studies Reviewed:    Studies reviewed were summarized above. The additional studies were reviewed today:  2D echo 12/2015: - Left ventricle: The cavity size was moderately dilated. There was  moderate concentric hypertrophy. Systolic function was moderately  to severely reduced. The estimated ejection fraction was in the  range of 30% to 35%. Akinesis of the mid-apicalanteroseptal,  anterior, and apical myocardium. Left ventricular diastolic  function parameters were normal.  - Left atrium: The atrium was mildly dilated. __________  2D echo 06/2016: - Left ventricle: The cavity size was mildly dilated. Systolic  function was severely reduced. The estimated ejection fraction  was in the range of 25% to 30%. Severe anterior, anteroseptal and  apical wall hypokinesis. Doppler parameters are consistent with  abnormal  left ventricular relaxation (grade 1 diastolic  dysfunction).  - Left atrium: The atrium was normal in size.  - Right ventricle: Systolic function was normal.  - Pulmonary arteries: Systolic pressure was within the normal  range.   Impressions:   - Unable to exclude mural thrombus in the apical region. __________  2D echo 09/2018: - Left ventricle: The cavity size was severely dilated. Systolic  function was severely reduced. The estimated ejection fraction  was less than 20% Severe global hypokinesis. Wall motion of basal  to mid inferior wall best preserved. The study is not technically  sufficient to allow evaluation of LV diastolic function.  - Mitral valve: There was mild regurgitation.  - Right ventricle: Systolic function was normal.  - Pulmonary arteries: Systolic pressure could not  be accurately  estimated.    EKG:  EKG is ordered today.  The EKG ordered today demonstrates ***  Recent Labs: No results found for requested labs within last 8760 hours.  Recent Lipid Panel    Component Value Date/Time   CHOL 162 11/29/2018 1054   TRIG 107 11/29/2018 1054   HDL 73 11/29/2018 1054   CHOLHDL 2.2 11/29/2018 1054   CHOLHDL NOT REPORTED DUE TO HIGH TRIGLYCERIDES 03/16/2017 0734   VLDL UNABLE TO CALCULATE IF TRIGLYCERIDE OVER 400 mg/dL 03/16/2017 0734   LDLCALC 68 11/29/2018 1054   LDLDIRECT 70 12/23/2016 0725    PHYSICAL EXAM:    VS:  There were no vitals taken for this visit.  BMI: There is no height or weight on file to calculate BMI.  Physical Exam  Wt Readings from Last 3 Encounters:  11/29/18 208 lb (94.3 kg)  12/17/16 218 lb 4 oz (99 kg)  04/22/16 198 lb 12 oz (90.2 kg)     ASSESSMENT & PLAN:   1. ***  Disposition: F/u with Dr. Saunders Revel or an APP in ***.   Medication Adjustments/Labs and Tests Ordered: Current medicines are reviewed at length with the patient today.  Concerns regarding medicines are outlined above. Medication changes, Labs and  Tests ordered today are summarized above and listed in the Patient Instructions accessible in Encounters.   SignedChristell Faith, PA-C 02/13/2020 11:42 AM     [facility] [address] [place] [address] [place], [place] [address] [phone]

## 2020-02-16 ENCOUNTER — Ambulatory Visit: Payer: 59 | Admitting: Physician Assistant

## 2020-02-20 ENCOUNTER — Encounter: Payer: Self-pay | Admitting: Physician Assistant

## 2020-04-22 ENCOUNTER — Other Ambulatory Visit: Payer: Self-pay | Admitting: *Deleted

## 2020-04-22 DIAGNOSIS — I255 Ischemic cardiomyopathy: Secondary | ICD-10-CM

## 2020-04-22 NOTE — Telephone Encounter (Signed)
Received fax request from pharmacy for refill on Furosemide 40 mg. Rx was denied with notation for patient to contact office for appointment and further refills. Patient did not have repeat labs done from last visit. Message sent to scheduling to schedule appointment for patient.

## 2020-04-25 DIAGNOSIS — M25579 Pain in unspecified ankle and joints of unspecified foot: Secondary | ICD-10-CM | POA: Diagnosis not present

## 2020-04-25 DIAGNOSIS — M25572 Pain in left ankle and joints of left foot: Secondary | ICD-10-CM | POA: Diagnosis not present

## 2020-04-25 DIAGNOSIS — M109 Gout, unspecified: Secondary | ICD-10-CM | POA: Diagnosis not present

## 2020-04-30 ENCOUNTER — Telehealth: Payer: Self-pay | Admitting: Internal Medicine

## 2020-04-30 NOTE — Telephone Encounter (Signed)
Attempted to schedule no ans no vm  

## 2020-05-08 NOTE — Progress Notes (Signed)
Office Visit    Patient Name: Jeff Cobb Date of Encounter: 05/09/2020  Primary Care Provider:  Luciana Axe, NP Primary Cardiologist:  Nelva Bush, MD Electrophysiologist:  None   Chief Complaint    Jeff Cobb is a 49 y.o. male with a hx of CAD s/p prior LAD stenting in 2016, ischemic cardiomyopathy with EF less than 20%, HFrEF, HTN, HLD presents today for CAD, heart failure  Past Medical History    Past Medical History:  Diagnosis Date  . CAD S/P percutaneous coronary angioplasty 04/2015   a. 04/2015 Anterior STEMI/PCI @ [facility]: LM nl, mLAD 100% (3.0x15 Xience DES), LCx nl, OM1-3 nl, dRCA 50%, PDA nl.  . Chronic combined systolic and diastolic heart failure, NYHA class 2 ([place]) 04/2015   a. 12/2015 Echo: EF 30-35%; b. 09/2018 Echo: EF <20%, sev HK (was not taking meds for ~ 1 yr @ this time)  . Hyperlipidemia   . Hypertensive heart disease   . Ischemic cardiomyopathy    a. 04/2015 Echo: EF 25%, b. 12/2015 Echo: EF 30-35%; c. 09/2018 Echo: EF <20% (not taking meds).  . Tobacco abuse    Past Surgical History:  Procedure Laterality Date  . CORONARY ANGIOPLASTY WITH STENT PLACEMENT Left 04/2015   @ [facility]: mLAD 100% --> 3.0 x 15 Xience DES  . TRANSTHORACIC ECHOCARDIOGRAM  04/2015   @ [facility]: EF 25%, LVH, trivial MR, dilated thoracic aorta, anteroapical and apical akinesis and severely decreased contraction overall  . TRANSTHORACIC ECHOCARDIOGRAM  12/2015   Echo: EF 30-35%, mod conc LVH, mid-apicalanteroseptal, anterior, and apical AK, mildly dil LA    Allergies  No Known Allergies  History of Present Illness    Jeff Cobb is a 49 y.o. male with a hx of CAD s/p prior LAD stenting in 2016, ischemic cardiomyopathy with EF less than 20%, HFrEF, HTN, HLD.  He was last seen 11/29/2018 by Ignacia Bayley, NP.  He suffered an anterior STEMI August 2016 requiring DES to LAD at [facility].  Subsequently noted to have ischemic cardiomyopathy and HFrEF with EF 25% at the time.  Subsequent echo in  2017 with EF 30-35%.  Seen in clinic 3/28 gene and follow-up blood work showed renal insufficiency and therefore Entresto and spironolactone were held.  He was then lost to follow-up.  Echo January 2020 with EF less than 20%.  He was seen in clinic 11/29/2018 noted to be off all of his medications for a year prior to that echocardiogram. His atorvastatin 80 mg, Coreg 25 mg twice daily, Plavix 75 mg, Zetia 10 mg, fenofibrate 145 mg, Lasix 40 mg, spironolactone 25 mg were continued.  Lab work was collected. His potassium was increased to 42mEq daily,   Reports intermittent compliance with his heart failure medications over the last year.  Reports BP at home routinely 132/80.  He is planning to switch to the primary care provider his wife sees but has not yet scheduled an appointment.  He was last seen by his previous care provider greater than 2 years ago.  He is working night shift at [employer] and reports this is very active job.  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  Reports no dyspnea on exertion.  He reports only mild shortness of breath when outdoors only if it is very hot out.  Reports no chest pain, pressure, or tightness. No edema, orthopnea, PND. Reports no palpitations.  Of note he did not take his medications this morning prior to clinic visit.  EKGs/Labs/Other Studies Reviewed:   The following studies were reviewed today:  Echo 09/2018 Left ventricle: The cavity size was severely dilated. Systolic    function was severely reduced. The estimated ejection fraction    was less than 20% Severe global hypokinesis. Wall motion of basal    to mid inferior wall best preserved. The study is not technically    sufficient to allow evaluation of LV diastolic function.  - Mitral valve: There was mild regurgitation.  - Right ventricle: Systolic function was normal.  - Pulmonary arteries: Systolic pressure could not be  accurately    estimated.   EKG:  EKG is ordered today.  The ekg ordered today demonstrates sinus tachycardia 109 bpm with prior inferior and anterior infarct.  Compared to previous EKG 11/2018 lateral T WI has resolved.  No acute ST/T wave changes.  Recent Labs: No results found for requested labs within last 8760 hours.  Recent Lipid Panel    Component Value Date/Time   CHOL 162 11/29/2018 1054   TRIG 107 11/29/2018 1054   HDL 73 11/29/2018 1054   CHOLHDL 2.2 11/29/2018 1054   CHOLHDL NOT REPORTED DUE TO HIGH TRIGLYCERIDES 03/16/2017 0734   VLDL UNABLE TO CALCULATE IF TRIGLYCERIDE OVER 400 mg/dL 03/16/2017 0734   LDLCALC 68 11/29/2018 1054   LDLDIRECT 70 12/23/2016 0725    Home Medications   Current Meds  Medication Sig  . acetaminophen (TYLENOL) 325 MG tablet Take 650 mg by mouth every 6 (six) hours as needed.  Marland Kitchen amLODipine (NORVASC) 5 MG tablet TAKE 1 TABLET BY MOUTH EVERY DAY  . aspirin EC 81 MG tablet Take 81 mg by mouth daily.  Marland Kitchen atorvastatin (LIPITOR) 80 MG tablet TAKE 1 TABLET BY MOUTH EVERY DAY  . carvedilol (COREG) 25 MG tablet TAKE 1 TABLET BY MOUTH TWICE DAILY  . clopidogrel (PLAVIX) 75 MG tablet TAKE 1 TABLET BY MOUTH EVERY DAY  . ezetimibe (ZETIA) 10 MG tablet TAKE 1 TABLET BY MOUTH EVERY DAY  . fenofibrate (TRICOR) 145 MG tablet Take 1 tablet (145 mg total) by mouth daily.  . furosemide (LASIX) 40 MG tablet TAKE 1 TABLET BY MOUTH EVERY DAY  . nitroGLYCERIN (NITROSTAT) 0.4 MG SL tablet PLACE 1 TABLET (0.4 MG TOTAL) UNDER THE TONGUE EVERY 5 (FIVE) MINUTES AS NEEDED FOR CHEST PAIN.  Marland Kitchen Omega-3 Fatty Acids (FISH OIL) 1000 MG CPDR Take 1,000 mg by mouth 2 (two) times daily.  . potassium chloride SA (KLOR-CON M20) 20 MEQ tablet Take 2 tablets (40 mEq total) by mouth daily.  Marland Kitchen spironolactone (ALDACTONE) 25 MG tablet TAKE 1 TABLET BY MOUTH EVERY DAY    Review of Systems     Review of Systems  Constitutional: Negative for chills, fever and malaise/fatigue.    Cardiovascular: Negative for chest pain, dyspnea on exertion, leg swelling, near-syncope, orthopnea, palpitations and syncope.  Respiratory: Negative for cough, shortness of breath and wheezing.   Gastrointestinal: Negative for nausea and vomiting.  Neurological: Negative for dizziness, light-headedness and weakness.   All other systems reviewed and are otherwise negative except as noted above.  Physical Exam    VS:  BP 122/74   Pulse (!) 109   Ht 5\' 7"  (1.702 m)   Wt 195 lb 12.8 oz (88.8 kg)   SpO2 97%   BMI 30.67 kg/m  , BMI Body mass index is 30.67 kg/m. GEN: Well nourished, well developed, in no acute  distress. HEENT: normal. Neck: Supple, no JVD, carotid bruits, or masses. Cardiac: RRR, no murmurs, rubs, or gallops. No clubbing, cyanosis, edema.  Radials/DP/PT 2+ and equal bilaterally.  Respiratory:  Respirations regular and unlabored, clear to auscultation bilaterally. GI: Soft, nontender, nondistended, BS + x 4. MS: No deformity or atrophy. Skin: Warm and dry, no rash. Neuro:  Strength and sensation are intact. Psych: Normal affect.  Assessment & Plan    1. CAD s/p prior STEMI with LAD stenting in 2016 -EKG with prior infarct and no acute ST/T wave changes.  No anginal symptoms, no indication for ischemic evaluation this time.  GDMT includes aspirin, beta-blocker, statin, PRN Nitroglycerin.  CMP, CBC today for monitoring.  Will defer medication refills until results received.  Low-sodium, healthy diet encouraged.  Regular cardiovascular exercise encouraged..  2. HFrEF/ischemic cardiomyopathy - Echo 09/2018 EF <20% in setting of being off cardiac medications for one year.  Last seen in clinic 11/2018.  Reports intermittent compliance with medications since that time.  He did not take his medications this morning.  NYHA I.  Plan for echocardiogram for reassessment of LVEF.  With LVEF remains lower than 35% will need referral to EP for consideration of ICD.  GDMT includes Coreg,  Lasix, spironolactone.  ACE/ARB/ARNI previously discontinued due to impaired renal function.  CMP today.  If renal function normal we will plan to transition amlodipine to [facility].  3. HLD, LDL goal <70 -lipid panel and CMP today for monitoring.  Present lipid-lowering regimen includes atorvastatin 80 mg, Zetia 10 mg, fenofibrate 145 mg.  We will send refills pending results of lab work.  Disposition: CMP, CBC, lipid panel today. Reassessment of Rx based on blood work. Echocardiogram scheduled. Follow up after echocardiogram with Dr. Saunders Revel or APP.  Jeff Dubonnet, NP 05/09/2020, 11:35 AM

## 2020-05-09 ENCOUNTER — Ambulatory Visit (INDEPENDENT_AMBULATORY_CARE_PROVIDER_SITE_OTHER): Payer: BC Managed Care – PPO | Admitting: Family

## 2020-05-09 ENCOUNTER — Encounter: Payer: Self-pay | Admitting: Family

## 2020-05-09 ENCOUNTER — Other Ambulatory Visit: Payer: Self-pay

## 2020-05-09 VITALS — BP 122/74 | HR 109 | Ht 67.0 in | Wt 195.8 lb

## 2020-05-09 DIAGNOSIS — I5042 Chronic combined systolic (congestive) and diastolic (congestive) heart failure: Secondary | ICD-10-CM

## 2020-05-09 DIAGNOSIS — E785 Hyperlipidemia, unspecified: Secondary | ICD-10-CM | POA: Diagnosis not present

## 2020-05-09 DIAGNOSIS — I255 Ischemic cardiomyopathy: Secondary | ICD-10-CM

## 2020-05-09 DIAGNOSIS — Z9861 Coronary angioplasty status: Secondary | ICD-10-CM

## 2020-05-09 DIAGNOSIS — I2510 Atherosclerotic heart disease of native coronary artery without angina pectoris: Secondary | ICD-10-CM

## 2020-05-09 NOTE — Patient Instructions (Signed)
Medication Instructions:  Continue your current medications.   Depending your kidney function we may start a new medication called Entresto. We will call you tomorrow with your lab work and recommendations for any medication changes.  *If you need a refill on your cardiac medications before your next appointment, please call your pharmacy*   Lab Work: Your physician recommends that you return for lab work in: CMET, CBC, lipid panel  This will check your blood counts, cholesterol, liver, kidneys, and electrolytes.   If you have labs (blood work) drawn today and your tests are completely normal, you will receive your results only by: Marland Kitchen MyChart Message (if you have MyChart) OR . A paper copy in the mail If you have any lab test that is abnormal or we need to change your treatment, we will call you to review the results.  Testing/Procedures: Your physician has requested that you have an echocardiogram. Echocardiography is a painless test that uses sound waves to create images of your heart. It provides your doctor with information about the size and shape of your heart and how well your heart's chambers and valves are working. This procedure takes approximately one hour. There are no restrictions for this procedure.  Follow-Up: At [facility], you and your health needs are our priority.  As part of our continuing mission to provide you with exceptional heart care, we have created designated Provider Care Teams.  These Care Teams include your primary Cardiologist (physician) and Advanced Practice Providers (APPs -  Physician Assistants and Nurse Practitioners) who all work together to provide you with the care you need, when you need it.  We recommend signing up for the patient portal called "MyChart".  Sign up information is provided on this After Visit Summary.  MyChart is used to connect with patients for Virtual Visits (Telemedicine).  Patients are able to view lab/test results, encounter  notes, upcoming appointments, etc.  Non-urgent messages can be sent to your provider as well.   To learn more about what you can do with MyChart, go to [email or web address].    Your next appointment:   After the echocardiogram  The format for your next appointment:   In Person  Provider:    You may see Nelva Bush, MD or one of the following Advanced Practice Providers on your designated Care Team:    Murray Hodgkins, NP  Christell Faith, PA-C  Marrianne Mood, PA-C  Laurann Montana, NP  Other Instructions    Sacubitril; Valsartan Oral Tablets What is this medicine? SACUBITRIL; VALSARTAN (sak UE bi tril; val SAR tan) is a combination of a neprilysin inhibitor and a an angiotensin II receptor blocker. It treats heart failure. This medicine may be used for other purposes; ask your health care provider or pharmacist if you have questions. COMMON BRAND NAME(S): Entresto What should I tell my health care provider before I take this medicine? They need to know if you have any of these conditions:  diabetes and take a medicine that contains aliskiren  kidney disease  liver disease  an unusual or allergic reaction to sacubitril; valsartan, drugs called angiotensin converting enzyme (ACE) inhibitors, angiotensin II receptor blockers (ARBs), other medicines, foods, dyes, or preservatives  pregnant or trying to get pregnant  breast-feeding How should I use this medicine? Take this drug by mouth. Take it as directed on the prescription label at the same time every day. You can take it with or without food. If it upsets your stomach, take it with food. Keep  taking it unless your health care provider tells you to stop. Talk to your health care provider about the use of this drug in children. While it may be prescribed for children as young as 1 for selected conditions, precautions do apply. Overdosage: If you think you have taken too much of this medicine contact a poison control  center or emergency room at once. NOTE: This medicine is only for you. Do not share this medicine with others. What if I miss a dose? If you miss a dose, take it as soon as you can. If it is almost time for your next dose, take only that dose. Do not take double or extra doses. What may interact with this medicine? Do not take this medicine with any of the following medicines:  aliskiren if you have diabetes  angiotensin-converting enzyme (ACE) inhibitors, like benazepril, captopril, enalapril, fosinopril, lisinopril, or ramipril This medicine may also interact with the following medicines:  angiotensin II receptor blockers (ARBs) like azilsartan, candesartan, eprosartan, irbesartan, losartan, olmesartan, telmisartan, or valsartan  lithium  NSAIDS, medicines for pain and inflammation, like ibuprofen or naproxen  potassium-sparing diuretics like amiloride, spironolactone, and triamterene  potassium supplements This list may not describe all possible interactions. Give your health care provider a list of all the medicines, herbs, non-prescription drugs, or dietary supplements you use. Also tell them if you smoke, drink alcohol, or use illegal drugs. Some items may interact with your medicine. What should I watch for while using this medicine? Tell your doctor or healthcare professional if your symptoms do not start to get better or if they get worse. Do not become pregnant while taking this medicine. Women should inform their doctor if they wish to become pregnant or think they might be pregnant. There is a potential for serious side effects to an unborn child. Talk to your health care professional or pharmacist for more information. You may get dizzy. Do not drive, use machinery, or do anything that needs mental alertness until you know how this medicine affects you. Do not stand or sit up quickly, especially if you are an older patient. This reduces the risk of dizzy or fainting spells.  Avoid alcoholic drinks; they can make you more dizzy. What side effects may I notice from receiving this medicine? Side effects that you should report to your doctor or health care professional as soon as possible:  allergic reactions like skin rash, itching or hives, swelling of the face, lips, or tongue  signs and symptoms of increased potassium like muscle weakness; chest pain; or fast, irregular heartbeat  signs and symptoms of kidney injury like trouble passing urine or change in the amount of urine  signs and symptoms of low blood pressure like feeling dizzy or lightheaded, or if you develop extreme fatigue Side effects that usually do not require medical attention (report to your doctor or health care professional if they continue or are bothersome):  cough This list may not describe all possible side effects. Call your doctor for medical advice about side effects. You may report side effects to FDA at 1-800-FDA-1088. Where should I keep my medicine? Keep out of the reach of children and pets. Store at room temperature between 20 and 25 degrees C (68 and 77 degrees F). Protect from moisture. Keep the container tightly closed. Throw away any unused drug after the expiration date. NOTE: This sheet is a summary. It may not cover all possible information. If you have questions about this medicine, talk to your  doctor, pharmacist, or health care provider.  2020 Elsevier/Gold Standard (2019-04-12 16:03:07)

## 2020-05-10 ENCOUNTER — Telehealth: Payer: Self-pay | Admitting: Family

## 2020-05-10 DIAGNOSIS — E781 Pure hyperglyceridemia: Secondary | ICD-10-CM

## 2020-05-10 DIAGNOSIS — E876 Hypokalemia: Secondary | ICD-10-CM

## 2020-05-10 DIAGNOSIS — I10 Essential (primary) hypertension: Secondary | ICD-10-CM

## 2020-05-10 DIAGNOSIS — E785 Hyperlipidemia, unspecified: Secondary | ICD-10-CM

## 2020-05-10 DIAGNOSIS — I5042 Chronic combined systolic (congestive) and diastolic (congestive) heart failure: Secondary | ICD-10-CM

## 2020-05-10 DIAGNOSIS — I255 Ischemic cardiomyopathy: Secondary | ICD-10-CM

## 2020-05-10 LAB — CBC
Hematocrit: 38.6 % (ref 37.5–51.0)
Hemoglobin: 14.1 g/dL (ref 13.0–17.7)
MCH: 34.3 pg — ABNORMAL HIGH (ref 26.6–33.0)
MCHC: 36.5 g/dL — ABNORMAL HIGH (ref 31.5–35.7)
MCV: 94 fL (ref 79–97)
Platelets: 235 10*3/uL (ref 150–450)
RBC: 4.11 x10E6/uL — ABNORMAL LOW (ref 4.14–5.80)
RDW: 14.2 % (ref 11.6–15.4)
WBC: 11.1 10*3/uL — ABNORMAL HIGH (ref 3.4–10.8)

## 2020-05-10 LAB — LIPID PANEL
Chol/HDL Ratio: 2.6 ratio (ref 0.0–5.0)
Cholesterol, Total: 315 mg/dL — ABNORMAL HIGH (ref 100–199)
HDL: 122 mg/dL (ref >39)
LDL Chol Calc (NIH): 156 mg/dL — ABNORMAL HIGH (ref 0–99)
Triglycerides: 210 mg/dL — ABNORMAL HIGH (ref 0–149)
VLDL Cholesterol Cal: 37 mg/dL (ref 5–40)

## 2020-05-10 LAB — COMPREHENSIVE METABOLIC PANEL
ALT: 21 IU/L (ref 0–44)
AST: 25 IU/L (ref 0–40)
Albumin/Globulin Ratio: 1.5 (ref 1.2–2.2)
Albumin: 4.7 g/dL (ref 4.0–5.0)
Alkaline Phosphatase: 62 IU/L (ref 48–121)
BUN/Creatinine Ratio: 15 (ref 9–20)
BUN: 26 mg/dL — ABNORMAL HIGH (ref 6–24)
Bilirubin Total: 2.3 mg/dL — ABNORMAL HIGH (ref 0.0–1.2)
CO2: 22 mmol/L (ref 20–29)
Calcium: 9.8 mg/dL (ref 8.7–10.2)
Chloride: 95 mmol/L — ABNORMAL LOW (ref 96–106)
Creatinine, Ser: 1.72 mg/dL — ABNORMAL HIGH (ref 0.76–1.27)
GFR calc Af Amer: 54 mL/min/{1.73_m2} — ABNORMAL LOW (ref >59)
GFR calc non Af Amer: 46 mL/min/{1.73_m2} — ABNORMAL LOW (ref >59)
Globulin, Total: 3.2 g/dL (ref 1.5–4.5)
Glucose: 93 mg/dL (ref 65–99)
Potassium: 2.7 mmol/L — ABNORMAL LOW (ref 3.5–5.2)
Sodium: 139 mmol/L (ref 134–144)
Total Protein: 7.9 g/dL (ref 6.0–8.5)

## 2020-05-10 MED ORDER — FENOFIBRATE 145 MG PO TABS: 145.0000 mg | ORAL_TABLET | Freq: Every day | ORAL | 0 refills | Status: DC

## 2020-05-10 MED ORDER — POTASSIUM CHLORIDE CRYS ER 20 MEQ PO TBCR: 20.0000 meq | EXTENDED_RELEASE_TABLET | Freq: Two times a day (BID) | ORAL | Status: DC

## 2020-05-10 MED ORDER — ATORVASTATIN CALCIUM 80 MG PO TABS: 80.0000 mg | ORAL_TABLET | Freq: Every day | ORAL | 1 refills | Status: DC

## 2020-05-10 MED ORDER — SPIRONOLACTONE 25 MG PO TABS: 25.0000 mg | ORAL_TABLET | Freq: Every day | ORAL | 1 refills | Status: DC

## 2020-05-10 MED ORDER — AMLODIPINE BESYLATE 5 MG PO TABS: 5.0000 mg | ORAL_TABLET | Freq: Every day | ORAL | 2 refills | Status: DC

## 2020-05-10 MED ORDER — CARVEDILOL 25 MG PO TABS: 25.0000 mg | ORAL_TABLET | Freq: Two times a day (BID) | ORAL | 1 refills | Status: DC

## 2020-05-10 MED ORDER — EZETIMIBE 10 MG PO TABS: 10.0000 mg | ORAL_TABLET | Freq: Every day | ORAL | 1 refills | Status: DC

## 2020-05-10 NOTE — Telephone Encounter (Signed)
Called to discuss lab work results. Concern for medication noncompliance due to abnormal lab results including hypokalemia and markedly elevated LDL. He reports compliance with all of his medications. Assures me he takes regularly though agreeable to review medications via phone. [facility] called to add on magnesium to lab collection.   Hypokalemia with K 2.7 - Unsure how he is taking his Spironolactone. Take Lasix 40mg  daily. Takes Potassium 35mEq daily (This was prescribed for dose 40 mEq daily).    Lipid panel with total cholesterol 315 and LDL 156 - Reports compliance with all of his cholesterol medications though does not recognize names of the medications. Endorse he might have missed a dose or two. Plan for repeat fasting lipid panel in approx 6-8 weeks. Will discuss at follow up.    WBC 11.1, RBC 4.11. RBC stable compared to previous. Reports no signs or symptoms of infection.  Creatinine 1.72, GFR 54 CKD 3a. Impaired compared to last year, but stable compared to results over the years.  Instructions reviewed with patient: As he assures me he is taking his medications as prescribed including Spironolactone, Lasix. HOLD Lasix for 3 days. CHANGE Potassium to 47mEq twice per day (presently taking once per day). He will present to [place] Monday for repeat BMP and magnesium. Orders placed. Will defer resumption of Lasix until lab results reviewed Monday.  Very unclear where he is filling his medications he assures me he is taking.   Called [facility] [phone] - Reports last filled 90 day supply of Lasix 01/26/20 but no other medications filled at that location.   Called [facility] [phone] - Reports they have not filled any medications for him in the last 2 years.  Refill sent for: Amlodipine 5mg  QD, Atorvastatin 80mg  QD, Carvedilol 25mg  BID, Zetia 10mg  QD, Fenofibrate 145mg  QD, Potassium 38mEq BID Discontinued: Plavix as he is >1 year from intervention, Fish Oil Held: Lasix until repeat  BMP/magnesium on Monday  Loel Dubonnet, NP

## 2020-05-13 ENCOUNTER — Telehealth: Payer: Self-pay

## 2020-05-13 MED ORDER — MAGNESIUM OXIDE 400 MG PO CAPS: 400.0000 mg | ORAL_CAPSULE | Freq: Every day | ORAL | 3 refills | Status: DC

## 2020-05-13 NOTE — Telephone Encounter (Signed)
-----   Message from Loel Dubonnet, NP sent at 05/11/2020  1:25 PM EDT ----- Magnesium level low. Important to get magnesium level to goal as it will help [place] to get potassium level normalized as well. Start Magnesium 400mg  daily.

## 2020-05-13 NOTE — Telephone Encounter (Signed)
Call to patient to review labs.    Pt verbalized understanding and has no further questions at this time.    Advised pt to call for any further questions or concerns.  rx updated as requested.

## 2020-05-23 ENCOUNTER — Other Ambulatory Visit: Payer: BC Managed Care – PPO

## 2020-05-30 ENCOUNTER — Other Ambulatory Visit: Payer: BC Managed Care – PPO

## 2020-06-01 LAB — MAGNESIUM: Magnesium: 1.4 mg/dL — ABNORMAL LOW (ref 1.6–2.3)

## 2020-06-01 LAB — SPECIMEN STATUS REPORT

## 2020-06-04 NOTE — Progress Notes (Deleted)
Office Visit    Patient Name: Jeff Cobb Date of Encounter: 06/04/2020  Primary Care Provider:  Luciana Axe, NP Primary Cardiologist:  Nelva Bush, MD Electrophysiologist:  None   Chief Complaint    Jeff Cobb is a 49 y.o. male with a hx of CAD s/p prior LAD stenting in 2016, ischemic cardiomyopathy with EF less than 20%, HFrEF, HTN, HLD presents today for CAD, heart failure  Past Medical History    Past Medical History:  Diagnosis Date  . CAD S/P percutaneous coronary angioplasty 04/2015   a. 04/2015 Anterior STEMI/PCI @ [facility]: LM nl, mLAD 100% (3.0x15 Xience DES), LCx nl, OM1-3 nl, dRCA 50%, PDA nl.  . Chronic combined systolic and diastolic heart failure, NYHA class 2 ([place]) 04/2015   a. 12/2015 Echo: EF 30-35%; b. 09/2018 Echo: EF <20%, sev HK (was not taking meds for ~ 1 yr @ this time)  . Hyperlipidemia   . Hypertensive heart disease   . Ischemic cardiomyopathy    a. 04/2015 Echo: EF 25%, b. 12/2015 Echo: EF 30-35%; c. 09/2018 Echo: EF <20% (not taking meds).  . Tobacco abuse    Past Surgical History:  Procedure Laterality Date  . CORONARY ANGIOPLASTY WITH STENT PLACEMENT Left 04/2015   @ [facility]: mLAD 100% --> 3.0 x 15 Xience DES  . TRANSTHORACIC ECHOCARDIOGRAM  04/2015   @ [facility]: EF 25%, LVH, trivial MR, dilated thoracic aorta, anteroapical and apical akinesis and severely decreased contraction overall  . TRANSTHORACIC ECHOCARDIOGRAM  12/2015   Echo: EF 30-35%, mod conc LVH, mid-apicalanteroseptal, anterior, and apical AK, mildly dil LA    Allergies  No Known Allergies  History of Present Illness    Jeff Cobb is a 49 y.o. male with a hx of CAD s/p prior LAD stenting in 2016, ischemic cardiomyopathy with EF less than 20%, HFrEF, HTN, HLD.  He was last seen 05/09/20.   He suffered an anterior STEMI August 2016 requiring DES to LAD at [facility].  Subsequently noted to have ischemic cardiomyopathy and HFrEF with EF 25% at the time.  Subsequent echo in 2017 with EF  30-35%.  Seen in clinic 3/28 gene and follow-up blood work showed renal insufficiency and therefore Entresto and spironolactone were held.  He was then lost to follow-up.  Echo January 2020 with EF less than 20%.  He was seen in clinic 11/29/2018 noted to be off all of his medications for a year prior to that echocardiogram. His atorvastatin 80 mg, Coreg 25 mg twice daily, Plavix 75 mg, Zetia 10 mg, fenofibrate 145 mg, Lasix 40 mg, spironolactone 25 mg were continued.  Lab work was collected. His potassium was increased to 63mEq daily due to hypokalemia.  He was lost to follow up. Seen in clinic 05/09/20 with intermittent compliance with his heart failure medications over the last year. He reported good BP control at home and only mild dyspnea if it is very warm outside. Blood work was collected which showed hypokalemia and markedly elevated LDL though he reports medication compliance.  ***  EKGs/Labs/Other Studies Reviewed:   The following studies were reviewed today:  Echo 09/2018 Left ventricle: The cavity size was severely dilated. Systolic    function was severely reduced. The estimated ejection fraction    was less than 20% Severe global hypokinesis. Wall motion of basal    to mid inferior wall best preserved. The study is not technically    sufficient to allow evaluation of LV diastolic function.  - Mitral valve:  There was mild regurgitation.  - Right ventricle: Systolic function was normal.  - Pulmonary arteries: Systolic pressure could not be accurately    estimated.   EKG:  EKG is ordered today.  The ekg ordered today demonstrates sinus tachycardia 109 bpm with prior inferior and anterior infarct.  Compared to previous EKG 11/2018 lateral T WI has resolved.  No acute ST/T wave changes.***  Recent Labs: 05/09/2020: ALT 21; BUN 26; Creatinine, Ser 1.72; Hemoglobin 14.1; Magnesium 1.4; Platelets 235; Potassium 2.7; Sodium 139  Recent Lipid Panel    Component Value Date/Time   CHOL 315  (H) 05/09/2020 0927   TRIG 210 (H) 05/09/2020 0927   HDL 122 05/09/2020 0927   CHOLHDL 2.6 05/09/2020 0927   CHOLHDL NOT REPORTED DUE TO HIGH TRIGLYCERIDES 03/16/2017 0734   VLDL UNABLE TO CALCULATE IF TRIGLYCERIDE OVER 400 mg/dL 03/16/2017 0734   LDLCALC 156 (H) 05/09/2020 0927   LDLDIRECT 70 12/23/2016 0725    Home Medications   No outpatient medications have been marked as taking for the 06/05/20 encounter (Appointment) with Loel Dubonnet, NP.    Review of Systems    *** ROS  All other systems reviewed and are otherwise negative except as noted above.  Physical Exam   *** VS:  There were no vitals taken for this visit. , BMI There is no height or weight on file to calculate BMI. GEN: Well nourished, well developed, in no acute distress. HEENT: normal. Neck: Supple, no JVD, carotid bruits, or masses. Cardiac: RRR, no murmurs, rubs, or gallops. No clubbing, cyanosis, edema.  Radials/DP/PT 2+ and equal bilaterally.  Respiratory:  Respirations regular and unlabored, clear to auscultation bilaterally. GI: Soft, nontender, nondistended, BS + x 4. MS: No deformity or atrophy. Skin: Warm and dry, no rash. Neuro:  Strength and sensation are intact. Psych: Normal affect.  Assessment & Plan   *** 1. CAD s/p prior STEMI with LAD stenting in 2016 -EKG with prior infarct and no acute ST/T wave changes.  No anginal symptoms, no indication for ischemic evaluation this time.  GDMT includes aspirin, beta-blocker, statin, PRN Nitroglycerin.  CMP, CBC today for monitoring.  Will defer medication refills until results received.  Low-sodium, healthy diet encouraged.  Regular cardiovascular exercise encouraged..  2. HFrEF/ischemic cardiomyopathy - Echo 09/2018 EF <20% in setting of being off cardiac medications for one year.  Last seen in clinic 11/2018.  Reports intermittent compliance with medications since that time.   NYHA I.  Plan for echocardiogram for reassessment of LVEF.  With LVEF  remains lower than 35% will need referral to EP for consideration of ICD.  GDMT includes Coreg, Lasix, spironolactone.  ACE/ARB/ARNI previously discontinued due to impaired renal function.  CMP today.  If renal function normal we will plan to transition amlodipine to [facility].  3. HLD, LDL goal <70 -Lipid panel 05/09/20 total cholesterol 315, triglycerides 156.   Disposition: Follow up*** after echocardiogram with Dr. Saunders Revel or APP.  Loel Dubonnet, NP 06/04/2020, 11:56 AM

## 2020-06-05 ENCOUNTER — Ambulatory Visit: Payer: BC Managed Care – PPO | Admitting: Family

## 2020-08-19 ENCOUNTER — Telehealth: Payer: Self-pay | Admitting: Internal Medicine

## 2020-08-19 NOTE — Telephone Encounter (Signed)
Patient returning call.

## 2020-08-19 NOTE — Telephone Encounter (Signed)
We held Lasix due to K of 2.7 on labs in August. He never had repeat labs nor echocardiogram nor follow up appointment as ordered. We need repeat BMP/magnesium prior to refilling Lasix. We can collect at clinic visit 08/21/20. Please remind him to bring ALL of his pill bottles to his appointment. If he has been taking Lasix I am unclear where he has been picking up the prescription.  Best, Loel Dubonnet, NP

## 2020-08-19 NOTE — Telephone Encounter (Signed)
Patient calling States that he needs a refill on fluid pill - furosemide 40 MG but no longer on medication list  Please review if medication can be refilled  Patient scheduled to see C Walker on Wed. 12/01 Please call to discuss

## 2020-08-19 NOTE — Telephone Encounter (Signed)
Please advise if ok to refill Furosemide 40 mg qd. Furosemide not on current pt medication list but on last OV note medication list. I don't see where medication was D/c pt told to hold for 3 days previous labs 04/2020.

## 2020-08-20 NOTE — Telephone Encounter (Signed)
Patient notified of Caitlin's recommendations and to bring his medication bottles with him to the appointment. He is aware of the date and time and to arrive 15-20 minutes early.

## 2020-08-21 ENCOUNTER — Other Ambulatory Visit: Payer: Self-pay

## 2020-08-21 ENCOUNTER — Encounter: Payer: Self-pay | Admitting: Family

## 2020-08-21 ENCOUNTER — Ambulatory Visit (INDEPENDENT_AMBULATORY_CARE_PROVIDER_SITE_OTHER): Payer: BLUE CROSS/BLUE SHIELD | Admitting: Family

## 2020-08-21 VITALS — BP 114/88 | HR 74 | Ht 67.0 in | Wt 200.0 lb

## 2020-08-21 DIAGNOSIS — I10 Essential (primary) hypertension: Secondary | ICD-10-CM

## 2020-08-21 DIAGNOSIS — I5042 Chronic combined systolic (congestive) and diastolic (congestive) heart failure: Secondary | ICD-10-CM

## 2020-08-21 DIAGNOSIS — E785 Hyperlipidemia, unspecified: Secondary | ICD-10-CM | POA: Diagnosis not present

## 2020-08-21 DIAGNOSIS — Z79899 Other long term (current) drug therapy: Secondary | ICD-10-CM

## 2020-08-21 DIAGNOSIS — I25118 Atherosclerotic heart disease of native coronary artery with other forms of angina pectoris: Secondary | ICD-10-CM | POA: Diagnosis not present

## 2020-08-21 DIAGNOSIS — E876 Hypokalemia: Secondary | ICD-10-CM

## 2020-08-21 MED ORDER — CARVEDILOL 25 MG PO TABS: 25.0000 mg | ORAL_TABLET | Freq: Two times a day (BID) | ORAL | 1 refills | Status: DC

## 2020-08-21 MED ORDER — ATORVASTATIN CALCIUM 80 MG PO TABS: 80.0000 mg | ORAL_TABLET | Freq: Every day | ORAL | 3 refills | Status: DC

## 2020-08-21 NOTE — Patient Instructions (Addendum)
Medication Instructions:  Your provider has recommended you make the following change in your medication:   CONTINUE Aspirin 81mg  daily and Fish Oil  CHANGE Carvedilol (Coreg) 25mg  to TWICE daily *this medication only lasts 12 hours in your system so it is necessary to take twice per day. It helps keep your blood pressure controlled and strengthens your heart*  START Atorvastatin 80mg  daily *This is to help get your bad cholesterol from 156 to less than 70*  The following medications were removed from your list. If you have them at home, please set aside or discard. You do NOT need to take them at this time.  Clopidogrel (Plavix)  Ezetimibe (Zetia)  Fenofibrate (Tricor)  Amlodipine (Norvasc)  We have made changes to your medication list. PLEASE pick up your prescriptions from [place]. They should be available tomorrow 08/22/20 in the afternoon.   *If you need a refill on your cardiac medications before your next appointment, please call your pharmacy*   Lab Work: Your physician recommends lab work today: BMP, magnesium *We will have the results tomorrow and this will determine your dose of Furosemide (Lasix)  If you have labs (blood work) drawn today and your tests are completely normal, you will receive your results only by: Marland Kitchen MyChart Message (if you have MyChart) OR . A paper copy in the mail If you have any lab test that is abnormal or we need to change your treatment, we will call you to review the results.  Testing/Procedures: Your physician has requested that you have an echocardiogram. Echocardiography is a painless test that uses sound waves to create images of your heart. It provides your doctor with information about the size and shape of your heart and how well your heart's chambers and valves are working. This procedure takes approximately one hour. There are no restrictions for this procedure. This will let us know what your heart pumping function is. It was  previously 20-25% January 2020 and we want it to be 50-65%. Knowing what your heart pumping function is lets [place] know how aggressive to be with your medical therapies.    There is a possibility that an IV may need to be started during your test to inject an image enhancing agent. This is done to obtain more optimal pictures of your heart. Therefore we ask that you do at least drink some water prior to coming in to hydrate your veins.    Follow-Up: At [facility], you and your health needs are our priority.  As part of our continuing mission to provide you with exceptional heart care, we have created designated Provider Care Teams.  These Care Teams include your primary Cardiologist (physician) and Advanced Practice Providers (APPs -  Physician Assistants and Nurse Practitioners) who all work together to provide you with the care you need, when you need it.  We recommend signing up for the patient portal called "MyChart".  Sign up information is provided on this After Visit Summary.  MyChart is used to connect with patients for Virtual Visits (Telemedicine).  Patients are able to view lab/test results, encounter notes, upcoming appointments, etc.  Non-urgent messages can be sent to your provider as well.   To learn more about what you can do with MyChart, go to [email or web address].    Your next appointment:   1 month(s) (after echocardiogram)  The format for your next appointment:   In Person  Provider:   You may see Nelva Bush, MD or one of the following Advanced Practice  Providers on your designated Care Team:    Murray Hodgkins, NP  Christell Faith, PA-C  Marrianne Mood, PA-C  Cadence [place], [place]  Laurann Montana, NP  Other Instructions Please do not hesitate to contact our office if you have any questions or concerns regarding your medications or cost.    Echocardiogram An echocardiogram is a procedure that uses painless sound waves (ultrasound) to produce an image of  the heart. Images from an echocardiogram can provide important information about:  Signs of coronary artery disease (CAD).  Aneurysm detection. An aneurysm is a weak or damaged part of an artery wall that bulges out from the normal force of blood pumping through the body.  Heart size and shape. Changes in the size or shape of the heart can be associated with certain conditions, including heart failure, aneurysm, and CAD.  Heart muscle function.  Heart valve function.  Signs of a past heart attack.  Fluid buildup around the heart.  Thickening of the heart muscle.  A tumor or infectious growth around the heart valves. Tell a health care provider about:  Any allergies you have.  All medicines you are taking, including vitamins, herbs, eye drops, creams, and over-the-counter medicines.  Any blood disorders you have.  Any surgeries you have had.  Any medical conditions you have.  Whether you are pregnant or may be pregnant. What are the risks? Generally, this is a safe procedure. However, problems may occur, including:  Allergic reaction to dye (contrast) that may be used during the procedure. What happens before the procedure? No specific preparation is needed. You may eat and drink normally. What happens during the procedure?   An IV tube may be inserted into one of your veins.  You may receive contrast through this tube. A contrast is an injection that improves the quality of the pictures from your heart.  A gel will be applied to your chest.  A wand-like tool (transducer) will be moved over your chest. The gel will help to transmit the sound waves from the transducer.  The sound waves will harmlessly bounce off of your heart to allow the heart images to be captured in real-time motion. The images will be recorded on a computer. The procedure may vary among health care providers and hospitals. What happens after the procedure?  You may return to your normal,  everyday life, including diet, activities, and medicines, unless your health care provider tells you not to do that. Summary  An echocardiogram is a procedure that uses painless sound waves (ultrasound) to produce an image of the heart.  Images from an echocardiogram can provide important information about the size and shape of your heart, heart muscle function, heart valve function, and fluid buildup around your heart.  You do not need to do anything to prepare before this procedure. You may eat and drink normally.  After the echocardiogram is completed, you may return to your normal, everyday life, unless your health care provider tells you not to do that. This information is not intended to replace advice given to you by your health care provider. Make sure you discuss any questions you have with your health care provider. Document Revised: 12/29/2018 Document Reviewed: 10/10/2016 Elsevier Patient Education  [place].

## 2020-08-21 NOTE — Progress Notes (Signed)
Office Visit    Patient Name: Jeff Cobb Date of Encounter: 08/21/2020  Primary Care Provider:  Luciana Axe, NP Primary Cardiologist:  Nelva Bush, MD Electrophysiologist:  None   Chief Complaint    [name] is a 49 y.o. male with a hx of CAD s/p prior LAD stenting in 2016, ischemic cardiomyopathy with EF less than 20%, HFrEF, HTN, HLD presents today for CAD, heart failure  Past Medical History    Past Medical History:  Diagnosis Date  . CAD S/P percutaneous coronary angioplasty 04/2015   a. 04/2015 Anterior STEMI/PCI @ [facility]: LM nl, mLAD 100% (3.0x15 Xience DES), LCx nl, OM1-3 nl, dRCA 50%, PDA nl.  . Chronic combined systolic and diastolic heart failure, NYHA class 2 ([place]) 04/2015   a. 12/2015 Echo: EF 30-35%; b. 09/2018 Echo: EF <20%, sev HK (was not taking meds for ~ 1 yr @ this time)  . Hyperlipidemia   . Hypertensive heart disease   . Ischemic cardiomyopathy    a. 04/2015 Echo: EF 25%, b. 12/2015 Echo: EF 30-35%; c. 09/2018 Echo: EF <20% (not taking meds).  . Tobacco abuse    Past Surgical History:  Procedure Laterality Date  . CORONARY ANGIOPLASTY WITH STENT PLACEMENT Left 04/2015   @ [facility]: mLAD 100% --> 3.0 x 15 Xience DES  . TRANSTHORACIC ECHOCARDIOGRAM  04/2015   @ [facility]: EF 25%, LVH, trivial MR, dilated thoracic aorta, anteroapical and apical akinesis and severely decreased contraction overall  . TRANSTHORACIC ECHOCARDIOGRAM  12/2015   Echo: EF 30-35%, mod conc LVH, mid-apicalanteroseptal, anterior, and apical AK, mildly dil LA    Allergies  No Known Allergies  History of Present Illness    Jeff Cobb is a 49 y.o. male with a hx of CAD s/p prior LAD stenting in 2016, ischemic cardiomyopathy with EF less than 20%, HFrEF, HTN, HLD.  He was last seen 05/09/20.   He suffered an anterior STEMI August 2016 requiring DES to LAD at [facility].  Subsequently noted to have ischemic cardiomyopathy and HFrEF with EF 25% at the time.  Subsequent echo in 2017 with EF  30-35%.  Seen in clinic 3/28 gene and follow-up blood work showed renal insufficiency and therefore Entresto and spironolactone were held.  He was then lost to follow-up.  Echo January 2020 with EF less than 20%.  He was seen in clinic 11/29/2018 noted to be off all of his medications for a year prior to that echocardiogram. His atorvastatin 80 mg, Coreg 25 mg twice daily, Plavix 75 mg, Zetia 10 mg, fenofibrate 145 mg, Lasix 40 mg, spironolactone 25 mg were continued.  Lab work was collected. His potassium was increased to 56mEq daily.  Seen in follow up 05/09/20 with reported intermittent compliance with his heart failure medications over the last year. It was very unclear where he was filling medications as previous [facility] had not filled in 2 years and [facility] had only filled Lasix. He had lab work with hypokalemia 2.7 recommended to hold Lasix and increase Potassium. He did not have repeat labs as recommended.   He presents today for follow-up.  Difficult visit as he reports compliance with his medications however, called his pharmacy today and they tell me the last prescription he picked up was a 90-day supply of furosemide May 2021.  He brings pill bottles in today for review which were all empty and filled in 2018 and 2019.  We discussed the importance of transparency in which medications he is and is not  taking as it affects what changes we make.  He does endorse a has not been taking cholesterol medicine other than an over-the-counter fish oil.  He continues to state that he is taking his Coreg, amlodipine, Lasix though I have low suspicion he is taking any of these medications.  He reports feeling overall well. Reports no shortness of breath nor dyspnea on exertion. Reports no chest pain, pressure, or tightness. No edema, orthopnea, PND. Reports no palpitations. Reports occasional leg cramp but not bothersome.   He stays very active at work and tells me his job can be stressful.  He takes his Lasix in  the evening to prevent having to go to the restroom during the daytime.  His son is in his senior year of high school and recently completed his football season.  EKGs/Labs/Other Studies Reviewed:   The following studies were reviewed today:  Echo 09/2018 Left ventricle: The cavity size was severely dilated. Systolic    function was severely reduced. The estimated ejection fraction    was less than 20% Severe global hypokinesis. Wall motion of basal    to mid inferior wall best preserved. The study is not technically    sufficient to allow evaluation of LV diastolic function.  - Mitral valve: There was mild regurgitation.  - Right ventricle: Systolic function was normal.  - Pulmonary arteries: Systolic pressure could not be accurately    estimated.   EKG: No EKG is ordered today.  The ekg independently reviewed from 05/09/2020 demonstrated sinus tachycardia 109 bpm with prior inferior and anterior infarct.  Compared to previous EKG 11/2018 lateral T WI has resolved.  No acute ST/T wave changes.  Recent Labs: 05/09/2020: ALT 21; BUN 26; Creatinine, Ser 1.72; Hemoglobin 14.1; Magnesium 1.4; Platelets 235; Potassium 2.7; Sodium 139  Recent Lipid Panel    Component Value Date/Time   CHOL 315 (H) 05/09/2020 0927   TRIG 210 (H) 05/09/2020 0927   HDL 122 05/09/2020 0927   CHOLHDL 2.6 05/09/2020 0927   CHOLHDL NOT REPORTED DUE TO HIGH TRIGLYCERIDES 03/16/2017 0734   VLDL UNABLE TO CALCULATE IF TRIGLYCERIDE OVER 400 mg/dL 03/16/2017 0734   LDLCALC 156 (H) 05/09/2020 0927   LDLDIRECT 70 12/23/2016 0725    Home Medications   Current Meds  Medication Sig  . acetaminophen (TYLENOL) 325 MG tablet Take 650 mg by mouth every 6 (six) hours as needed.  Marland Kitchen aspirin EC 81 MG tablet Take 81 mg by mouth daily.  . carvedilol (COREG) 25 MG tablet Take 1 tablet (25 mg total) by mouth 2 (two) times daily.  . cyanocobalamin 1000 MCG tablet Take 3,000 mcg by mouth daily.  . Omega-3 Fatty Acids (FISH OIL PO)  Take by mouth daily.  . [DISCONTINUED] amLODipine (NORVASC) 5 MG tablet Take 1 tablet (5 mg total) by mouth daily.  . [DISCONTINUED] carvedilol (COREG) 25 MG tablet Take 1 tablet (25 mg total) by mouth 2 (two) times daily.  . [DISCONTINUED] clopidogrel (PLAVIX) 75 MG tablet Take 75 mg by mouth daily.  . [DISCONTINUED] ezetimibe (ZETIA) 10 MG tablet Take 1 tablet (10 mg total) by mouth daily.  . [DISCONTINUED] furosemide (LASIX) 40 MG tablet Take 40 mg by mouth daily.  . [DISCONTINUED] POTASSIUM PO Take by mouth daily.    Review of Systems   Review of Systems  Constitutional: Negative for chills, fever and malaise/fatigue.  Cardiovascular: Negative for chest pain, dyspnea on exertion, leg swelling, near-syncope, orthopnea, palpitations and syncope.  Respiratory: Negative for cough, shortness of  breath and wheezing.   Gastrointestinal: Negative for nausea and vomiting.  Neurological: Negative for dizziness, light-headedness and weakness.   All other systems reviewed and are otherwise negative except as noted above.  Physical Exam   VS:  BP 114/88 (BP Location: Left Arm, Patient Position: Sitting, Cuff Size: Large)   Pulse 74   Ht 5\' 7"  (1.702 m)   Wt 200 lb (90.7 kg)   SpO2 98%   BMI 31.32 kg/m  , BMI Body mass index is 31.32 kg/m. GEN: Well nourished, well developed, in no acute distress. HEENT: normal. Neck: Supple, no JVD, carotid bruits, or masses. Cardiac: RRR, no murmurs, rubs, or gallops. No clubbing, cyanosis, edema.  Radials/DP/PT 2+ and equal bilaterally.  Respiratory:  Respirations regular and unlabored, clear to auscultation bilaterally. GI: Soft, nontender, nondistended, BS + x 4. MS: No deformity or atrophy. Skin: Warm and dry, no rash. Neuro:  Strength and sensation are intact. Psych: Normal affect.  Assessment & Plan   1. CAD s/p prior STEMI with LAD stenting in 2016 -  GDMT includes aspirin, beta-blocker, statin, PRN Nitroglycerin.  Denies anginal symptoms.   No indication for ischemic evaluation at this time.  2. HFrEF/ischemic cardiomyopathy - Echo 09/2018 EF <20% in setting of being off cardiac medications for one year.  Last seen in clinic 11/2018. Echo ordered at previous visit but never completed.  Discussed the importance of echocardiogram.  This has been scheduled for 09/12/2020.  If LVEF remains lower than 35% will need referral to EP for consideration of ICD.  GDMT difficult as low suspicion he is taking any of his cardiac medications. ACE/ARB/ARNI previously discontinued due to impaired renal function.   BMP, magenesium today to consider Lasix/Spironolactone dose. Rx provided for Coreg 25mg  BID.  Discussed importance of taking twice daily.   3. Medication management - Today I have removed Plavix, Zetia, fenofibrate, amlodipine from his medication list.  Per discussion with pharmacy he has not filled in greater than 1 year.  Will attempt to simplify medication regimen.  4. HLD, LDL goal <70 - Lipid panel 05/09/2020 total cholesterol 315, triglycerides 210, HDL 122, LDL 156.  Tells me he has only been taking over-the-counter fish oil.  Start atorvastatin 80 mg daily.  He was previously prescribed fenofibrate and Zetia though low suspicion he ever picked up those prescriptions.  Will attempt to simplify medication regimen.  5. Hypokalemia - 04/2020 K 2.7. He never had repeat lab work collected as ordered. BMP, magnesium today.   Disposition:  Follow up in 1 month ( after echocardiogram ) with Dr. Saunders Revel or APP.  Loel Dubonnet, NP 08/21/2020, 3:30 PM

## 2020-08-22 ENCOUNTER — Telehealth: Payer: Self-pay

## 2020-08-22 DIAGNOSIS — Z9861 Coronary angioplasty status: Secondary | ICD-10-CM

## 2020-08-22 DIAGNOSIS — I2510 Atherosclerotic heart disease of native coronary artery without angina pectoris: Secondary | ICD-10-CM

## 2020-08-22 DIAGNOSIS — I110 Hypertensive heart disease with heart failure: Secondary | ICD-10-CM

## 2020-08-22 LAB — BASIC METABOLIC PANEL
BUN/Creatinine Ratio: 17 (ref 9–20)
BUN: 27 mg/dL — ABNORMAL HIGH (ref 6–24)
CO2: 25 mmol/L (ref 20–29)
Calcium: 9.3 mg/dL (ref 8.7–10.2)
Chloride: 92 mmol/L — ABNORMAL LOW (ref 96–106)
Creatinine, Ser: 1.56 mg/dL — ABNORMAL HIGH (ref 0.76–1.27)
GFR calc Af Amer: 60 mL/min/{1.73_m2} (ref >59)
GFR calc non Af Amer: 52 mL/min/{1.73_m2} — ABNORMAL LOW (ref >59)
Glucose: 112 mg/dL — ABNORMAL HIGH (ref 65–99)
Potassium: 3.4 mmol/L — ABNORMAL LOW (ref 3.5–5.2)
Sodium: 133 mmol/L — ABNORMAL LOW (ref 134–144)

## 2020-08-22 LAB — MAGNESIUM: Magnesium: 1.6 mg/dL (ref 1.6–2.3)

## 2020-08-22 MED ORDER — SPIRONOLACTONE 25 MG PO TABS: 12.5000 mg | ORAL_TABLET | Freq: Every day | ORAL | 2 refills | Status: DC

## 2020-08-22 NOTE — Telephone Encounter (Signed)
Able to reach pt regarding his recent lab work, provider Urban Gibson, NP had a chance to review his results and advised   "Renal function improved compared to previous. Potassium and sodium mildly low.  Magnesium normal. Due to electrolyte abnormalities, will not resume Lasix at this time. Instead, will utilize different fluid pill Spironolactone 12.5mg  (half tablet) once daily. This will help keep fluid off but also help normalize his electrolytes. Recommend repeat BMP on date of echocardiogram 09/12/20."  Will stated his will no longer take the prescribe lasix and will pick up his new medication later today or tomorrow.  BMP order has been placed for labs on 09/12/20.  Pt verbalized understanding and all questions or concerns were address and no additional concerns at this time. Agreeable to plan, will call back for anything further.

## 2020-08-22 NOTE — Telephone Encounter (Signed)
-----   Message from Loel Dubonnet, NP sent at 08/22/2020  9:33 AM EST ----- Renal function improved compared to previous. Potassium and sodium mildly low.  Magnesium normal. Due to electrolyte abnormalities, will not resume Lasix at this time. Instead, will utilize different fluid pill Spironolactone 12.5mg  (half tablet) once daily. This will help keep fluid off but also help normalize his electrolytes. Recommend repeat BMP on date of echocardiogram 09/12/20.

## 2020-08-23 DIAGNOSIS — S93492A Sprain of other ligament of left ankle, initial encounter: Secondary | ICD-10-CM | POA: Diagnosis not present

## 2020-09-02 ENCOUNTER — Emergency Department: Payer: BC Managed Care – PPO

## 2020-09-02 ENCOUNTER — Inpatient Hospital Stay
Admission: EM | Admit: 2020-09-02 | Discharge: 2020-09-04 | DRG: 291 | Disposition: A | Discharge status: Home / Self Care | Payer: BC Managed Care – PPO | Attending: Internal Medicine | Admitting: Internal Medicine

## 2020-09-02 ENCOUNTER — Other Ambulatory Visit: Payer: Self-pay

## 2020-09-02 DIAGNOSIS — Z720 Tobacco use: Secondary | ICD-10-CM | POA: Diagnosis not present

## 2020-09-02 DIAGNOSIS — Z8249 Family history of ischemic heart disease and other diseases of the circulatory system: Secondary | ICD-10-CM | POA: Diagnosis not present

## 2020-09-02 DIAGNOSIS — J810 Acute pulmonary edema: Secondary | ICD-10-CM

## 2020-09-02 DIAGNOSIS — I2510 Atherosclerotic heart disease of native coronary artery without angina pectoris: Secondary | ICD-10-CM

## 2020-09-02 DIAGNOSIS — Z20822 Contact with and (suspected) exposure to covid-19: Secondary | ICD-10-CM | POA: Diagnosis present

## 2020-09-02 DIAGNOSIS — Z91138 Patient's unintentional underdosing of medication regimen for other reason: Secondary | ICD-10-CM | POA: Diagnosis not present

## 2020-09-02 DIAGNOSIS — Z9861 Coronary angioplasty status: Secondary | ICD-10-CM | POA: Diagnosis not present

## 2020-09-02 DIAGNOSIS — R000 Tachycardia, unspecified: Secondary | ICD-10-CM | POA: Diagnosis not present

## 2020-09-02 DIAGNOSIS — E785 Hyperlipidemia, unspecified: Secondary | ICD-10-CM | POA: Diagnosis present

## 2020-09-02 DIAGNOSIS — E669 Obesity, unspecified: Secondary | ICD-10-CM | POA: Diagnosis present

## 2020-09-02 DIAGNOSIS — Z7982 Long term (current) use of aspirin: Secondary | ICD-10-CM | POA: Diagnosis not present

## 2020-09-02 DIAGNOSIS — I252 Old myocardial infarction: Secondary | ICD-10-CM

## 2020-09-02 DIAGNOSIS — I5042 Chronic combined systolic (congestive) and diastolic (congestive) heart failure: Secondary | ICD-10-CM | POA: Diagnosis present

## 2020-09-02 DIAGNOSIS — I5082 Biventricular heart failure: Secondary | ICD-10-CM | POA: Diagnosis present

## 2020-09-02 DIAGNOSIS — Z79899 Other long term (current) drug therapy: Secondary | ICD-10-CM | POA: Diagnosis not present

## 2020-09-02 DIAGNOSIS — I255 Ischemic cardiomyopathy: Secondary | ICD-10-CM | POA: Diagnosis present

## 2020-09-02 DIAGNOSIS — R0989 Other specified symptoms and signs involving the circulatory and respiratory systems: Secondary | ICD-10-CM | POA: Diagnosis not present

## 2020-09-02 DIAGNOSIS — F17210 Nicotine dependence, cigarettes, uncomplicated: Secondary | ICD-10-CM | POA: Diagnosis not present

## 2020-09-02 DIAGNOSIS — J9601 Acute respiratory failure with hypoxia: Secondary | ICD-10-CM | POA: Diagnosis present

## 2020-09-02 DIAGNOSIS — I130 Hypertensive heart and chronic kidney disease with heart failure and stage 1 through stage 4 chronic kidney disease, or unspecified chronic kidney disease: Principal | ICD-10-CM | POA: Diagnosis present

## 2020-09-02 DIAGNOSIS — N1832 Chronic kidney disease, stage 3b: Secondary | ICD-10-CM | POA: Diagnosis present

## 2020-09-02 DIAGNOSIS — I5022 Chronic systolic (congestive) heart failure: Secondary | ICD-10-CM | POA: Diagnosis present

## 2020-09-02 DIAGNOSIS — E782 Mixed hyperlipidemia: Secondary | ICD-10-CM | POA: Diagnosis present

## 2020-09-02 DIAGNOSIS — I119 Hypertensive heart disease without heart failure: Secondary | ICD-10-CM | POA: Diagnosis present

## 2020-09-02 DIAGNOSIS — D72829 Elevated white blood cell count, unspecified: Secondary | ICD-10-CM | POA: Diagnosis not present

## 2020-09-02 DIAGNOSIS — R0602 Shortness of breath: Secondary | ICD-10-CM | POA: Diagnosis not present

## 2020-09-02 DIAGNOSIS — R0902 Hypoxemia: Secondary | ICD-10-CM | POA: Diagnosis not present

## 2020-09-02 DIAGNOSIS — T501X6A Underdosing of loop [high-ceiling] diuretics, initial encounter: Secondary | ICD-10-CM | POA: Diagnosis present

## 2020-09-02 DIAGNOSIS — Z6833 Body mass index (BMI) 33.0-33.9, adult: Secondary | ICD-10-CM

## 2020-09-02 DIAGNOSIS — F17200 Nicotine dependence, unspecified, uncomplicated: Secondary | ICD-10-CM | POA: Diagnosis present

## 2020-09-02 DIAGNOSIS — R918 Other nonspecific abnormal finding of lung field: Secondary | ICD-10-CM | POA: Diagnosis not present

## 2020-09-02 DIAGNOSIS — I5043 Acute on chronic combined systolic (congestive) and diastolic (congestive) heart failure: Secondary | ICD-10-CM | POA: Diagnosis present

## 2020-09-02 DIAGNOSIS — J811 Chronic pulmonary edema: Secondary | ICD-10-CM | POA: Diagnosis present

## 2020-09-02 DIAGNOSIS — Z955 Presence of coronary angioplasty implant and graft: Secondary | ICD-10-CM | POA: Diagnosis not present

## 2020-09-02 DIAGNOSIS — E876 Hypokalemia: Secondary | ICD-10-CM | POA: Diagnosis not present

## 2020-09-02 DIAGNOSIS — Z833 Family history of diabetes mellitus: Secondary | ICD-10-CM | POA: Diagnosis not present

## 2020-09-02 DIAGNOSIS — I5023 Acute on chronic systolic (congestive) heart failure: Secondary | ICD-10-CM | POA: Diagnosis not present

## 2020-09-02 DIAGNOSIS — Z9111 Patient's noncompliance with dietary regimen: Secondary | ICD-10-CM

## 2020-09-02 DIAGNOSIS — R0689 Other abnormalities of breathing: Secondary | ICD-10-CM | POA: Diagnosis not present

## 2020-09-02 DIAGNOSIS — R069 Unspecified abnormalities of breathing: Secondary | ICD-10-CM | POA: Diagnosis not present

## 2020-09-02 LAB — BASIC METABOLIC PANEL
Anion gap: 18 — ABNORMAL HIGH (ref 5–15)
BUN: 28 mg/dL — ABNORMAL HIGH (ref 6–20)
CO2: 24 mmol/L (ref 22–32)
Calcium: 9.4 mg/dL (ref 8.9–10.3)
Chloride: 100 mmol/L (ref 98–111)
Creatinine, Ser: 1.59 mg/dL — ABNORMAL HIGH (ref 0.61–1.24)
GFR, Estimated: 54 mL/min — ABNORMAL LOW (ref >60)
Glucose, Bld: 152 mg/dL — ABNORMAL HIGH (ref 70–99)
Potassium: 3.7 mmol/L (ref 3.5–5.1)
Sodium: 142 mmol/L (ref 135–145)

## 2020-09-02 LAB — CBC
HCT: 42.6 % (ref 39.0–52.0)
HCT: 39.3 % (ref 39.0–52.0)
Hemoglobin: 15.4 g/dL (ref 13.0–17.0)
Hemoglobin: 14.4 g/dL (ref 13.0–17.0)
MCH: 33 pg (ref 26.0–34.0)
MCH: 33.1 pg (ref 26.0–34.0)
MCHC: 36.2 g/dL — ABNORMAL HIGH (ref 30.0–36.0)
MCHC: 36.6 g/dL — ABNORMAL HIGH (ref 30.0–36.0)
MCV: 91.2 fL (ref 80.0–100.0)
MCV: 90.3 fL (ref 80.0–100.0)
Platelets: 423 10*3/uL — ABNORMAL HIGH (ref 150–400)
Platelets: 300 10*3/uL (ref 150–400)
RBC: 4.67 MIL/uL (ref 4.22–5.81)
RBC: 4.35 MIL/uL (ref 4.22–5.81)
RDW: 13.8 % (ref 11.5–15.5)
RDW: 13.6 % (ref 11.5–15.5)
WBC: 18.6 10*3/uL — ABNORMAL HIGH (ref 4.0–10.5)
WBC: 17.4 10*3/uL — ABNORMAL HIGH (ref 4.0–10.5)
nRBC: 0.2 % (ref 0.0–0.2)
nRBC: 0.1 % (ref 0.0–0.2)

## 2020-09-02 LAB — BRAIN NATRIURETIC PEPTIDE: B Natriuretic Peptide: 1859.9 pg/mL — ABNORMAL HIGH (ref 0.0–100.0)

## 2020-09-02 LAB — TROPONIN I (HIGH SENSITIVITY): Troponin I (High Sensitivity): 5 ng/L (ref <18)

## 2020-09-02 LAB — RESP PANEL BY RT-PCR (FLU A&B, COVID) ARPGX2
Influenza A by PCR: NEGATIVE
Influenza B by PCR: NEGATIVE
SARS Coronavirus 2 by RT PCR: NEGATIVE

## 2020-09-02 MED ORDER — FUROSEMIDE 10 MG/ML IJ SOLN: 40.0000 mg | Freq: Two times a day (BID) | INTRAMUSCULAR | Status: DC

## 2020-09-02 MED ORDER — ATORVASTATIN CALCIUM 80 MG PO TABS
80.0000 mg | ORAL_TABLET | Freq: Every day | ORAL | Status: DC
  Administered 2020-09-02 – 2020-09-03 (×2): 80 mg via ORAL
  Filled 2020-09-02 (×2): qty 4

## 2020-09-02 MED ORDER — NITROGLYCERIN 2 % TD OINT
1.0000 [in_us] | TOPICAL_OINTMENT | Freq: Once | TRANSDERMAL | Status: AC
  Administered 2020-09-02: 1 [in_us] via TOPICAL

## 2020-09-02 MED ORDER — SODIUM CHLORIDE 0.9% FLUSH
3.0000 mL | Freq: Two times a day (BID) | INTRAVENOUS | Status: DC
  Administered 2020-09-02 – 2020-09-03 (×2): 3 mL via INTRAVENOUS

## 2020-09-02 MED ORDER — FUROSEMIDE 10 MG/ML IJ SOLN
40.0000 mg | Freq: Two times a day (BID) | INTRAMUSCULAR | Status: DC
  Administered 2020-09-03 – 2020-09-04 (×3): 40 mg via INTRAVENOUS
  Filled 2020-09-02 (×3): qty 4

## 2020-09-02 MED ORDER — ONDANSETRON HCL 4 MG/2ML IJ SOLN: 4.0000 mg | Freq: Four times a day (QID) | INTRAMUSCULAR | Status: DC | PRN

## 2020-09-02 MED ORDER — SODIUM CHLORIDE 0.9% FLUSH: 3.0000 mL | INTRAVENOUS | Status: DC | PRN

## 2020-09-02 MED ORDER — ACETAMINOPHEN 325 MG PO TABS: 650.0000 mg | ORAL_TABLET | ORAL | Status: DC | PRN

## 2020-09-02 MED ORDER — SPIRONOLACTONE 25 MG PO TABS
12.5000 mg | ORAL_TABLET | Freq: Every day | ORAL | Status: DC
Start: 2020-09-02 — End: 2020-09-04
  Administered 2020-09-02 – 2020-09-04 (×3): 12.5 mg via ORAL
  Filled 2020-09-02: qty 1
  Filled 2020-09-02 (×3): qty 0.5

## 2020-09-02 MED ORDER — ENOXAPARIN SODIUM 60 MG/0.6ML ~~LOC~~ SOLN
0.5000 mg/kg | SUBCUTANEOUS | Status: DC
  Administered 2020-09-02: 47.5 mg via SUBCUTANEOUS
  Filled 2020-09-02 (×3): qty 0.6

## 2020-09-02 MED ORDER — FUROSEMIDE 10 MG/ML IJ SOLN
INTRAMUSCULAR | Status: AC
  Administered 2020-09-02: 60 mg via INTRAVENOUS
  Filled 2020-09-02: qty 8

## 2020-09-02 MED ORDER — ASPIRIN EC 81 MG PO TBEC
81.0000 mg | DELAYED_RELEASE_TABLET | Freq: Every day | ORAL | Status: DC
  Administered 2020-09-03 – 2020-09-04 (×2): 81 mg via ORAL
  Filled 2020-09-02 (×2): qty 1

## 2020-09-02 MED ORDER — FUROSEMIDE 10 MG/ML IJ SOLN: 60.0000 mg | Freq: Once | INTRAMUSCULAR | Status: AC

## 2020-09-02 MED ORDER — SODIUM CHLORIDE 0.9 % IV SOLN: 250.0000 mL | INTRAVENOUS | Status: DC | PRN

## 2020-09-02 MED ORDER — CARVEDILOL 25 MG PO TABS
25.0000 mg | ORAL_TABLET | Freq: Two times a day (BID) | ORAL | Status: DC
  Administered 2020-09-02 – 2020-09-04 (×4): 25 mg via ORAL
  Filled 2020-09-02: qty 1
  Filled 2020-09-02: qty 4
  Filled 2020-09-02 (×2): qty 1

## 2020-09-02 NOTE — ED Provider Notes (Signed)
[facility] Emergency Department Provider Note  ____________________________________________   I have reviewed the triage vital signs and the nursing notes.   HISTORY  Chief Complaint Shortness of Breath   History limited by and level 5 caveat due to: Respiratory distress   HPI Jeff Cobb is a 49 y.o. male who presents to the emergency department today because of concerns for shortness of breath.  The patient is in significant respiratory distress and cannot give a good history.  Answers primarily yes or no to questions.  It appears that the shortness of breath started suddenly this evening.  The patient denies any recent fevers.  No significant chest pain.  States he has been taking his medication. When EMS arrived the patient was found to be significantly hypoxic and was placed on CPAP.   Records reviewed. Per medical record review patient has a history of CAD, heart failure, HLD.   Past Medical History:  Diagnosis Date  . CAD S/P percutaneous coronary angioplasty 04/2015   a. 04/2015 Anterior STEMI/PCI @ [facility]: LM nl, mLAD 100% (3.0x15 Xience DES), LCx nl, OM1-3 nl, dRCA 50%, PDA nl.  . Chronic combined systolic and diastolic heart failure, NYHA class 2 ([place]) 04/2015   a. 12/2015 Echo: EF 30-35%; b. 09/2018 Echo: EF <20%, sev HK (was not taking meds for ~ 1 yr @ this time)  . Hyperlipidemia   . Hypertensive heart disease   . Ischemic cardiomyopathy    a. 04/2015 Echo: EF 25%, b. 12/2015 Echo: EF 30-35%; c. 09/2018 Echo: EF <20% (not taking meds).  . Tobacco abuse     Patient Active Problem List   Diagnosis Date Noted  . Hyperlipidemia with target LDL less than 70   . Ischemic cardiomyopathy   . Hypertensive heart disease   . Chronic combined systolic and diastolic heart failure, NYHA class 2 ([place]) 05/23/2015  . Compulsive tobacco user syndrome 05/06/2015  . ST elevation myocardial infarction (STEMI) ([place]) 05/03/2015  . CAD S/P DES PCI of mLAD in setting of  anterior STEMI 04/22/2015    Past Surgical History:  Procedure Laterality Date  . CORONARY ANGIOPLASTY WITH STENT PLACEMENT Left 04/2015   @ [facility]: mLAD 100% --> 3.0 x 15 Xience DES  . TRANSTHORACIC ECHOCARDIOGRAM  04/2015   @ [facility]: EF 25%, LVH, trivial MR, dilated thoracic aorta, anteroapical and apical akinesis and severely decreased contraction overall  . TRANSTHORACIC ECHOCARDIOGRAM  12/2015   Echo: EF 30-35%, mod conc LVH, mid-apicalanteroseptal, anterior, and apical AK, mildly dil LA    Prior to Admission medications   Medication Sig Start Date End Date Taking? Authorizing Provider  acetaminophen (TYLENOL) 325 MG tablet Take 650 mg by mouth every 6 (six) hours as needed.    [provider]  aspirin EC 81 MG tablet Take 81 mg by mouth daily.    [provider]  atorvastatin (LIPITOR) 80 MG tablet Take 1 tablet (80 mg total) by mouth daily. 08/21/20 08/16/21  Loel Dubonnet, NP  carvedilol (COREG) 25 MG tablet Take 1 tablet (25 mg total) by mouth 2 (two) times daily. 08/21/20   Loel Dubonnet, NP  cyanocobalamin 1000 MCG tablet Take 3,000 mcg by mouth daily.    [provider]  Omega-3 Fatty Acids (FISH OIL PO) Take by mouth daily.    [provider]  spironolactone (ALDACTONE) 25 MG tablet Take 0.5 tablets (12.5 mg total) by mouth daily. 08/22/20 11/20/20  Loel Dubonnet, NP    Allergies Patient has  no known allergies.  Family History  Problem Relation Age of Onset  . Heart disease Father   . Diabetes Mellitus II Father   . Heart attack Father     Social History Social History   Tobacco Use  . Smoking status: Current Some Day Smoker    Packs/day: 0.10  . Smokeless tobacco: Never Used  Vaping Use  . Vaping Use: Never used  Substance Use Topics  . Alcohol use: Yes    Alcohol/week: 2.0 standard drinks    Types: 2 Shots of liquor per week    Comment: daily  . Drug use: Yes    Types: Marijuana    Review of Systems Limited  secondary to respiratory distress. Constitutional: No fever/chills Cardiovascular: Denies chest pain. Respiratory: Positive for shortness of breath. Gastrointestinal: No abdominal pain.    ____________________________________________   PHYSICAL EXAM:  VITAL SIGNS: ED Triage Vitals  Enc Vitals Group     BP 09/02/20 1916 (!) 193/139     Pulse Rate 09/02/20 1916 (!) 136     Resp 09/02/20 1916 (!) 30     Temp 09/02/20 1928 97.7 F (36.5 C)     Temp Source 09/02/20 1928 Axillary     SpO2 09/02/20 1915 100 %     Weight 09/02/20 1917 211 lb 3.2 oz (95.8 kg)   Constitutional: Alert and oriented.  Eyes: Conjunctivae are normal.  ENT      Head: Normocephalic and atraumatic.      Nose: No congestion/rhinnorhea.      Mouth/Throat: Mucous membranes are moist.      Neck: No stridor. Hematological/Lymphatic/Immunilogical: No cervical lymphadenopathy. Cardiovascular: Tachycardia.  Respiratory: Increased respiratory effort. Diffuse rhonchi. Tachypnea.  Gastrointestinal: Soft and non tender. No rebound. No guarding.  Genitourinary: Deferred Musculoskeletal: Normal range of motion in all extremities. No lower extremity edema. Neurologic:  Normal speech and language. No gross focal neurologic deficits are appreciated.  Skin: Diaphoretic. Psychiatric: Mood and affect are normal.  ____________________________________________    LABS (pertinent positives/negatives)  COVID negative BMP na 142, k 3.7, glu 152, cr 1.59 BNP 1859.9 Trop hs 5 CBC 18.6, hgb 15.4, plt 423  ____________________________________________   EKG  I, Nance Pear, attending physician, personally viewed and interpreted this EKG  EKG Time: 1922 Rate: 129 Rhythm: sinus tachycardia Axis: left axis deviation Intervals: qtc 440 QRS: narrow ST changes: no st elevation Impression: abnormal ekg  ____________________________________________    RADIOLOGY  CXR Pulmonary  edema   ____________________________________________   PROCEDURES  Procedures   CRITICAL CARE Performed by: Nance Pear   Total critical care time: 35 minutes  Critical care time was exclusive of separately billable procedures and treating other patients.  Critical care was necessary to treat or prevent imminent or life-threatening deterioration.  Critical care was time spent personally by me on the following activities: development of treatment plan with patient and/or surrogate as well as nursing, discussions with consultants, evaluation of patient's response to treatment, examination of patient, obtaining history from patient or surrogate, ordering and performing treatments and interventions, ordering and review of laboratory studies, ordering and review of radiographic studies, pulse oximetry and re-evaluation of patient's condition.   ____________________________________________   INITIAL IMPRESSION / ASSESSMENT AND PLAN / ED COURSE  Pertinent labs & imaging results that were available during my care of the patient were reviewed by me and considered in my medical decision making (see chart for details).   Patient presented to the emergency department today with respiratory distress.  Patient states  that this started suddenly.  On exam patient was tachypneic tachycardic and diaphoretic.  He was switched onto our BiPAP from EMS and CPAP.  During this time and while he was briefly on room air he did desat into the 70s.  Blood pressure here in the emergency department was quite elevated.  I did have concerns for acute pulmonary edema.  Nitropaste and Lasix were ordered.  On reevaluation patient had improved significantly.  Will plan on admission to the hospitalist service.  ____________________________________________   FINAL CLINICAL IMPRESSION(S) / ED DIAGNOSES  Final diagnoses:  Acute pulmonary edema ([place])     Note: This dictation was prepared with Dragon dictation.  Any transcriptional errors that result from this process are unintentional     Nance Pear, MD 09/02/20 2224

## 2020-09-02 NOTE — ED Notes (Signed)
Pt family contact:   [phone]

## 2020-09-02 NOTE — ED Notes (Signed)
Pt work of breathing decreased. Pt transition of bipap to 4lpm to nasal cannula. Pt in agreeance to remove bipap.

## 2020-09-02 NOTE — ED Triage Notes (Signed)
Pt BIBA from home c/o of dyspnea that started at 1700. Pt has hx of CHF am takes 20mg  lasix/day. EMS reports pt o2 96% on cpap with PEEP 10 and declined en route. Upon arrival, pt o2 60% on RA. Currently o2 sat at 100% on bipap. EMS reports vitals PTA: BP 117/80, HR 125, CBG 133. Pt received 125 mg of solumedrol PTA. Pt has hx of heart attack.

## 2020-09-02 NOTE — H&P (Addendum)
History and Physical    Shelvy Perazzo TKP:[id number] DOB: 12/29/1970 DOA: 09/02/2020  PCP: Luciana Axe, NP   Patient coming from: Home  I have personally briefly reviewed patient's old medical records in [place]  Chief Complaint:  Sudden onset of shortness of breath.  HPI: Jeff Cobb is a 49 y.o. male with medical history significant of hypertension, ischemic cardiomyopathy with  EF of 30 to 35%, CAD s/p PCI, Hyperlipidemia, tobacco use, obesity presents in the emergency department with sudden onset of severe shortness of breath.  Patient reports while returning home from work,  he became severely short of breath,  after he reached home his wife called EMS and after EMS arrived his O2 saturation was 70% on room air, He was placed on CPAP and was brought in the ED.  He continued to remain hypoxic,  placed on BiPAP saturating 100%. Patient denies any chest pain, dizziness, palpitation, recent travel, sick contacts.  ED Course: He was very hypoxic on arrival.  His O2 saturation was 86% on CPAP, He was hypertensive, tachycardic and tachypneic. His O2 saturation improved after he was placed on BiPAP. HR 125, RR 24, BP 193/139. Labs include sodium 142, potassium 3.7, chloride 100, bicarb 24, glucose 152, BUN 28, creatinine 1.59, calcium 9.4, and anion gap 18, BNP 1859.9, troponin V, WBC 18.6, hemoglobin 15.4, hematocrit 42.6, MCV 91.2, platelet 423, influenza negative, Covid negative. CXR: Cardiomegaly with vascular congestion and hazy bilateral lung opacity probably due to pulmonary edema.   Review of Systems:  Review of Systems  Constitutional: Negative.   HENT: Negative.   Eyes: Negative.   Respiratory: Positive for shortness of breath.   Cardiovascular: Positive for orthopnea and leg swelling.  Gastrointestinal: Negative.   Genitourinary: Negative.   Musculoskeletal: Negative.   Skin: Negative.   Neurological: Negative.   Endo/Heme/Allergies: Negative.    Psychiatric/Behavioral: Negative.    Past Medical History:  Diagnosis Date  . CAD S/P percutaneous coronary angioplasty 04/2015   a. 04/2015 Anterior STEMI/PCI @ [facility]: LM nl, mLAD 100% (3.0x15 Xience DES), LCx nl, OM1-3 nl, dRCA 50%, PDA nl.  . Chronic combined systolic and diastolic heart failure, NYHA class 2 ([place]) 04/2015   a. 12/2015 Echo: EF 30-35%; b. 09/2018 Echo: EF <20%, sev HK (was not taking meds for ~ 1 yr @ this time)  . Hyperlipidemia   . Hypertensive heart disease   . Ischemic cardiomyopathy    a. 04/2015 Echo: EF 25%, b. 12/2015 Echo: EF 30-35%; c. 09/2018 Echo: EF <20% (not taking meds).  . Tobacco abuse     Past Surgical History:  Procedure Laterality Date  . CORONARY ANGIOPLASTY WITH STENT PLACEMENT Left 04/2015   @ [facility]: mLAD 100% --> 3.0 x 15 Xience DES  . TRANSTHORACIC ECHOCARDIOGRAM  04/2015   @ [facility]: EF 25%, LVH, trivial MR, dilated thoracic aorta, anteroapical and apical akinesis and severely decreased contraction overall  . TRANSTHORACIC ECHOCARDIOGRAM  12/2015   Echo: EF 30-35%, mod conc LVH, mid-apicalanteroseptal, anterior, and apical AK, mildly dil LA     reports that he has been smoking. He has been smoking about 0.10 packs per day. He has never used smokeless tobacco. He reports current alcohol use of about 2.0 standard drinks of alcohol per week. He reports current drug use. Drug: Marijuana.  No Known Allergies  Family History  Problem Relation Age of Onset  . Heart disease Father   . Diabetes Mellitus II Father   . Heart attack Father  Family history reviewed and not pertinent   Prior to Admission medications   Medication Sig Start Date End Date Taking? Authorizing Provider  acetaminophen (TYLENOL) 325 MG tablet Take 650 mg by mouth every 6 (six) hours as needed.    [provider]  aspirin EC 81 MG tablet Take 81 mg by mouth daily.    [provider]  atorvastatin (LIPITOR) 80 MG tablet Take 1 tablet (80 mg total) by mouth  daily. 08/21/20 08/16/21  Loel Dubonnet, NP  carvedilol (COREG) 25 MG tablet Take 1 tablet (25 mg total) by mouth 2 (two) times daily. 08/21/20   Loel Dubonnet, NP  cyanocobalamin 1000 MCG tablet Take 3,000 mcg by mouth daily.    [provider]  Omega-3 Fatty Acids (FISH OIL PO) Take by mouth daily.    [provider]  spironolactone (ALDACTONE) 25 MG tablet Take 0.5 tablets (12.5 mg total) by mouth daily. 08/22/20 11/20/20  Loel Dubonnet, NP    Physical Exam: Vitals:   09/02/20 1956 09/02/20 2000 09/02/20 2030 09/02/20 2045  BP:  (!) 155/110 (!) 133/97 (!) 146/91  Pulse: (!) 118 (!) 115 (!) 109 (!) 105  Resp: (!) 32 (!) 29 (!) 29 (!) 32  Temp:      TempSrc:      SpO2: 100% 100% 100% 100%  Weight:        Constitutional: NAD, calm, comfortable Vitals:   09/02/20 1956 09/02/20 2000 09/02/20 2030 09/02/20 2045  BP:  (!) 155/110 (!) 133/97 (!) 146/91  Pulse: (!) 118 (!) 115 (!) 109 (!) 105  Resp: (!) 32 (!) 29 (!) 29 (!) 32  Temp:      TempSrc:      SpO2: 100% 100% 100% 100%  Weight:       Eyes: PERRL, lids and conjunctivae normal ENMT: Mucous membranes are moist. Posterior pharynx clear of any exudate or lesions.Normal dentition.  Neck: normal, supple, no masses, no thyromegaly Respiratory: Bibasilar crackles on auscultation bilaterally, no wheezing,Increased respiratory effort. No accessory muscle use.  Cardiovascular: Regular rate and rhythm, no murmurs / rubs / gallops.  2+ pedal pulses. No carotid bruits.  Pitting edema 2+ Abdomen: no tenderness, no masses palpated. No hepatosplenomegaly. Bowel sounds positive.  Musculoskeletal: no clubbing / cyanosis. No joint deformity upper and lower extremities. Good ROM, no contractures. Normal muscle tone.  Skin: no rashes, lesions, ulcers. No induration Neurologic: CN 2-12 grossly intact. Sensation intact, DTR normal. Strength 5/5 in all 4.  Psychiatric: Normal judgment and insight. Alert and oriented x 3.  Normal mood.    Labs on Admission: I have personally reviewed following labs and imaging studies  CBC: Recent Labs  Lab 09/02/20 1921  WBC 18.6*  HGB 15.4  HCT 42.6  MCV 91.2  PLT 712*   Basic Metabolic Panel: Recent Labs  Lab 09/02/20 1921  NA 142  K 3.7  CL 100  CO2 24  GLUCOSE 152*  BUN 28*  CREATININE 1.59*  CALCIUM 9.4   GFR: Estimated Creatinine Clearance: 63.4 mL/min (A) (by C-G formula based on SCr of 1.59 mg/dL (H)). Liver Function Tests: No results for input(s): AST, ALT, ALKPHOS, BILITOT, PROT, ALBUMIN in the last 168 hours. No results for input(s): LIPASE, AMYLASE in the last 168 hours. No results for input(s): AMMONIA in the last 168 hours. Coagulation Profile: No results for input(s): INR, PROTIME in the last 168 hours. Cardiac Enzymes: No results for input(s): CKTOTAL, CKMB, CKMBINDEX, TROPONINI in the last 168  hours. BNP (last 3 results) No results for input(s): PROBNP in the last 8760 hours. HbA1C: No results for input(s): HGBA1C in the last 72 hours. CBG: No results for input(s): GLUCAP in the last 168 hours. Lipid Profile: No results for input(s): CHOL, HDL, LDLCALC, TRIG, CHOLHDL, LDLDIRECT in the last 72 hours. Thyroid Function Tests: No results for input(s): TSH, T4TOTAL, FREET4, T3FREE, THYROIDAB in the last 72 hours. Anemia Panel: No results for input(s): VITAMINB12, FOLATE, FERRITIN, TIBC, IRON, RETICCTPCT in the last 72 hours. Urine analysis: No results found for: COLORURINE, APPEARANCEUR, LABSPEC, [place], GLUCOSEU, [place], BILIRUBINUR, KETONESUR, PROTEINUR, UROBILINOGEN, NITRITE, LEUKOCYTESUR  Radiological Exams on Admission: DG Chest Portable 1 View  Result Date: 09/02/2020 CLINICAL DATA:  Short of breath EXAM: PORTABLE CHEST 1 VIEW COMPARISON:  12/24/2015 FINDINGS: Cardiomegaly with vascular congestion and hazy bilateral lung opacity, probably due to pulmonary edema. Possible small effusions. No pneumothorax. IMPRESSION:  Cardiomegaly with vascular congestion and hazy bilateral lung opacity probably due to pulmonary edema. Electronically Signed   By: Donavan Foil M.D.   On: 09/02/2020 19:34    EKG: Independently reviewed. Sinus tachycardia , LAE, consider biatrial enlargement Left ventricular hypertrophy,  Inferior infarct, old  Assessment/Plan Principal Problem:   Pulmonary edema Active Problems:   Hyperlipidemia with target LDL less than 70   Ischemic cardiomyopathy   Hypertensive heart disease   CAD S/P DES PCI of mLAD in setting of anterior STEMI   Compulsive tobacco user syndrome   Chronic combined systolic and diastolic heart failure, NYHA class 2 ([facility])    Acute hypoxic respiratory failure requiring BiPAP secondary to flash pulmonary edema: Patient does have hx. of ischemic cardiomyopathy with EF of 30 to 35%. Patient developed sudden onset of severe shortness of breath. He was saturating 70% on room air,  placed on CPAP and brought in the ED,  continued on BiPAP. He is still tachypneic but slowly improving. BNP 1859, troponin 5, chest x-ray consistent with pulmonary edema. Admit to progressive unit. Pt. received Lasix 60 mg IV once,  we will continue 40 mg IV twice daily. We will continue BiPAP for now and try to wean down to nasal cannula. Daily weight, intake output charting. Obtain 2D echocardiogram. His ACE inhibitor was recently discontinued by his cardiologist for worsening renal functions. Consult cardiology in the morning. Patient will probably require EP evaluation for ICD placement given low EF.  CAD:  Continue aspirin, Lipitor, Coreg. Patient denies any chest pain, troponin 5.0 EKG: Sinus tachycardia,  left atrial enlargement.  CKD stage IIIb: Baseline creatinine remains between 1.5-1.7. Serum creatinine at her baseline.  Leukocytosis: Chest x-ray shows no infiltrate, could be reactive. Obtain procalcitonin, and consider antibiotics if elevated.  Hypertension: Patient  was hypertensive on arrival, givenNitropaste. We will resume home blood pressure medications. Hydralazine as needed.  Tobacco use: Patient was counseled on quit smoking.  DVT prophylaxis: Lovenox Code Status:Full code Family Communication: Spoke with wife at bedside. Disposition Plan: Home Consults called: Cardiology ( sent secure test to Dr. Clayborn Bigness) Admission status: Inpatient   Shawna Clamp MD [facility]   If 7PM-7AM, please contact night-coverage [email or web address]   09/02/2020, 9:03 PM

## 2020-09-03 ENCOUNTER — Inpatient Hospital Stay (HOSPITAL_COMMUNITY)
Admit: 2020-09-03 | Discharge: 2020-09-03 | Disposition: A | Discharge status: Home / Self Care | Payer: BC Managed Care – PPO | Attending: Family Medicine | Admitting: Family Medicine

## 2020-09-03 DIAGNOSIS — J810 Acute pulmonary edema: Secondary | ICD-10-CM

## 2020-09-03 DIAGNOSIS — N1832 Chronic kidney disease, stage 3b: Secondary | ICD-10-CM

## 2020-09-03 DIAGNOSIS — Z720 Tobacco use: Secondary | ICD-10-CM

## 2020-09-03 DIAGNOSIS — R0602 Shortness of breath: Secondary | ICD-10-CM

## 2020-09-03 DIAGNOSIS — I5023 Acute on chronic systolic (congestive) heart failure: Secondary | ICD-10-CM

## 2020-09-03 LAB — BASIC METABOLIC PANEL
Anion gap: 15 (ref 5–15)
BUN: 32 mg/dL — ABNORMAL HIGH (ref 6–20)
CO2: 24 mmol/L (ref 22–32)
Calcium: 9.1 mg/dL (ref 8.9–10.3)
Chloride: 97 mmol/L — ABNORMAL LOW (ref 98–111)
Creatinine, Ser: 1.52 mg/dL — ABNORMAL HIGH (ref 0.61–1.24)
GFR, Estimated: 57 mL/min — ABNORMAL LOW (ref >60)
Glucose, Bld: 133 mg/dL — ABNORMAL HIGH (ref 70–99)
Potassium: 3.5 mmol/L (ref 3.5–5.1)
Sodium: 136 mmol/L (ref 135–145)

## 2020-09-03 LAB — TROPONIN I (HIGH SENSITIVITY): Troponin I (High Sensitivity): 3 ng/L (ref <18)

## 2020-09-03 LAB — URINALYSIS, COMPLETE (UACMP) WITH MICROSCOPIC
Bacteria, UA: NONE SEEN
Bilirubin Urine: NEGATIVE
Glucose, UA: NEGATIVE mg/dL
Hgb urine dipstick: NEGATIVE
Ketones, ur: NEGATIVE mg/dL
Leukocytes,Ua: NEGATIVE
Nitrite: NEGATIVE
Protein, ur: NEGATIVE mg/dL
Specific Gravity, Urine: 1.009 (ref 1.005–1.030)
Squamous Epithelial / HPF: NONE SEEN (ref 0–5)
pH: 6 (ref 5.0–8.0)

## 2020-09-03 LAB — CREATININE, SERUM
Creatinine, Ser: 1.45 mg/dL — ABNORMAL HIGH (ref 0.61–1.24)
GFR, Estimated: 60 mL/min — ABNORMAL LOW (ref >60)

## 2020-09-03 LAB — ECHOCARDIOGRAM COMPLETE
AR max vel: 1.92 cm2
AV Area VTI: 1.73 cm2
AV Area mean vel: 1.65 cm2
AV Mean grad: 2.5 mmHg
AV Peak grad: 4.1 mmHg
Ao pk vel: 1.02 m/s
Area-P 1/2: 7.22 cm2
Calc EF: 15.6 %
S' Lateral: 6.09 cm
Single Plane A2C EF: 18.5 %
Single Plane A4C EF: 10.9 %
Weight: 3379.21 oz

## 2020-09-03 LAB — PROCALCITONIN: Procalcitonin: 0.53 ng/mL

## 2020-09-03 LAB — HIV ANTIBODY (ROUTINE TESTING W REFLEX): HIV Screen 4th Generation wRfx: NONREACTIVE

## 2020-09-03 MED ORDER — PERFLUTREN LIPID MICROSPHERE
1.0000 mL | INTRAVENOUS | Status: AC | PRN
  Administered 2020-09-03: 2 mL via INTRAVENOUS
  Filled 2020-09-03: qty 10

## 2020-09-03 MED ORDER — NICOTINE 14 MG/24HR TD PT24
14.0000 mg | MEDICATED_PATCH | Freq: Every day | TRANSDERMAL | Status: DC
  Administered 2020-09-03 – 2020-09-04 (×2): 14 mg via TRANSDERMAL
  Filled 2020-09-03 (×2): qty 1

## 2020-09-03 NOTE — ED Notes (Signed)
Pt weaned down to 2lpm via [place]. Pt currently satting at 98%.

## 2020-09-03 NOTE — ED Notes (Signed)
Rounding on patient. Provided ice water, denies pain, resting comfortably with visitor at bedside. No other request.

## 2020-09-03 NOTE — ED Notes (Signed)
Weaned patient down and removed oxygen. Satting well. Patient up to toilet for BM.

## 2020-09-03 NOTE — ED Notes (Signed)
Patient resting comfortably. Denies pain. Bedside table placed at patients bed.

## 2020-09-03 NOTE — ED Notes (Signed)
Provider notified pt has been transitioned off bipap to 4lpm via [place]. Provider is okay with keeping pt off of bipap and monitoring pt. Pt is on cardiac, bp and pulse ox monitor. Pt work of breathing has improved since when he was brought into the ED

## 2020-09-03 NOTE — ED Notes (Signed)
Pt noted to be disconnected from oxygen satting at 88%. RN reconnected oxygen at 3lpm. Pt now satting between 95-100%.

## 2020-09-03 NOTE — Progress Notes (Signed)
PROGRESS NOTE    Jeff Cobb  NFA:[id number] DOB: Dec 08, 1970 DOA: 09/02/2020 PCP: Luciana Axe, NP  Outpatient Specialists: [facility]    Brief Narrative:  Jeff Cobb is a 49 y.o. male with medical history significant of hypertension, ischemic cardiomyopathy with  EF of 30 to 35%, CAD s/p PCI, Hyperlipidemia, tobacco use, obesity presents in the emergency department with sudden onset of severe shortness of breath.  Patient reports while returning home from work,  he became severely short of breath,  after he reached home his wife called EMS and after EMS arrived his O2 saturation was 70% on room air, He was placed on CPAP and was brought in the ED.  He continued to remain hypoxic,  placed on BiPAP saturating 100%. Patient denies any chest pain, dizziness, palpitation, recent travel, sick contacts.  Assessment & Plan:   Principal Problem:   Pulmonary edema Active Problems:   Hyperlipidemia with target LDL less than 70   Ischemic cardiomyopathy   Hypertensive heart disease   CAD S/P DES PCI of mLAD in setting of anterior STEMI   Compulsive tobacco user syndrome   Chronic combined systolic and diastolic heart failure, NYHA class 2 (HCC)  # HFrEF with acute exacerbation # Acute hypoxic respiratory failure # CAD with ischemic cardiomyopathy Presented with abrupt onset severe dyspnea, hypoxic to 70s, consistent w/ flash pulmonary edema. No clear trigger, denies changes to meds or fluids or significant dietary indescretions. Required bipap on presentation, now weaned to 2 L resting comfortably. bnp 1900. troponins neg and flat, no ischemic symptoms. CXR w/ pulm edema. EF less than 20% on 2020 TTE, pt has history of irregular cardiology f/u and poor compliance w/ meds. On spiro at home, no other diuretics. Hx ischemic cardiomyopathy s/p stent 2016 - cont lasix 40 iv bid - cont spiro, coreg - cont o2, wean as able - TTE orderd - cont aspirin, statin - cardiology consulted, will defer  institution of further evidence based meds to them, also consideration of ICD  # CKD stage IIIb: Baseline creatinine remains between 1.5-1.7. Serum creatinine at her baseline. - monitor  # Leukocytosis: Likely reactive. No symptoms of infection, CXR w/o clear infiltrate. No uti symptoms, blood cultures ordered - monitor  # Hypertension: Currently normotensive - cont home spiro, coreg  # Tobacco use: - nicotine patch  DVT prophylaxis: lovenox Code Status: full Family Communication: none @ bedside  Status is: Inpatient  Remains inpatient appropriate because:Inpatient level of care appropriate due to severity of illness   Dispo: The patient is from: Home              Anticipated d/c is to: Home              Anticipated d/c date is: 0-2 days              Patient currently is not medically stable to d/c.        Consultants:  cardiology  Procedures: none  Antimicrobials:  none    Subjective: This morning breathing much better, feeling less sob. No chest pain, has appetite, no abd pain, no n/v/d, no dysuria, no fevers.  Objective: Vitals:   09/03/20 0700 09/03/20 0730 09/03/20 0735 09/03/20 0745  BP: (!) 130/98 117/90  (!) 140/120  Pulse: 75 76  79  Resp: (!) 22 18  (!) 23  Temp:      TempSrc:      SpO2: 96% 96% 95% 93%  Weight:  Intake/Output Summary (Last 24 hours) at 09/03/2020 0805 Last data filed at 09/03/2020 5638 Gross per 24 hour  Intake --  Output 1500 ml  Net -1500 ml   Filed Weights   09/02/20 1917  Weight: 95.8 kg    Examination:  General exam: Appears calm and comfortable  Respiratory system: faint exp wheeze Cardiovascular system: S1 & S2 heard, RRR. Soft systolic murmur  Gastrointestinal system: Abdomen is nondistended, soft and nontender. No organomegaly or masses felt. Normal bowel sounds heard. Central nervous system: Alert and oriented. No focal neurological deficits. Extremities: Symmetric 5 x 5 power. Skin: No  rashes, lesions or ulcers Psychiatry: Judgement and insight appear normal. Mood & affect appropriate.     Data Reviewed: I have personally reviewed following labs and imaging studies  CBC: Recent Labs  Lab 09/02/20 1921 09/02/20 2327  WBC 18.6* 17.4*  HGB 15.4 14.4  HCT 42.6 39.3  MCV 91.2 90.3  PLT 423* 756   Basic Metabolic Panel: Recent Labs  Lab 09/02/20 1921 09/02/20 2327 09/03/20 0203  NA 142  --  136  K 3.7  --  3.5  CL 100  --  97*  CO2 24  --  24  GLUCOSE 152*  --  133*  BUN 28*  --  32*  CREATININE 1.59* 1.45* 1.52*  CALCIUM 9.4  --  9.1   GFR: Estimated Creatinine Clearance: 66.3 mL/min (A) (by C-G formula based on SCr of 1.52 mg/dL (H)). Liver Function Tests: No results for input(s): AST, ALT, ALKPHOS, BILITOT, PROT, ALBUMIN in the last 168 hours. No results for input(s): LIPASE, AMYLASE in the last 168 hours. No results for input(s): AMMONIA in the last 168 hours. Coagulation Profile: No results for input(s): INR, PROTIME in the last 168 hours. Cardiac Enzymes: No results for input(s): CKTOTAL, CKMB, CKMBINDEX, TROPONINI in the last 168 hours. BNP (last 3 results) No results for input(s): PROBNP in the last 8760 hours. HbA1C: No results for input(s): HGBA1C in the last 72 hours. CBG: No results for input(s): GLUCAP in the last 168 hours. Lipid Profile: No results for input(s): CHOL, HDL, LDLCALC, TRIG, CHOLHDL, LDLDIRECT in the last 72 hours. Thyroid Function Tests: No results for input(s): TSH, T4TOTAL, FREET4, T3FREE, THYROIDAB in the last 72 hours. Anemia Panel: No results for input(s): VITAMINB12, FOLATE, FERRITIN, TIBC, IRON, RETICCTPCT in the last 72 hours. Urine analysis: No results found for: COLORURINE, APPEARANCEUR, LABSPEC, PHURINE, GLUCOSEU, HGBUR, BILIRUBINUR, KETONESUR, PROTEINUR, UROBILINOGEN, NITRITE, LEUKOCYTESUR Sepsis Labs: @LABRCNTIP (procalcitonin:4,lacticidven:4)  ) Recent Results (from the past 240 hour(s))  Resp Panel  by RT-PCR (Flu A&B, Covid) Nasopharyngeal Swab     Status: None   Collection Time: 09/02/20  7:21 PM   Specimen: Nasopharyngeal Swab; Nasopharyngeal(NP) swabs in vial transport medium  Result Value Ref Range Status   SARS Coronavirus 2 by RT PCR NEGATIVE NEGATIVE Final    Comment: (NOTE) SARS-CoV-2 target nucleic acids are NOT DETECTED.  The SARS-CoV-2 RNA is generally detectable in upper respiratory specimens during the acute phase of infection. The lowest concentration of SARS-CoV-2 viral copies this assay can detect is 138 copies/mL. A negative result does not preclude SARS-Cov-2 infection and should not be used as the sole basis for treatment or other patient management decisions. A negative result may occur with  improper specimen collection/handling, submission of specimen other than nasopharyngeal swab, presence of viral mutation(s) within the areas targeted by this assay, and inadequate number of viral copies(<138 copies/mL). A negative result must be combined with clinical observations,  patient history, and epidemiological information. The expected result is Negative.  Fact Sheet for Patients:  EntrepreneurPulse.com.au  Fact Sheet for Healthcare Providers:  IncredibleEmployment.be  This test is no t yet approved or cleared by the [place] FDA and  has been authorized for detection and/or diagnosis of SARS-CoV-2 by FDA under an Emergency Use Authorization (EUA). This EUA will remain  in effect (meaning this test can be used) for the duration of the COVID-19 declaration under Section 564(b)(1) of the Act, 21 U.S.C.section 360bbb-3(b)(1), unless the authorization is terminated  or revoked sooner.       Influenza A by PCR NEGATIVE NEGATIVE Final   Influenza B by PCR NEGATIVE NEGATIVE Final    Comment: (NOTE) The Xpert Xpress SARS-CoV-2/FLU/RSV plus assay is intended as an aid in the diagnosis of influenza from Nasopharyngeal swab  specimens and should not be used as a sole basis for treatment. Nasal washings and aspirates are unacceptable for Xpert Xpress SARS-CoV-2/FLU/RSV testing.  Fact Sheet for Patients: EntrepreneurPulse.com.au  Fact Sheet for Healthcare Providers: IncredibleEmployment.be  This test is not yet approved or cleared by the [place] FDA and has been authorized for detection and/or diagnosis of SARS-CoV-2 by FDA under an Emergency Use Authorization (EUA). This EUA will remain in effect (meaning this test can be used) for the duration of the COVID-19 declaration under Section 564(b)(1) of the Act, 21 U.S.C. section 360bbb-3(b)(1), unless the authorization is terminated or revoked.  Performed at [facility], [address] [place], [place] [address]   CULTURE, BLOOD (ROUTINE X 2) w Reflex to ID Panel     Status: None (Preliminary result)   Collection Time: 09/03/20  2:03 AM   Specimen: BLOOD  Result Value Ref Range Status   Specimen Description BLOOD BLOOD RIGHT FOREARM  Final   Special Requests   Final    BOTTLES DRAWN AEROBIC AND ANAEROBIC Blood Culture adequate volume   Culture   Final    NO GROWTH < 12 HOURS Performed at [facility], [address] [place], [place] [address]    Report Status PENDING  Incomplete  CULTURE, BLOOD (ROUTINE X 2) w Reflex to ID Panel     Status: None (Preliminary result)   Collection Time: 09/03/20  2:03 AM   Specimen: BLOOD  Result Value Ref Range Status   Specimen Description BLOOD RIGHT HAND  Final   Special Requests   Final    BOTTLES DRAWN AEROBIC AND ANAEROBIC Blood Culture results may not be optimal due to an excessive volume of blood received in culture bottles   Culture   Final    NO GROWTH < 12 HOURS Performed at [facility], [address], [place], [place] [address]    Report Status PENDING  Incomplete         Radiology Studies: DG Chest Portable 1  View  Result Date: 09/02/2020 CLINICAL DATA:  Short of breath EXAM: PORTABLE CHEST 1 VIEW COMPARISON:  12/24/2015 FINDINGS: Cardiomegaly with vascular congestion and hazy bilateral lung opacity, probably due to pulmonary edema. Possible small effusions. No pneumothorax. IMPRESSION: Cardiomegaly with vascular congestion and hazy bilateral lung opacity probably due to pulmonary edema. Electronically Signed   By: Donavan Foil M.D.   On: 09/02/2020 19:34        Scheduled Meds: . aspirin EC  81 mg Oral Daily  . atorvastatin  80 mg Oral Daily  . carvedilol  25 mg Oral BID  . enoxaparin (LOVENOX) injection  0.5 mg/kg Subcutaneous  Q24H  . furosemide  40 mg Intravenous BID  . sodium chloride flush  3 mL Intravenous Q12H  . spironolactone  12.5 mg Oral Daily   Continuous Infusions: . sodium chloride       LOS: 1 day    Time spent: 40 min    Desma Maxim, MD [facility]   If 7PM-7AM, please contact night-coverage [email or web address] Password [email or web address] 09/03/2020, 8:05 AM

## 2020-09-03 NOTE — Consult Note (Signed)
Cardiology Consultation:   Patient ID: Jeff [facility]; [id number]; 04-30-1971   Admit date: 09/02/2020 Date of Consult: 09/03/2020  Primary Care Provider: Luciana Axe, NP Primary Cardiologist: End Primary Electrophysiologist:  None   Patient Profile:   Jeff Cobb is a 49 y.o. male with a hx of CAD with anterior STEMI in 04/2015 status post PCI/DES to the LAD at [facility], HFrEF secondary to ICM with an EF of less than 20%, medical nonadherence, hypertensive heart disease, and HLD who is being seen today for the evaluation of volume overload at the request of Dr. Dwyane Cobb.  History of Present Illness:   [facility] was admitted to [facility] with an anterior ST EMI in 04/2015 and underwent successful PCI/DES to the LAD.  He was subsequently noted to have HFrEF secondary to ICM with an EF of 25% at that time.  Subsequent echo in 2017 showed slight improvement in his LV systolic function with an EF of 30 to 35%.  He was seen in 11/2016 with plans to optimize GDMT though follow-up blood work showed renal insufficiency leading to the holding of spironolactone and Entresto.  He was subsequently lost to follow-up.  Echo in 09/2018 showed an EF of less than 20%.  He was seen in the office in 11/2018 and noted to have been off all his medications for a year prior to his echo noted in 09/2018.  When he was seen in 11/2018 GDMT was resumed.  More recently, he was seen in the office in 04/2020 with reported intermittent compliance with medications over the preceding year.  We contacted his pharmacy is listed in epic and were told he had not filled medications at [facility] in 2 years and that [facility].  He was most recently seen in the office on 08/21/2020 and reported compliance with medications though upon contacting his pharmacy he last picked up a 90-day supply of Cobb in 01/2020 and his remaining pill bottles were empty with dates of being filled in 2018 and 2019.  He was asymptomatic at that time.  He was  restarted on carvedilol.  Follow-up labs showed improved renal function from prior and he was advised to take spironolactone and not resume Cobb.  He was in his usual state of health until leaving work on 12/13 when upon getting to his car he developed sudden onset of shortness of breath which persisted for his ride home.  Upon arriving home his wife noted the patient's dyspnea and contacted EMS where his saturations were noted to be 70% on room air.  He was placed on CPAP and brought to the ED where he remained hypoxic with a saturation of 86% leading him to be placed on BiPAP.  Blood pressure was significantly elevated at 193/139.  He was tachycardic and tachypneic.  Chest x-ray showed cardiomegaly with vascular congestion and hazy bilateral lung opacities due to probable pulmonary edema.  High-sensitivity troponin negative x2.  BNP 1859.  BUN/SCR 28/1.59 trending to 32/1.52, potassium 3.7 trending to 3.5, WBC 18.6 trending to 17.4, Hgb 15.4.  EKG showed sinus tachycardia, 129 bpm, LVH, inferior Q waves, nonspecific ST-T changes.  In the ED, Nitropaste was applied and he received 60 mg of Cobb.  Upon admission, he was placed on IV Cobb 40 mg twice daily.  He has subsequently been transitioned from BiPAP to supplemental oxygen via nasal cannula.  Documented urine output of 1.5 L in the ED.  Blood pressure has improved though remains elevated at  140/120.  He reports significant improvement in his dyspnea.  He denies any chest pain, lower extremity swelling, or abdominal distention.  He is uncertain of any particular triggers leading up to the events yesterday.  He does state he continued to take Cobb following his visit with our office in early 08/2020 and not the recommended spironolactone.  He does drink approximately 3-4 large containers of water daily that appeared to be close to 1 L in size by his description.    Past Medical History:  Diagnosis Date  . CAD S/P percutaneous coronary angioplasty  04/2015   a. 04/2015 Anterior STEMI/PCI @ [facility]: LM nl, mLAD 100% (3.0x15 Xience DES), LCx nl, OM1-3 nl, dRCA 50%, PDA nl.  . Chronic combined systolic and diastolic heart failure, NYHA class 2 ([facility]) 04/2015   a. 12/2015 Echo: EF 30-35%; b. 09/2018 Echo: EF <20%, sev HK (was not taking meds for ~ 1 yr @ this time)  . Hyperlipidemia   . Hypertensive heart disease   . Ischemic cardiomyopathy    a. 04/2015 Echo: EF 25%, b. 12/2015 Echo: EF 30-35%; c. 09/2018 Echo: EF <20% (not taking meds).  . Tobacco abuse     Past Surgical History:  Procedure Laterality Date  . CORONARY ANGIOPLASTY WITH STENT PLACEMENT Left 04/2015   @ [facility]: mLAD 100% --> 3.0 x 15 Xience DES  . TRANSTHORACIC ECHOCARDIOGRAM  04/2015   @ [facility]: EF 25%, LVH, trivial MR, dilated thoracic aorta, anteroapical and apical akinesis and severely decreased contraction overall  . TRANSTHORACIC ECHOCARDIOGRAM  12/2015   Echo: EF 30-35%, mod conc LVH, mid-apicalanteroseptal, anterior, and apical AK, mildly dil LA     Home Meds: Prior to Admission medications   Medication Sig Start Date End Date Taking? Authorizing Provider  acetaminophen (TYLENOL) 325 MG tablet Take 650 mg by mouth every 6 (six) hours as needed for mild pain, fever or headache.   Yes [provider]  aspirin EC 81 MG tablet Take 81 mg by mouth daily.   Yes [provider]  atorvastatin (LIPITOR) 80 MG tablet Take 1 tablet (80 mg total) by mouth daily. 08/21/20 08/16/21 Yes Loel Dubonnet, NP  carvedilol (COREG) 25 MG tablet Take 1 tablet (25 mg total) by mouth 2 (two) times daily. 08/21/20  Yes Loel Dubonnet, NP  cyanocobalamin 1000 MCG tablet Take 3,000 mcg by mouth daily.   Yes [provider]  Omega-3 Fatty Acids (FISH OIL PO) Take by mouth daily.   Yes [provider]  spironolactone (ALDACTONE) 25 MG tablet Take 0.5 tablets (12.5 mg total) by mouth daily. 08/22/20 11/20/20 Yes Loel Dubonnet, NP    Inpatient  Medications: Scheduled Meds: . aspirin EC  81 mg Oral Daily  . atorvastatin  80 mg Oral Daily  . carvedilol  25 mg Oral BID  . enoxaparin (LOVENOX) injection  0.5 mg/kg Subcutaneous Q24H  . furosemide  40 mg Intravenous BID  . nicotine  14 mg Transdermal Daily  . sodium chloride flush  3 mL Intravenous Q12H  . spironolactone  12.5 mg Oral Daily   Continuous Infusions: . sodium chloride     PRN Meds: sodium chloride, acetaminophen, ondansetron (ZOFRAN) IV, sodium chloride flush  Allergies:  No Known Allergies  Social History:   Social History   Socioeconomic History  . Marital status: Married    Spouse name: Not on file  . Number of children: Not on file  . Years of education: Not on file  . Highest  education level: Not on file  Occupational History  . Occupation: unemployed  Tobacco Use  . Smoking status: Current Some Day Smoker    Packs/day: 0.10  . Smokeless tobacco: Never Used  Vaping Use  . Vaping Use: Never used  Substance and Sexual Activity  . Alcohol use: Yes    Alcohol/week: 2.0 standard drinks    Types: 2 Shots of liquor per week    Comment: daily  . Drug use: Yes    Types: Marijuana  . Sexual activity: Not on file  Other Topics Concern  . Not on file  Social History Narrative  . Not on file   Social Determinants of Health   Financial Resource Strain: Not on file  Food Insecurity: Not on file  Transportation Needs: Not on file  Physical Activity: Not on file  Stress: Not on file  Social Connections: Not on file  Intimate Partner Violence: Not on file     Family History:   Family History  Problem Relation Age of Onset  . Heart disease Father   . Diabetes Mellitus II Father   . Heart attack Father     ROS:  Review of Systems  Constitutional: Negative for chills, diaphoresis, fever, malaise/fatigue and weight loss.  HENT: Negative for congestion.   Eyes: Negative for discharge and redness.  Respiratory: Positive for shortness of breath.  Negative for cough, sputum production and wheezing.   Cardiovascular: Positive for orthopnea. Negative for chest pain, palpitations, claudication, leg swelling and PND.  Gastrointestinal: Negative for abdominal pain, heartburn, nausea and vomiting.  Musculoskeletal: Negative for falls and myalgias.  Skin: Negative for rash.  Neurological: Negative for dizziness, tingling, tremors, sensory change, speech change, focal weakness, loss of consciousness and weakness.  Endo/Heme/Allergies: Does not bruise/bleed easily.  Psychiatric/Behavioral: Negative for substance abuse. The patient is not nervous/anxious.   All other systems reviewed and are negative.     Physical Exam/Data:   Vitals:   09/03/20 1000 09/03/20 1015 09/03/20 1030 09/03/20 1045  BP: (!) 132/98 140/85 110/66 121/85  Pulse: 80 76 81 79  Resp: (!) 24 (!) 24 (!) 25 (!) 23  Temp:      TempSrc:      SpO2: 99% 99% 96% 97%  Weight:        Intake/Output Summary (Last 24 hours) at 09/03/2020 1052 Last data filed at 09/03/2020 0027 Gross per 24 hour  Intake --  Output 1500 ml  Net -1500 ml   Filed Weights   09/02/20 1917  Weight: 95.8 kg   Body mass index is 33.08 kg/m.   Physical Exam: General: Well developed, well nourished, in no acute distress. Head: Normocephalic, atraumatic, sclera non-icteric, no xanthomas, nares without discharge.  Neck: Negative for carotid bruits. JVD elevated ~ 10 cm. Lungs: Bibasilar crackles. Breathing is unlabored. Heart: RRR with S1 S2. No murmurs, rubs, or gallops appreciated. Abdomen: Soft, non-tender, non-distended with normoactive bowel sounds. No hepatomegaly. No rebound/guarding. No obvious abdominal masses. Msk:  Strength and tone appear normal for age. Extremities: No clubbing or cyanosis. No edema. Distal pedal pulses are 2+ and equal bilaterally. Neuro: Alert and oriented X 3. No facial asymmetry. No focal deficit. Moves all extremities spontaneously. Psych:  Responds to  questions appropriately with a normal affect.   EKG:  The EKG was personally reviewed and demonstrates: sinus tachycardia, 129 bpm, LVH, inferior Q waves, nonspecific ST-T changes Telemetry:  Telemetry was personally reviewed and demonstrates: SR  Weights: [employer]   09/02/20 1917  Weight: 95.8 kg    Relevant CV Studies:  2D echo 09/2018: - Left ventricle: The cavity size was severely dilated. Systolic  function was severely reduced. The estimated ejection fraction  was less than 20% Severe global hypokinesis. Wall motion of basal  to mid inferior wall best preserved. The study is not technically  sufficient to allow evaluation of LV diastolic function.  - Mitral valve: There was mild regurgitation.  - Right ventricle: Systolic function was normal.  - Pulmonary arteries: Systolic pressure could not be accurately  estimated.  __________  2D echo 06/2016: - Left ventricle: The cavity size was mildly dilated. Systolic  function was severely reduced. The estimated ejection fraction  was in the range of 25% to 30%. Severe anterior, anteroseptal and  apical wall hypokinesis. Doppler parameters are consistent with  abnormal left ventricular relaxation (grade 1 diastolic  dysfunction).  - Left atrium: The atrium was normal in size.  - Right ventricle: Systolic function was normal.  - Pulmonary arteries: Systolic pressure was within the normal  range.   Impressions:   - Unable to exclude mural thrombus in the apical region. __________  2D echo 12/2015: - Left ventricle: The cavity size was moderately dilated. There was  moderate concentric hypertrophy. Systolic function was moderately  to severely reduced. The estimated ejection fraction was in the  range of 30% to 35%. Akinesis of the mid-apicalanteroseptal,  anterior, and apical myocardium. Left ventricular diastolic  function parameters were normal.  - Left atrium: The atrium was mildly  dilated.  Laboratory Data:  Chemistry Recent Labs  Lab 09/02/20 1921 09/02/20 2327 09/03/20 0203  NA 142  --  136  K 3.7  --  3.5  CL 100  --  97*  CO2 24  --  24  GLUCOSE 152*  --  133*  BUN 28*  --  32*  CREATININE 1.59* 1.45* 1.52*  CALCIUM 9.4  --  9.1  GFRNONAA 54* 60* 57*  ANIONGAP 18*  --  15    No results for input(s): PROT, ALBUMIN, AST, ALT, ALKPHOS, BILITOT in the last 168 hours. Hematology Recent Labs  Lab 09/02/20 1921 09/02/20 2327  WBC 18.6* 17.4*  RBC 4.67 4.35  HGB 15.4 14.4  HCT 42.6 39.3  MCV 91.2 90.3  MCH 33.0 33.1  MCHC 36.2* 36.6*  RDW 13.8 13.6  PLT 423* 300   Cardiac EnzymesNo results for input(s): TROPONINI in the last 168 hours. No results for input(s): TROPIPOC in the last 168 hours.  BNP Recent Labs  Lab 09/02/20 1921  BNP 1,859.9*    DDimer No results for input(s): DDIMER in the last 168 hours.  Radiology/Studies:  DG Chest Portable 1 View  Result Date: 09/02/2020 IMPRESSION: Cardiomegaly with vascular congestion and hazy bilateral lung opacity probably due to pulmonary edema. Electronically Signed   By: Donavan Foil M.D.   On: 09/02/2020 19:34    Assessment and Plan:   1.  Acute on chronic HFrEF secondary to ICM with pulmonary edema secondary to hypertensive heart disease: -Symptoms are significantly improved though he does remain volume overloaded -Continue IV Cobb 40 mg twice daily along with spironolactone and carvedilol -Escalate GDMT as able moving forward with addition of losartan moving forward which consideration can be undertaken for possible transition to [facility] in the outpatient setting pending compliance and labs -Prior to consideration of potential ICD for the patient's underlying ischemic cardiomyopathy he will need to demonstrate medical adherence with medications and routine recommended follow-up.  He  will likely need to be on maximally tolerated GDMT for several more months followed by a repeat limited echo  in the outpatient setting to assist in this decision -Replete potassium as indicated -Close monitoring of renal function -Wean supplemental oxygen as able -Echo pending -Likely exacerbated by medical and dietary noncompliance with hypertensive heart disease -Importance of medical adherence was discussed in detail -Daily weights -Strict I's and O's  2.  CAD involving the native coronary arteries: -Never with chest pain -High-sensitivity troponin negative x2 -Echo pending as above -ASA -Lipitor -No plans for inpatient ischemic evaluation at this time  3. CKD stage IIIb: -Stable -Monitor with diuresis   4. Tobacco/alcohol use: -Cessation advised    For questions or updates, please contact [place] Please consult [email or web address] for contact info under Cardiology/STEMI.   Signed, Christell Faith, PA-C [place] Pager: [phone] 09/03/2020, 10:52 AM

## 2020-09-03 NOTE — ED Notes (Signed)
Pt is not on BiPap, resp even and unlabored

## 2020-09-03 NOTE — Progress Notes (Signed)
*  PRELIMINARY RESULTS* Echocardiogram 2D Echocardiogram has been performed.  [facility] 09/03/2020, 2:31 PM

## 2020-09-03 NOTE — Progress Notes (Signed)
*  PRELIMINARY RESULTS* Echocardiogram 2D Echocardiogram has been performed.  Sherrie Sport 09/03/2020, 2:32 PM

## 2020-09-04 ENCOUNTER — Encounter: Payer: Self-pay | Admitting: Family Medicine

## 2020-09-04 ENCOUNTER — Telehealth: Payer: Self-pay | Admitting: *Deleted

## 2020-09-04 DIAGNOSIS — I5042 Chronic combined systolic (congestive) and diastolic (congestive) heart failure: Secondary | ICD-10-CM

## 2020-09-04 DIAGNOSIS — Z9861 Coronary angioplasty status: Secondary | ICD-10-CM

## 2020-09-04 DIAGNOSIS — I2510 Atherosclerotic heart disease of native coronary artery without angina pectoris: Secondary | ICD-10-CM

## 2020-09-04 DIAGNOSIS — I255 Ischemic cardiomyopathy: Secondary | ICD-10-CM

## 2020-09-04 LAB — CBC
HCT: 39.8 % (ref 39.0–52.0)
Hemoglobin: 14.5 g/dL (ref 13.0–17.0)
MCH: 32.6 pg (ref 26.0–34.0)
MCHC: 36.4 g/dL — ABNORMAL HIGH (ref 30.0–36.0)
MCV: 89.4 fL (ref 80.0–100.0)
Platelets: 318 10*3/uL (ref 150–400)
RBC: 4.45 MIL/uL (ref 4.22–5.81)
RDW: 13.3 % (ref 11.5–15.5)
WBC: 11.1 10*3/uL — ABNORMAL HIGH (ref 4.0–10.5)
nRBC: 0 % (ref 0.0–0.2)

## 2020-09-04 LAB — BASIC METABOLIC PANEL
Anion gap: 12 (ref 5–15)
BUN: 41 mg/dL — ABNORMAL HIGH (ref 6–20)
CO2: 28 mmol/L (ref 22–32)
Calcium: 9.3 mg/dL (ref 8.9–10.3)
Chloride: 96 mmol/L — ABNORMAL LOW (ref 98–111)
Creatinine, Ser: 1.3 mg/dL — ABNORMAL HIGH (ref 0.61–1.24)
GFR, Estimated: >60 mL/min (ref >60)
Glucose, Bld: 120 mg/dL — ABNORMAL HIGH (ref 70–99)
Potassium: 3.2 mmol/L — ABNORMAL LOW (ref 3.5–5.1)
Sodium: 136 mmol/L (ref 135–145)

## 2020-09-04 MED ORDER — FUROSEMIDE 40 MG PO TABS: 40.0000 mg | ORAL_TABLET | Freq: Every day | ORAL | 2 refills | Status: DC

## 2020-09-04 MED ORDER — LOSARTAN POTASSIUM 25 MG PO TABS
12.5000 mg | ORAL_TABLET | Freq: Every day | ORAL | Status: DC
  Administered 2020-09-04: 12.5 mg via ORAL
  Filled 2020-09-04: qty 1

## 2020-09-04 MED ORDER — LOSARTAN POTASSIUM 25 MG PO TABS: 12.5000 mg | ORAL_TABLET | Freq: Every day | ORAL | 2 refills | Status: DC

## 2020-09-04 MED ORDER — POTASSIUM CHLORIDE CRYS ER 20 MEQ PO TBCR
20.0000 meq | EXTENDED_RELEASE_TABLET | Freq: Once | ORAL | Status: AC
  Administered 2020-09-04: 20 meq via ORAL
  Filled 2020-09-04: qty 1

## 2020-09-04 MED ORDER — FUROSEMIDE 40 MG PO TABS: 40.0000 mg | ORAL_TABLET | Freq: Every day | ORAL | Status: DC

## 2020-09-04 NOTE — Telephone Encounter (Signed)
-----   Message from Rise Mu, [place] sent at 09/04/2020 10:03 AM EST ----- Regarding: follow up Please schedule this patient for hospital follow up with Dr. Saunders Revel or an APP around 12/23. He has an echo scheduled that day that can be cancelled as he had this while admitted. He will need a BMET on 09/09/2020. Thanks!

## 2020-09-04 NOTE — TOC Initial Note (Addendum)
Transition of Care [facility]) - Initial/Assessment Note    Patient Details  Name: Jeff [facility] MRN: [id number] Date of Birth: 1971-09-19  Transition [facility] CM/SW Contact:    Ova Freshwater Phone Number: [phone] 09/04/2020, 8:53 AM  Clinical Narrative:                  Patient presents to [facility] ED due to shortness of breath, patient stated last time he felt this way was 20218. Patient stated he is able to perform all ADLs without any issues or assistance.  Patient's cardiologist is Dr. Harrell Gave End, and the patient is able to drive himself to doctor's appointments. Patient is able to obtain medications and take medications with out issues. Patient currently works as a Horticulturist, commercial, Probation officer. Patient stated he prefers home health instead of SNF placement.  CSW gave him information on Medicare.gov website.   Expected Discharge Plan: Skilled Nursing Facility Barriers to Discharge: Continued Medical Work up   Patient Goals and CMS Choice Patient states their goals for this hospitalization and ongoing recovery are:: "Get better soon."      Expected Discharge Plan and Services Expected Discharge Plan: [place] Acute Care Choice: [place] arrangements for the past 2 months: Single Family Home                                      Prior Living Arrangements/Services Living arrangements for the past 2 months: Single Family Home Lives with:: Spouse Patient language and need for interpreter reviewed:: Yes        Need for Family Participation in Patient Care: Yes (Comment) Care giver support system in place?: Yes (comment)   Criminal Activity/Legal Involvement Pertinent to Current Situation/Hospitalization: No - Comment as needed  Activities of Daily Living Home Assistive Devices/Equipment: None ADL Screening (condition at time of admission) Patient's cognitive ability adequate to safely complete daily activities?:  Yes Is the patient deaf or have difficulty hearing?: No Does the patient have difficulty seeing, even when wearing glasses/contacts?: No Does the patient have difficulty concentrating, remembering, or making decisions?: No Patient able to express need for assistance with ADLs?: Yes Does the patient have difficulty dressing or bathing?: No Independently performs ADLs?: Yes (appropriate for developmental age) Does the patient have difficulty walking or climbing stairs?: No Weakness of Legs: None Weakness of Arms/Hands: None  Permission Sought/Granted Permission sought to share information with : Facility Sport and exercise psychologist Permission granted to share information with : Yes, Verbal Permission Granted  Share Information with NAME: Casteneda,Catherine (Spouse)   [phone]           Emotional Assessment Appearance:: Appears stated age Attitude/Demeanor/Rapport: Engaged Affect (typically observed): Stable Orientation: : Oriented to Self,Oriented to Place,Oriented to  Time,Oriented to Situation,Fluctuating Orientation (Suspected and/or reported Sundowners) Alcohol / Substance Use: Not Applicable Psych Involvement: No (comment)  Admission diagnosis:  Acute pulmonary edema ([place]) [J81.0] Pulmonary edema [J81.1] Patient Active Problem List   Diagnosis Date Noted  . Pulmonary edema 09/02/2020  . Hyperlipidemia with target LDL less than 70   . Ischemic cardiomyopathy   . Hypertensive heart disease   . Chronic combined systolic and diastolic heart failure, NYHA class 2 ([place]) 05/23/2015  . Compulsive tobacco user syndrome 05/06/2015  . ST elevation myocardial infarction (STEMI) ([place]) 05/03/2015  . CAD S/P DES PCI of mLAD in setting of anterior STEMI  04/22/2015   PCP:  Luciana Axe, NP Pharmacy:   [facility] [phone], [place] [address] Phone: [phone] Fax:  [phone]     Social Determinants of Health (SDOH) Interventions    Readmission Risk Interventions No flowsheet data found.

## 2020-09-04 NOTE — Discharge Summary (Signed)
Physician Discharge Summary  Jeff Cobb IRJ:[id number] DOB: 07-15-1971 DOA: 09/02/2020  PCP: Jeff Axe, NP  Admit date: 09/02/2020 Discharge date: 09/04/2020  Admitted From: home Disposition:  home  Recommendations for Outpatient Follow-up:  1. Follow up with PCP in 1-2 weeks 2. Please obtain BMP and CBC in 5 days 3. Please follow up with [facility], Dr. Saunders Revel, next week as scheduled  Home Health: No  Equipment/Devices: None   Discharge Condition: Stable  CODE STATUS: Full  Diet recommendation: Heart Healthy     Discharge Diagnoses: Principal Problem:   Pulmonary edema Active Problems:   Hyperlipidemia with target LDL less than 70   Ischemic cardiomyopathy   Hypertensive heart disease   CAD S/P DES PCI of mLAD in setting of anterior STEMI   Compulsive tobacco user syndrome   Chronic combined systolic and diastolic heart failure, NYHA class 2 (HCC)    Summary of HPI and Hospital Course:   Jeff Cobb a 49 y.o.malewith medical history significant ofhypertension, ischemic cardiomyopathy with EF of 30 to 35%,CAD s/p PCI, Hyperlipidemia, tobacco use, obesity presents in the emergency department with sudden onset of severe shortness of breath. Patient reports while returning home from work,he became severely short of breath,after he reached home his wife called EMS andafter EMS arrived his O2 saturation was 70% on room air, He was placed on CPAP and was brought in the ED. He continued to remain hypoxic,placed on BiPAP saturating 100%. Patient denies any chest pain, dizziness, palpitation, recent travel, sick contacts.   HFrEF with acute decompensation CAD with ischemic cardiomyopathy Acute Respiratory Failure with hypoxia Presented with abrupt onset severe dyspnea, hypoxic to 70s, consistent w/ flash pulmonary edema. No clear trigger, denies changes to meds or fluids or significant dietary indescretions. Required bipap on presentation, since weaned off  oxygen. Cardiology was consulted. Patient was diuresed with IV Lasix. Continued on spironolactone, Coreg, ASA, statin. Started on daily oral Lasix as well as losartan. Counseled extensively on importance of medication compliance. Cardiology considering transition to Jeff Cobb as outpatient  Repeat BMP in 5 days at follow up  CKD stage IIIb - baseline Cr around 1.5-1.7 and is stable.    Leukocytosis - likely reactive, no evidence or sx's of infection.  Cultures obtained negative to date.  Hypertension - meds as above  Tobacco use - counseled on importance of cessation.  Nicotine patches ordered during admission.     Discharge Instructions   Discharge Instructions    (HEART FAILURE PATIENTS) Call MD:  Anytime you have any of the following symptoms: 1) 3 pound weight gain in 24 hours or 5 pounds in 1 week 2) shortness of breath, with or without a dry hacking cough 3) swelling in the hands, feet or stomach 4) if you have to sleep on extra pillows at night in order to breathe.   Complete by: As directed    Call MD for:  extreme fatigue   Complete by: As directed    Call MD for:  persistant dizziness or light-headedness   Complete by: As directed    Call MD for:  persistant nausea and vomiting   Complete by: As directed    Call MD for:  severe uncontrolled pain   Complete by: As directed    Call MD for:  temperature >100.4   Complete by: As directed    Diet - low sodium heart healthy   Complete by: As directed    Increase activity slowly   Complete by: As directed  Allergies as of 09/04/2020   No Known Allergies     Medication List    TAKE these medications   acetaminophen 325 MG tablet Commonly known as: TYLENOL Take 650 mg by mouth every 6 (six) hours as needed for mild pain, fever or headache.   aspirin EC 81 MG tablet Take 81 mg by mouth daily.   atorvastatin 80 MG tablet Commonly known as: LIPITOR Take 1 tablet (80 mg total) by mouth daily.   carvedilol 25  MG tablet Commonly known as: COREG Take 1 tablet (25 mg total) by mouth 2 (two) times daily.   cyanocobalamin 1000 MCG tablet Take 3,000 mcg by mouth daily.   FISH OIL PO Take by mouth daily.   furosemide 40 MG tablet Commonly known as: LASIX Take 1 tablet (40 mg total) by mouth daily. Start taking on: September 05, 2020   losartan 25 MG tablet Commonly known as: COZAAR Take 0.5 tablets (12.5 mg total) by mouth daily. Start taking on: September 05, 2020   spironolactone 25 MG tablet Commonly known as: ALDACTONE Take 0.5 tablets (12.5 mg total) by mouth daily.       No Known Allergies  Consultations:  Cardiology    Procedures/Studies: DG Chest Portable 1 View  Result Date: 09/02/2020 CLINICAL DATA:  Short of breath EXAM: PORTABLE CHEST 1 VIEW COMPARISON:  12/24/2015 FINDINGS: Cardiomegaly with vascular congestion and hazy bilateral lung opacity, probably due to pulmonary edema. Possible small effusions. No pneumothorax. IMPRESSION: Cardiomegaly with vascular congestion and hazy bilateral lung opacity probably due to pulmonary edema. Electronically Signed   By: Donavan Foil M.D.   On: 09/02/2020 19:34   ECHOCARDIOGRAM COMPLETE  Result Date: 09/03/2020    ECHOCARDIOGRAM REPORT   Patient Name:   Jeff Cobb Date of Exam: 09/03/2020 Medical Rec #:  [id number]  Height:       67.0 in Accession #:    [id number] Weight:       211.2 lb Date of Birth:  May 03, 1971  BSA:          2.070 m Patient Age:    80 years   BP:           130/98 mmHg Patient Gender: M          HR:           75 bpm. Exam Location:  [facility] Procedure: 2D Echo, Cardiac Doppler, Color Doppler and Intracardiac            Opacification Agent Indications:     Dyspnea 786.09  History:         Patient has prior history of Echocardiogram examinations, most                  recent 10/14/2018. Risk Factors:Hypertension.  Sonographer:     Sherrie Sport RDCS (AE) Referring Phys:  [id number] PARDEEP ASTMH Diagnosing Phys: Nelva Bush MD  IMPRESSIONS  1. Left ventricular ejection fraction, by estimation, is <20%. The left ventricle has severely decreased function. The left ventricle demonstrates global hypokinesis. The left ventricular internal cavity size was severely dilated. There is mild left ventricular hypertrophy. Left ventricular diastolic parameters are consistent with Grade II diastolic dysfunction (pseudonormalization). Elevated left atrial pressure.  2. Right ventricular systolic function is mildly reduced. The right ventricular size is mildly enlarged. Tricuspid regurgitation signal is inadequate for assessing PA pressure.  3. Left atrial size was mild to moderately dilated.  4. Right atrial size was moderately dilated.  5. The mitral valve is  grossly normal. Mild mitral valve regurgitation. No evidence of mitral stenosis.  6. The aortic valve is tricuspid. Aortic valve regurgitation is not visualized. No aortic stenosis is present.  7. Moderately dilated pulmonary artery. FINDINGS  Left Ventricle: Left ventricular ejection fraction, by estimation, is <20%. The left ventricle has severely decreased function. The left ventricle demonstrates global hypokinesis. Definity contrast agent was given IV to delineate the left ventricular endocardial borders. The left ventricular internal cavity size was severely dilated. There is mild left ventricular hypertrophy. Left ventricular diastolic parameters are consistent with Grade II diastolic dysfunction (pseudonormalization). Elevated left  atrial pressure. Right Ventricle: The right ventricular size is mildly enlarged. No increase in right ventricular wall thickness. Right ventricular systolic function is mildly reduced. Tricuspid regurgitation signal is inadequate for assessing PA pressure. Left Atrium: Left atrial size was mild to moderately dilated. Right Atrium: Right atrial size was moderately dilated. Pericardium: There is no evidence of pericardial effusion. Mitral Valve: The mitral valve  is grossly normal. Mild mitral valve regurgitation. No evidence of mitral valve stenosis. Tricuspid Valve: The tricuspid valve is grossly normal. Tricuspid valve regurgitation is trivial. Aortic Valve: The aortic valve is tricuspid. Aortic valve regurgitation is not visualized. No aortic stenosis is present. Aortic valve mean gradient measures 2.5 mmHg. Aortic valve peak gradient measures 4.1 mmHg. Aortic valve area, by VTI measures 1.73 cm. Pulmonic Valve: The pulmonic valve was normal in structure. Pulmonic valve regurgitation is trivial. No evidence of pulmonic stenosis. Aorta: The aortic root is normal in size and structure. Pulmonary Artery: The pulmonary artery is moderately dilated. Venous: The inferior vena cava was not well visualized. IAS/Shunts: The interatrial septum was not well visualized.  LEFT VENTRICLE PLAX 2D LVIDd:         6.39 cm      Diastology LVIDs:         6.09 cm      LV e' medial:    2.50 cm/s LV PW:         1.21 cm      LV E/e' medial:  33.6 LV IVS:        1.17 cm      LV e' lateral:   11.20 cm/s LVOT diam:     2.30 cm      LV E/e' lateral: 7.5 LV SV:         27 LV SV Index:   13 LVOT Area:     4.15 cm  LV Volumes (MOD) LV vol d, MOD A2C: 259.0 ml LV vol d, MOD A4C: 202.0 ml LV vol s, MOD A2C: 211.0 ml LV vol s, MOD A4C: 180.0 ml LV SV MOD A2C:     48.0 ml LV SV MOD A4C:     202.0 ml LV SV MOD BP:      36.6 ml RIGHT VENTRICLE RV Basal diam:  5.75 cm RV S prime:     13.90 cm/s LEFT ATRIUM           Index       RIGHT ATRIUM           Index LA diam:      4.70 cm 2.27 cm/m  RA Area:     26.60 cm LA Vol (A2C): 80.2 ml 38.75 ml/m RA Volume:   82.80 ml  40.01 ml/m LA Vol (A4C): 95.4 ml 46.10 ml/m  AORTIC VALVE                   PULMONIC VALVE  AV Area (Vmax):    1.92 cm    PV Vmax:        0.54 m/s AV Area (Vmean):   1.65 cm    PV Peak grad:   1.2 mmHg AV Area (VTI):     1.73 cm    RVOT Peak grad: 3 mmHg AV Vmax:           101.80 cm/s AV Vmean:          71.950 cm/s AV VTI:             0.156 m AV Peak Grad:      4.1 mmHg AV Mean Grad:      2.5 mmHg LVOT Vmax:         47.00 cm/s LVOT Vmean:        28.500 cm/s LVOT VTI:          0.065 m LVOT/AV VTI ratio: 0.42  AORTA Ao Root diam: 3.40 cm MITRAL VALVE MV Area (PHT): 7.22 cm    SHUNTS MV Decel Time: 105 msec    Systemic VTI:  0.07 m MV E velocity: 84.00 cm/s  Systemic Diam: 2.30 cm MV A velocity: 56.10 cm/s MV E/A ratio:  1.50 Harrell Gave End MD Electronically signed by Nelva Bush MD Signature Date/Time: 09/03/2020/6:01:47 PM    Final        Subjective: Pt seen this AM and reports feeling well. No CP or SOB.  No other acute complaints.   Discharge Exam: Vitals:   09/04/20 1055 09/04/20 1124  BP: 114/88 103/80  Pulse: 75 75  Resp: 18 18  Temp: 98.2 F (36.8 C) 98.4 F (36.9 C)  SpO2:  95%   Vitals:   09/04/20 0600 09/04/20 0825 09/04/20 1055 09/04/20 1124  BP: (!) 125/97 114/88 114/88 103/80  Pulse: 77 75 75 75  Resp: (!) 23 18 18 18   Temp:  98.2 F (36.8 C) 98.2 F (36.8 C) 98.4 F (36.9 C)  TempSrc:  Oral Oral Oral  SpO2: 93% 99%  95%  Weight:   95.8 kg   Height:   5\' 7"  (1.702 m)     General: Pt is alert, awake, not in acute distress Cardiovascular: RRR, S1/S2 +, no rubs, no gallops Respiratory: CTA bilaterally, no wheezing, no rhonchi Abdominal: Soft, NT, ND, bowel sounds + Extremities: no edema, no cyanosis    The results of significant diagnostics from this hospitalization (including imaging, microbiology, ancillary and laboratory) are listed below for reference.     Microbiology: Recent Results (from the past 240 hour(s))  Resp Panel by RT-PCR (Flu A&B, Covid) Nasopharyngeal Swab     Status: None   Collection Time: 09/02/20  7:21 PM   Specimen: Nasopharyngeal Swab; Nasopharyngeal(NP) swabs in vial transport medium  Result Value Ref Range Status   SARS Coronavirus 2 by RT PCR NEGATIVE NEGATIVE Final    Comment: (NOTE) SARS-CoV-2 target nucleic acids are NOT DETECTED.  The SARS-CoV-2  RNA is generally detectable in upper respiratory specimens during the acute phase of infection. The lowest concentration of SARS-CoV-2 viral copies this assay can detect is 138 copies/mL. A negative result does not preclude SARS-Cov-2 infection and should not be used as the sole basis for treatment or other patient management decisions. A negative result may occur with  improper specimen collection/handling, submission of specimen other than nasopharyngeal swab, presence of viral mutation(s) within the areas targeted by this assay, and inadequate number of viral copies(<138 copies/mL). A negative result must be combined with  clinical observations, patient history, and epidemiological information. The expected result is Negative.  Fact Sheet for Patients:  EntrepreneurPulse.com.au  Fact Sheet for Healthcare Providers:  IncredibleEmployment.be  This test is no t yet approved or cleared by the [place] FDA and  has been authorized for detection and/or diagnosis of SARS-CoV-2 by FDA under an Emergency Use Authorization (EUA). This EUA will remain  in effect (meaning this test can be used) for the duration of the COVID-19 declaration under Section 564(b)(1) of the Act, 21 U.S.C.section 360bbb-3(b)(1), unless the authorization is terminated  or revoked sooner.       Influenza A by PCR NEGATIVE NEGATIVE Final   Influenza B by PCR NEGATIVE NEGATIVE Final    Comment: (NOTE) The Xpert Xpress SARS-CoV-2/FLU/RSV plus assay is intended as an aid in the diagnosis of influenza from Nasopharyngeal swab specimens and should not be used as a sole basis for treatment. Nasal washings and aspirates are unacceptable for Xpert Xpress SARS-CoV-2/FLU/RSV testing.  Fact Sheet for Patients: EntrepreneurPulse.com.au  Fact Sheet for Healthcare Providers: IncredibleEmployment.be  This test is not yet approved or cleared by the  [place] FDA and has been authorized for detection and/or diagnosis of SARS-CoV-2 by FDA under an Emergency Use Authorization (EUA). This EUA will remain in effect (meaning this test can be used) for the duration of the COVID-19 declaration under Section 564(b)(1) of the Act, 21 U.S.C. section 360bbb-3(b)(1), unless the authorization is terminated or revoked.  Performed at [facility], [address] [place], [place] [address]   CULTURE, BLOOD (ROUTINE X 2) w Reflex to ID Panel     Status: None (Preliminary result)   Collection Time: 09/03/20  2:03 AM   Specimen: BLOOD  Result Value Ref Range Status   Specimen Description BLOOD BLOOD RIGHT FOREARM  Final   Special Requests   Final    BOTTLES DRAWN AEROBIC AND ANAEROBIC Blood Culture adequate volume   Culture   Final    NO GROWTH 1 DAY Performed at [facility], [address], [place], [place] [address]    Report Status PENDING  Incomplete  CULTURE, BLOOD (ROUTINE X 2) w Reflex to ID Panel     Status: None (Preliminary result)   Collection Time: 09/03/20  2:03 AM   Specimen: BLOOD  Result Value Ref Range Status   Specimen Description BLOOD RIGHT HAND  Final   Special Requests   Final    BOTTLES DRAWN AEROBIC AND ANAEROBIC Blood Culture results may not be optimal due to an excessive volume of blood received in culture bottles   Culture   Final    NO GROWTH 1 DAY Performed at [facility], [address], [place],  [address]    Report Status PENDING  Incomplete     Labs: BNP (last 3 results) Recent Labs    09/02/20 1921  BNP 8,341.9*   Basic Metabolic Panel: Recent Labs  Lab 09/02/20 1921 09/02/20 2327 09/03/20 0203 09/04/20 0821  NA 142  --  136 136  K 3.7  --  3.5 3.2*  CL 100  --  97* 96*  CO2 24  --  24 28  GLUCOSE 152*  --  133* 120*  BUN 28*  --  32* 41*  CREATININE 1.59* 1.45* 1.52* 1.30*  CALCIUM 9.4  --  9.1 9.3   Liver Function Tests: No results for  input(s): AST, ALT, ALKPHOS, BILITOT, PROT, ALBUMIN in the last 168 hours. No results for input(s): LIPASE, AMYLASE  in the last 168 hours. No results for input(s): AMMONIA in the last 168 hours. CBC: Recent Labs  Lab 09/02/20 1921 09/02/20 2327 09/04/20 0821  WBC 18.6* 17.4* 11.1*  HGB 15.4 14.4 14.5  HCT 42.6 39.3 39.8  MCV 91.2 90.3 89.4  PLT 423* 300 318   Cardiac Enzymes: No results for input(s): CKTOTAL, CKMB, CKMBINDEX, TROPONINI in the last 168 hours. BNP: Invalid input(s): POCBNP CBG: No results for input(s): GLUCAP in the last 168 hours. D-Dimer No results for input(s): DDIMER in the last 72 hours. Hgb A1c No results for input(s): HGBA1C in the last 72 hours. Lipid Profile No results for input(s): CHOL, HDL, LDLCALC, TRIG, CHOLHDL, LDLDIRECT in the last 72 hours. Thyroid function studies No results for input(s): TSH, T4TOTAL, T3FREE, THYROIDAB in the last 72 hours.  Invalid input(s): FREET3 Anemia work up No results for input(s): VITAMINB12, FOLATE, FERRITIN, TIBC, IRON, RETICCTPCT in the last 72 hours. Urinalysis    Component Value Date/Time   COLORURINE YELLOW (A) 09/03/2020 0203   APPEARANCEUR CLEAR (A) 09/03/2020 0203   LABSPEC 1.009 09/03/2020 0203   PHURINE 6.0 09/03/2020 0203   GLUCOSEU NEGATIVE 09/03/2020 0203   HGBUR NEGATIVE 09/03/2020 0203   BILIRUBINUR NEGATIVE 09/03/2020 0203   KETONESUR NEGATIVE 09/03/2020 0203   PROTEINUR NEGATIVE 09/03/2020 0203   NITRITE NEGATIVE 09/03/2020 0203   LEUKOCYTESUR NEGATIVE 09/03/2020 0203   Sepsis Labs Invalid input(s): PROCALCITONIN,  WBC,  LACTICIDVEN Microbiology Recent Results (from the past 240 hour(s))  Resp Panel by RT-PCR (Flu A&B, Covid) Nasopharyngeal Swab     Status: None   Collection Time: 09/02/20  7:21 PM   Specimen: Nasopharyngeal Swab; Nasopharyngeal(NP) swabs in vial transport medium  Result Value Ref Range Status   SARS Coronavirus 2 by RT PCR NEGATIVE NEGATIVE Final    Comment:  (NOTE) SARS-CoV-2 target nucleic acids are NOT DETECTED.  The SARS-CoV-2 RNA is generally detectable in upper respiratory specimens during the acute phase of infection. The lowest concentration of SARS-CoV-2 viral copies this assay can detect is 138 copies/mL. A negative result does not preclude SARS-Cov-2 infection and should not be used as the sole basis for treatment or other patient management decisions. A negative result may occur with  improper specimen collection/handling, submission of specimen other than nasopharyngeal swab, presence of viral mutation(s) within the areas targeted by this assay, and inadequate number of viral copies(<138 copies/mL). A negative result must be combined with clinical observations, patient history, and epidemiological information. The expected result is Negative.  Fact Sheet for Patients:  EntrepreneurPulse.com.au  Fact Sheet for Healthcare Providers:  IncredibleEmployment.be  This test is no t yet approved or cleared by the [place] FDA and  has been authorized for detection and/or diagnosis of SARS-CoV-2 by FDA under an Emergency Use Authorization (EUA). This EUA will remain  in effect (meaning this test can be used) for the duration of the COVID-19 declaration under Section 564(b)(1) of the Act, 21 U.S.C.section 360bbb-3(b)(1), unless the authorization is terminated  or revoked sooner.       Influenza A by PCR NEGATIVE NEGATIVE Final   Influenza B by PCR NEGATIVE NEGATIVE Final    Comment: (NOTE) The Xpert Xpress SARS-CoV-2/FLU/RSV plus assay is intended as an aid in the diagnosis of influenza from Nasopharyngeal swab specimens and should not be used as a sole basis for treatment. Nasal washings and aspirates are unacceptable for Xpert Xpress SARS-CoV-2/FLU/RSV testing.  Fact Sheet for Patients: EntrepreneurPulse.com.au  Fact Sheet for Healthcare  Providers: IncredibleEmployment.be  This test is not yet approved or cleared by the [place] and has been authorized for detection and/or diagnosis of SARS-CoV-2 by FDA under an Emergency Use Authorization (EUA). This EUA will remain in effect (meaning this test can be used) for the duration of the COVID-19 declaration under Section 564(b)(1) of the Act, 21 U.S.C. section 360bbb-3(b)(1), unless the authorization is terminated or revoked.  Performed at [facility], [address] [place], [place] [address]   CULTURE, BLOOD (ROUTINE X 2) w Reflex to ID Panel     Status: None (Preliminary result)   Collection Time: 09/03/20  2:03 AM   Specimen: BLOOD  Result Value Ref Range Status   Specimen Description BLOOD BLOOD RIGHT FOREARM  Final   Special Requests   Final    BOTTLES DRAWN AEROBIC AND ANAEROBIC Blood Culture adequate volume   Culture   Final    NO GROWTH 1 DAY Performed at [facility], [address], [place], [place] [address]    Report Status PENDING  Incomplete  CULTURE, BLOOD (ROUTINE X 2) w Reflex to ID Panel     Status: None (Preliminary result)   Collection Time: 09/03/20  2:03 AM   Specimen: BLOOD  Result Value Ref Range Status   Specimen Description BLOOD RIGHT HAND  Final   Special Requests   Final    BOTTLES DRAWN AEROBIC AND ANAEROBIC Blood Culture results may not be optimal due to an excessive volume of blood received in culture bottles   Culture   Final    NO GROWTH 1 DAY Performed at [facility], [address] [place], [place] [address]    Report Status PENDING  Incomplete     Time coordinating discharge: Over 30 minutes  SIGNED:   Ezekiel Slocumb, DO [facility] 09/04/2020, 12:17 PM   If 7PM-7AM, please contact night-coverage [email or web address]

## 2020-09-04 NOTE — Telephone Encounter (Signed)
Patient contacted regarding discharge from [facility] on 09/04/20.  Patient understands to follow up with provider Laurann Montana NP on 09/12/20 at 08:00 AM at [facility]. Patient understands discharge instructions? Yes Patient understands medications and regiment? Yes Patient understands to bring all medications to this visit? Yes

## 2020-09-04 NOTE — Progress Notes (Signed)
Progress Note  Patient Name: Jeff Cobb Date of Encounter: 09/04/2020  Primary Cardiologist: End  Subjective   Dyspnea much improved. No chest pain. Back on room air. Breathing back to baseline. No lower extremity swelling. No documented UOP for this admission to date. No documented weight this admission. SCR 1.52-->1.3. Potassium 3.2.    Inpatient Medications    Scheduled Meds: . aspirin EC  81 mg Oral Daily  . atorvastatin  80 mg Oral Daily  . carvedilol  25 mg Oral BID  . enoxaparin (LOVENOX) injection  0.5 mg/kg Subcutaneous Q24H  . furosemide  40 mg Intravenous BID  . nicotine  14 mg Transdermal Daily  . sodium chloride flush  3 mL Intravenous Q12H  . spironolactone  12.5 mg Oral Daily   Continuous Infusions: . sodium chloride     PRN Meds: sodium chloride, acetaminophen, ondansetron (ZOFRAN) IV, sodium chloride flush   Vital Signs    Vitals:   09/04/20 0200 09/04/20 0400 09/04/20 0438 09/04/20 0600  BP: 117/73 (!) 125/94  (!) 125/97  Pulse: 71 78  77  Resp: 18 (!) 25 (!) 22 (!) 23  Temp:  98.7 F (37.1 C) 97.7 F (36.5 C)   TempSrc:  Oral    SpO2: 93% 96%  93%  Weight:       No intake or output data in the 24 hours ending 09/04/20 0824 Filed Weights   09/02/20 1917  Weight: 95.8 kg    Telemetry    SR - Personally Reviewed  ECG    No new tracings - Personally Reviewed  Physical Exam   GEN: No acute distress.   Neck: No JVD. Cardiac: RRR, no murmurs, rubs, or gallops.  Respiratory: Clear to auscultation bilaterally.  GI: Soft, nontender, non-distended.   MS: No edema; No deformity. Neuro:  Alert and oriented x 3; Nonfocal.  Psych: Normal affect.  Labs    Chemistry Recent Labs  Lab 09/02/20 1921 09/02/20 2327 09/03/20 0203  NA 142  --  136  K 3.7  --  3.5  CL 100  --  97*  CO2 24  --  24  GLUCOSE 152*  --  133*  BUN 28*  --  32*  CREATININE 1.59* 1.45* 1.52*  CALCIUM 9.4  --  9.1  GFRNONAA 54* 60* 57*  ANIONGAP 18*  --  15      Hematology Recent Labs  Lab 09/02/20 1921 09/02/20 2327  WBC 18.6* 17.4*  RBC 4.67 4.35  HGB 15.4 14.4  HCT 42.6 39.3  MCV 91.2 90.3  MCH 33.0 33.1  MCHC 36.2* 36.6*  RDW 13.8 13.6  PLT 423* 300    Cardiac EnzymesNo results for input(s): TROPONINI in the last 168 hours. No results for input(s): TROPIPOC in the last 168 hours.   BNP Recent Labs  Lab 09/02/20 1921  BNP 1,859.9*     DDimer No results for input(s): DDIMER in the last 168 hours.   Radiology    DG Chest Portable 1 View  Result Date: 09/02/2020 IMPRESSION: Cardiomegaly with vascular congestion and hazy bilateral lung opacity probably due to pulmonary edema. Electronically Signed   By: Donavan Foil M.D.   On: 09/02/2020 19:34  Cardiac Studies   2D echo 09/03/2020: 1. Left ventricular ejection fraction, by estimation, is <20%. The left  ventricle has severely decreased function. The left ventricle demonstrates  global hypokinesis. The left ventricular internal cavity size was severely  dilated. There is mild left  ventricular hypertrophy.  Left ventricular diastolic parameters are  consistent with Grade II diastolic dysfunction (pseudonormalization).  Elevated left atrial pressure.  2. Right ventricular systolic function is mildly reduced. The right  ventricular size is mildly enlarged. Tricuspid regurgitation signal is  inadequate for assessing PA pressure.  3. Left atrial size was mild to moderately dilated.  4. Right atrial size was moderately dilated.  5. The mitral valve is grossly normal. Mild mitral valve regurgitation.  No evidence of mitral stenosis.  6. The aortic valve is tricuspid. Aortic valve regurgitation is not  visualized. No aortic stenosis is present.  7. Moderately dilated pulmonary artery. __________  2D echo 09/2018: - Left ventricle: The cavity size was severely dilated. Systolic  function was severely reduced. The estimated ejection fraction  was less than 20%  Severe global hypokinesis. Wall motion of basal  to mid inferior wall best preserved. The study is not technically  sufficient to allow evaluation of LV diastolic function.  - Mitral valve: There was mild regurgitation.  - Right ventricle: Systolic function was normal.  - Pulmonary arteries: Systolic pressure could not be accurately  estimated.  __________  2D echo 06/2016: - Left ventricle: The cavity size was mildly dilated. Systolic  function was severely reduced. The estimated ejection fraction  was in the range of 25% to 30%. Severe anterior, anteroseptal and  apical wall hypokinesis. Doppler parameters are consistent with  abnormal left ventricular relaxation (grade 1 diastolic  dysfunction).  - Left atrium: The atrium was normal in size.  - Right ventricle: Systolic function was normal.  - Pulmonary arteries: Systolic pressure was within the normal  range.   Impressions:   - Unable to exclude mural thrombus in the apical region. __________  2D echo 12/2015: - Left ventricle: The cavity size was moderately dilated. There was  moderate concentric hypertrophy. Systolic function was moderately  to severely reduced. The estimated ejection fraction was in the  range of 30% to 35%. Akinesis of the mid-apicalanteroseptal,  anterior, and apical myocardium. Left ventricular diastolic  function parameters were normal.  - Left atrium: The atrium was mildly dilated.  Patient Profile     49 y.o. 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 Dwyane Dee.  Assessment & Plan    1.BiV failure/dialated cardiomyopathy/ICM with pulmonary edema secondary to hypertensive heart disease: -Symptoms are significantly improved though  he does remain volume overloaded -Transition IV Lasix to oral 40 mg daily (overall, he is Lasix naive given medical nonadherence) -Add losartan 12.5 mg daily, consider transition to Entresto in the outpatient setting pending compliance and labs -Continue Coreg and spironolactone  -Will schedule him for a follow up BMP in 5 days -Can cancel his echo on 12/23 -Prior to consideration of potential ICD for the patient's underlying ischemic cardiomyopathy he will need to demonstrate medical adherence with medications and routine recommended follow-up.  He will likely need to be on maximally tolerated GDMT for several more months followed by a repeat limited echo in the outpatient setting to assist in this decision -As an outpatient, recommend referral to the advanced heart failure clinic and EP -Replete potassium  -Close monitoring of renal function -Wean supplemental oxygen as able -Echo showed a persistent cardiomyopathy with an EF < 20%, in the  setting of medical noncompliance  -Likely exacerbated by under diuresis, medical and dietary noncompliance, and hypertensive heart disease -Importance of medical adherence was discussed in detail -No documented weights or UOP this admission  2.  CAD involving the native coronary arteries: -Never with chest pain -High-sensitivity troponin negative x2 -Echo as above -ASA -Lipitor -No plans for inpatient ischemic evaluation at this time  3. CKD stage IIIb: -SCr improving   4. Tobacco/alcohol use: -Cessation advised   5. Hypokalemia: -KCl 20 mEq x 1 -Spironolactone -[place] -BMP 5 days  Dispo: -Ok for discharge on current medications -Follow up BMP in 5 days -We will arrange follow up next week in our office   For questions or updates, please contact [place] Please consult [email or web address] for contact info under Cardiology/STEMI.    Signed, Christell Faith, PA-C [place] Pager: [phone] 09/04/2020, 8:24 AM

## 2020-09-08 LAB — CULTURE, BLOOD (ROUTINE X 2)
Culture: NO GROWTH
Culture: NO GROWTH
Special Requests: ADEQUATE

## 2020-09-09 ENCOUNTER — Other Ambulatory Visit (INDEPENDENT_AMBULATORY_CARE_PROVIDER_SITE_OTHER): Payer: BC Managed Care – PPO

## 2020-09-09 ENCOUNTER — Other Ambulatory Visit: Payer: Self-pay

## 2020-09-09 DIAGNOSIS — I110 Hypertensive heart disease with heart failure: Secondary | ICD-10-CM

## 2020-09-09 DIAGNOSIS — I2510 Atherosclerotic heart disease of native coronary artery without angina pectoris: Secondary | ICD-10-CM

## 2020-09-09 DIAGNOSIS — Z9861 Coronary angioplasty status: Secondary | ICD-10-CM

## 2020-09-10 ENCOUNTER — Telehealth: Payer: Self-pay | Admitting: Family

## 2020-09-10 LAB — BASIC METABOLIC PANEL
BUN/Creatinine Ratio: 15 (ref 9–20)
BUN: 22 mg/dL (ref 6–24)
CO2: 24 mmol/L (ref 20–29)
Calcium: 9.4 mg/dL (ref 8.7–10.2)
Chloride: 99 mmol/L (ref 96–106)
Creatinine, Ser: 1.44 mg/dL — ABNORMAL HIGH (ref 0.76–1.27)
GFR calc Af Amer: 66 mL/min/{1.73_m2} (ref >59)
GFR calc non Af Amer: 57 mL/min/{1.73_m2} — ABNORMAL LOW (ref >59)
Glucose: 100 mg/dL — ABNORMAL HIGH (ref 65–99)
Potassium: 4.1 mmol/L (ref 3.5–5.2)
Sodium: 140 mmol/L (ref 134–144)

## 2020-09-10 NOTE — Telephone Encounter (Signed)
Error

## 2020-09-12 ENCOUNTER — Other Ambulatory Visit: Payer: BLUE CROSS/BLUE SHIELD

## 2020-09-12 ENCOUNTER — Encounter: Payer: Self-pay | Admitting: Family

## 2020-09-12 ENCOUNTER — Ambulatory Visit (INDEPENDENT_AMBULATORY_CARE_PROVIDER_SITE_OTHER): Payer: BC Managed Care – PPO | Admitting: Family

## 2020-09-12 ENCOUNTER — Other Ambulatory Visit: Payer: Self-pay

## 2020-09-12 VITALS — BP 122/86 | HR 86 | Ht 67.0 in | Wt 200.0 lb

## 2020-09-12 DIAGNOSIS — Z79899 Other long term (current) drug therapy: Secondary | ICD-10-CM

## 2020-09-12 DIAGNOSIS — Z789 Other specified health status: Secondary | ICD-10-CM

## 2020-09-12 DIAGNOSIS — I110 Hypertensive heart disease with heart failure: Secondary | ICD-10-CM

## 2020-09-12 DIAGNOSIS — F17200 Nicotine dependence, unspecified, uncomplicated: Secondary | ICD-10-CM

## 2020-09-12 DIAGNOSIS — Z7289 Other problems related to lifestyle: Secondary | ICD-10-CM

## 2020-09-12 DIAGNOSIS — I2510 Atherosclerotic heart disease of native coronary artery without angina pectoris: Secondary | ICD-10-CM | POA: Diagnosis not present

## 2020-09-12 DIAGNOSIS — E876 Hypokalemia: Secondary | ICD-10-CM

## 2020-09-12 DIAGNOSIS — E785 Hyperlipidemia, unspecified: Secondary | ICD-10-CM

## 2020-09-12 DIAGNOSIS — I255 Ischemic cardiomyopathy: Secondary | ICD-10-CM | POA: Diagnosis not present

## 2020-09-12 DIAGNOSIS — N1831 Chronic kidney disease, stage 3a: Secondary | ICD-10-CM

## 2020-09-12 DIAGNOSIS — Z9861 Coronary angioplasty status: Secondary | ICD-10-CM

## 2020-09-12 MED ORDER — LOSARTAN POTASSIUM 25 MG PO TABS: 25.0000 mg | ORAL_TABLET | Freq: Every day | ORAL | 5 refills | Status: DC

## 2020-09-12 NOTE — Patient Instructions (Addendum)
Medication Instructions:  Your physician has recommended you make the following change in your medication:   CHANGE Losartan to 25mg  (one tablet) daily. An Rx has been sent to your pharmacy.  *If you need a refill on your cardiac medications before your next appointment, please call your pharmacy*   Lab Work: None ordered today  Testing/Procedures: Your echocardiogram in the hospital showed your heart pumping function was <20%. Our goal is for this to be 50-65%. The medications you are on will help strengthen your heart pumping function.   Follow-Up: At [facility], you and your health needs are our priority.  As part of our continuing mission to provide you with exceptional heart care, we have created designated Provider Care Teams.  These Care Teams include your primary Cardiologist (physician) and Advanced Practice Providers (APPs -  Physician Assistants and Nurse Practitioners) who all work together to provide you with the care you need, when you need it.  We recommend signing up for the patient portal called "MyChart".  Sign up information is provided on this After Visit Summary.  MyChart is used to connect with patients for Virtual Visits (Telemedicine).  Patients are able to view lab/test results, encounter notes, upcoming appointments, etc.  Non-urgent messages can be sent to your provider as well.   To learn more about what you can do with MyChart, go to [email or web address].    Your next appointment:   As scheduled 10/02/19  Other Instructions    Heart Failure, Diagnosis  Heart failure means that your heart is not able to pump blood in the right way. This makes it hard for your body to work well. Heart failure is usually a long-term (chronic) condition. You must take good care of yourself and follow your treatment plan from your doctor. What are the causes? This condition may be caused by:  High blood pressure.  Build up of cholesterol and fat in the  arteries.  Heart attack. This injures the heart muscle.  Heart valves that do not open and close properly.  Damage of the heart muscle. This is also called cardiomyopathy.  Lung disease.  Abnormal heart rhythms. What increases the risk? The risk of heart failure goes up as a person ages. This condition is also more likely to develop in people who:  Are overweight.  Are male.  Smoke or chew tobacco.  Abuse alcohol or illegal drugs.  Have taken medicines that can damage the heart.  Have diabetes.  Have abnormal heart rhythms.  Have thyroid problems.  Have low blood counts (anemia). What are the signs or symptoms? Symptoms of this condition include:  Shortness of breath.  Coughing.  Swelling of the feet, ankles, legs, or belly.  Losing weight for no reason.  Trouble breathing.  Waking from sleep because of the need to sit up and get more air.  Rapid heartbeat.  Being very tired.  Feeling dizzy, or feeling like you may pass out (faint).  Having no desire to eat.  Feeling like you may vomit (nauseous).  Peeing (urinating) more at night.  Feeling confused. How is this treated?     This condition may be treated with:  Medicines. These can be given to treat blood pressure and to make the heart muscles stronger.  Changes in your daily life. These may include eating a healthy diet, staying at a healthy body weight, quitting tobacco and illegal drug use, or doing exercises.  Surgery. Surgery can be done to open blocked valves, or to put devices  in the heart, such as pacemakers.  A donor heart (heart transplant). You will receive a healthy heart from a donor. Follow these instructions at home:  Treat other conditions as told by your doctor. These may include high blood pressure, diabetes, thyroid disease, or abnormal heart rhythms.  Learn as much as you can about heart failure.  Get support as you need it.  Keep all follow-up visits as told by your  doctor. This is important. Summary  Heart failure means that your heart is not able to pump blood in the right way.  This condition is caused by high blood pressure, heart attack, or damage of the heart muscle.  Symptoms of this condition include shortness of breath and swelling of the feet, ankles, legs, or belly. You may also feel very tired or feel like you may vomit.  You may be treated with medicines, surgery, or changes in your daily life.  Treat other health conditions as told by your doctor. This information is not intended to replace advice given to you by your health care provider. Make sure you discuss any questions you have with your health care provider. Document Revised: 11/25/2018 Document Reviewed: 11/25/2018 Elsevier Patient Education  [place] with Quitting Smoking  Quitting smoking is a physical and mental challenge. You will face cravings, withdrawal symptoms, and temptation. Before quitting, work with your health care provider to make a plan that can help you cope. Preparation can help you quit and keep you from giving in. How can I cope with cravings? Cravings usually last for 5-10 minutes. If you get through it, the craving will pass. Consider taking the following actions to help you cope with cravings:  Keep your mouth busy: ? Chew sugar-free gum. ? Suck on hard candies or a straw. ? Brush your teeth.  Keep your hands and body busy: ? Immediately change to a different activity when you feel a craving. ? Squeeze or play with a ball. ? Do an activity or a hobby, like making bead jewelry, practicing needlepoint, or working with wood. ? Mix up your normal routine. ? Take a short exercise break. Go for a quick walk or run up and down stairs. ? Spend time in public places where smoking is not allowed.  Focus on doing something kind or helpful for someone else.  Call a friend or family member to talk during a craving.  Join a support  group.  Call a quit line, such as 1-800-QUIT-NOW.  Talk with your health care provider about medicines that might help you cope with cravings and make quitting easier for you. How can I deal with withdrawal symptoms? Your body may experience negative effects as it tries to get used to not having nicotine in the system. These effects are called withdrawal symptoms. They may include:  Feeling hungrier than normal.  Trouble concentrating.  Irritability.  Trouble sleeping.  Feeling depressed.  Restlessness and agitation.  Craving a cigarette. To manage withdrawal symptoms:  Avoid places, people, and activities that trigger your cravings.  Remember why you want to quit.  Get plenty of sleep.  Avoid coffee and other caffeinated drinks. These may worsen some of your symptoms. How can I handle social situations? Social situations can be difficult when you are quitting smoking, especially in the first few weeks. To manage this, you can:  Avoid parties, bars, and other social situations where people might be smoking.  Avoid alcohol.  Leave right away if you have the  urge to smoke.  Explain to your family and friends that you are quitting smoking. Ask for understanding and support.  Plan activities with friends or family where smoking is not an option. What are some ways I can cope with stress? Wanting to smoke may cause stress, and stress can make you want to smoke. Find ways to manage your stress. Relaxation techniques can help. For example:  Breathe slowly and deeply, in through your nose and out through your mouth.  Listen to soothing, relaxing music.  Talk with a family member or friend about your stress.  Light a candle.  Soak in a bath or take a shower.  Think about a peaceful place. What are some ways I can prevent weight gain? Be aware that many people gain weight after they quit smoking. However, not everyone does. To keep from gaining weight, have a plan in  place before you quit and stick to the plan after you quit. Your plan should include:  Having healthy snacks. When you have a craving, it may help to: ? Eat plain popcorn, crunchy carrots, celery, or other cut vegetables. ? Chew sugar-free gum.  Changing how you eat: ? Eat small portion sizes at meals. ? Eat 4-6 small meals throughout the day instead of 1-2 large meals a day. ? Be mindful when you eat. Do not watch television or do other things that might distract you as you eat.  Exercising regularly: ? Make time to exercise each day. If you do not have time for a long workout, do short bouts of exercise for 5-10 minutes several times a day. ? Do some form of strengthening exercise, like weight lifting, and some form of aerobic exercise, like running or swimming.  Drinking plenty of water or other low-calorie or no-calorie drinks. Drink 6-8 glasses of water daily, or as much as instructed by your health care provider. Summary  Quitting smoking is a physical and mental challenge. You will face cravings, withdrawal symptoms, and temptation to smoke again. Preparation can help you as you go through these challenges.  You can cope with cravings by keeping your mouth busy (such as by chewing gum), keeping your body and hands busy, and making calls to family, friends, or a helpline for people who want to quit smoking.  You can cope with withdrawal symptoms by avoiding places where people smoke, avoiding drinks with caffeine, and getting plenty of rest.  Ask your health care provider about the different ways to prevent weight gain, avoid stress, and handle social situations. This information is not intended to replace advice given to you by your health care provider. Make sure you discuss any questions you have with your health care provider. Document Revised: 08/20/2017 Document Reviewed: 09/04/2016 Elsevier Patient Education  2020 [employer].

## 2020-09-12 NOTE — Progress Notes (Signed)
Office Visit    Patient Name: Jeff Cobb Date of Encounter: 09/12/2020  Primary Care Provider:  Luciana Axe, NP Primary Cardiologist:  Nelva Bush, MD Electrophysiologist:  None   Chief Complaint    Jeff Cobb is a 49 y.o. male with a hx of CAD s/p prior LAD stenting in 2016, ischemic cardiomyopathy with EF less than 20%, HFrEF, HTN, HLD presents today for hospital follow up.  Past Medical History    Past Medical History:  Diagnosis Date  . CAD S/P percutaneous coronary angioplasty 04/2015   a. 04/2015 Anterior STEMI/PCI @ [facility]: LM nl, mLAD 100% (3.0x15 Xience DES), LCx nl, OM1-3 nl, dRCA 50%, PDA nl.  . Chronic combined systolic and diastolic heart failure, NYHA class 2 ([place]) 04/2015   a. 12/2015 Echo: EF 30-35%; b. 09/2018 Echo: EF <20%, sev HK (was not taking meds for ~ 1 yr @ this time)  . Hyperlipidemia   . Hypertensive heart disease   . Ischemic cardiomyopathy    a. 04/2015 Echo: EF 25%, b. 12/2015 Echo: EF 30-35%; c. 09/2018 Echo: EF <20% (not taking meds).  . Tobacco abuse    Past Surgical History:  Procedure Laterality Date  . CORONARY ANGIOPLASTY WITH STENT PLACEMENT Left 04/2015   @ [facility]: mLAD 100% --> 3.0 x 15 Xience DES  . TRANSTHORACIC ECHOCARDIOGRAM  04/2015   @ [facility]: EF 25%, LVH, trivial MR, dilated thoracic aorta, anteroapical and apical akinesis and severely decreased contraction overall  . TRANSTHORACIC ECHOCARDIOGRAM  12/2015   Echo: EF 30-35%, mod conc LVH, mid-apicalanteroseptal, anterior, and apical AK, mildly dil LA    Allergies  No Known Allergies  History of Present Illness    Jeff Cobb is a 49 y.o. male with a hx of CAD s/p prior LAD stenting in 2016, ischemic cardiomyopathy with EF less than 20%, HFrEF, HTN, HLD.  He was last seen while hospitalized  He suffered an anterior STEMI August 2016 requiring DES to LAD at [facility].  Subsequently noted to have ischemic cardiomyopathy and HFrEF with EF 25% at the time.  Subsequent echo in 2017  with EF 30-35%.  Seen in clinic 3/28 gene and follow-up blood work showed renal insufficiency and therefore Entresto and spironolactone were held.  He was then lost to follow-up.  Echo January 2020 with EF less than 20%.  He was seen in clinic 11/29/2018 noted to be off all of his medications for a year prior to that echocardiogram. His atorvastatin 80 mg, Coreg 25 mg twice daily, Plavix 75 mg, Zetia 10 mg, fenofibrate 145 mg, Lasix 40 mg, spironolactone 25 mg were continued.  Lab work was collected. His potassium was increased to 52mEq daily.  Seen in follow up 05/09/20 with reported intermittent compliance with his heart failure medications over the last year. It was very unclear where he was filling medications as previous [facility] not filled in 2 years and [facility] had only filled Lasix. He had lab work with hypokalemia 2.7 recommended to hold Lasix and increase Potassium. He did not have repeat labs as recommended. Seen in follow up 05/09/20 still with difficulties with medication compliance.   He was subsequently hospitalized 09/03/11/15/21 due to acute pulmonary edema in the setting of volume overload and noncompliance with medications. He was IV diuresed and discharged on atorvastatin 80 mg, Coreg 25 mg twice daily, furosemide 40 mg daily, losartan 12.5 mg daily, spironolactone 12.5 mg daily.  Echocardiogram during admission pulses 4/21 with LVEF less than 20%, LV severely dilated,  mild LVH, grade 2 diastolic dysfunction, RV SF mildly reduced, RV mildly enlarged, LA mild to moderately dilated, RA moderately dilated, mild MR, moderate.  He presents today for follow-up.  He reports compliance with all his medications.  Tells me he is feeling much better since his hospitalization.  No shortness of breath or dyspnea on exertion.  Denies chest pain, pressure, tightness.  Endorses compliance with daily Lasix denies difficulties with urinary frequency.  He is back to work and doing well.  Tells me he has not  smoked since hospital discharge and is very motivated to quit.   EKGs/Labs/Other Studies Reviewed:   The following studies were reviewed today:  2D echo 09/03/2020: 1. Left ventricular ejection fraction, by estimation, is <20%. The left  ventricle has severely decreased function. The left ventricle demonstrates  global hypokinesis. The left ventricular internal cavity size was severely  dilated. There is mild left  ventricular hypertrophy. Left ventricular diastolic parameters are  consistent with Grade II diastolic dysfunction (pseudonormalization).  Elevated left atrial pressure.   2. Right ventricular systolic function is mildly reduced. The right  ventricular size is mildly enlarged. Tricuspid regurgitation signal is  inadequate for assessing PA pressure.   3. Left atrial size was mild to moderately dilated.   4. Right atrial size was moderately dilated.   5. The mitral valve is grossly normal. Mild mitral valve regurgitation.  No evidence of mitral stenosis.   6. The aortic valve is tricuspid. Aortic valve regurgitation is not  visualized. No aortic stenosis is present.   7. Moderately dilated pulmonary artery. __________   2D echo 09/2018: - Left ventricle: The cavity size was severely dilated. Systolic    function was severely reduced. The estimated ejection fraction    was less than 20% Severe global hypokinesis. Wall motion of basal    to mid inferior wall best preserved. The study is not technically    sufficient to allow evaluation of LV diastolic function.  - Mitral valve: There was mild regurgitation.  - Right ventricle: Systolic function was normal.  - Pulmonary arteries: Systolic pressure could not be accurately    estimated.  __________   2D echo 06/2016: - Left ventricle: The cavity size was mildly dilated. Systolic    function was severely reduced. The estimated ejection fraction    was in the range of 25% to 30%. Severe anterior, anteroseptal and    apical  wall hypokinesis. Doppler parameters are consistent with    abnormal left ventricular relaxation (grade 1 diastolic    dysfunction).  - Left atrium: The atrium was normal in size.  - Right ventricle: Systolic function was normal.  - Pulmonary arteries: Systolic pressure was within the normal    range.   Impressions:   - Unable to exclude mural thrombus in the apical region. __________   2D echo 12/2015: - Left ventricle: The cavity size was moderately dilated. There was    moderate concentric hypertrophy. Systolic function was moderately    to severely reduced. The estimated ejection fraction was in the    range of 30% to 35%. Akinesis of the mid-apicalanteroseptal,    anterior, and apical myocardium. Left ventricular diastolic    function parameters were normal.  - Left atrium: The atrium was mildly dilated.  EKG: No EKG is ordered today.   Recent Labs: 05/09/2020: ALT 21 08/21/2020: Magnesium 1.6 09/02/2020: B Natriuretic Peptide 1,859.9 09/04/2020: Hemoglobin 14.5; Platelets 318 09/09/2020: BUN 22; Creatinine, Ser 1.44; Potassium 4.1; Sodium 140  Recent  Lipid Panel    Component Value Date/Time   CHOL 315 (H) 05/09/2020 0927   TRIG 210 (H) 05/09/2020 0927   HDL 122 05/09/2020 0927   CHOLHDL 2.6 05/09/2020 0927   CHOLHDL NOT REPORTED DUE TO HIGH TRIGLYCERIDES 03/16/2017 0734   VLDL UNABLE TO CALCULATE IF TRIGLYCERIDE OVER 400 mg/dL 03/16/2017 0734   LDLCALC 156 (H) 05/09/2020 0927   LDLDIRECT 70 12/23/2016 0725   Home Medications   Current Meds  Medication Sig  . acetaminophen (TYLENOL) 325 MG tablet Take 650 mg by mouth every 6 (six) hours as needed for mild pain, fever or headache.  Marland Kitchen aspirin EC 81 MG tablet Take 81 mg by mouth daily.  Marland Kitchen atorvastatin (LIPITOR) 80 MG tablet Take 1 tablet (80 mg total) by mouth daily.  . carvedilol (COREG) 25 MG tablet Take 1 tablet (25 mg total) by mouth 2 (two) times daily.  . cyanocobalamin 1000 MCG tablet Take 3,000 mcg by mouth  daily.  . furosemide (LASIX) 40 MG tablet Take 1 tablet (40 mg total) by mouth daily.  Marland Kitchen losartan (COZAAR) 25 MG tablet Take 0.5 tablets (12.5 mg total) by mouth daily.  . Omega-3 Fatty Acids (FISH OIL PO) Take by mouth daily.  Marland Kitchen spironolactone (ALDACTONE) 25 MG tablet Take 0.5 tablets (12.5 mg total) by mouth daily.    Review of Systems   All other systems reviewed and are otherwise negative except as noted above.  Physical Exam   VS:  BP 122/86 (BP Location: Left Arm, Patient Position: Sitting, Cuff Size: Normal)   Pulse 86   Ht 5\' 7"  (1.702 m)   Wt 200 lb (90.7 kg)   SpO2 97%   BMI 31.32 kg/m  , BMI Body mass index is 31.32 kg/m. GEN: Well nourished, well developed, in no acute distress. HEENT: normal. Neck: Supple, no JVD, carotid bruits, or masses. Cardiac: RRR, no murmurs, rubs, or gallops. No clubbing, cyanosis, edema.  Radials/PT 2+ and equal bilaterally.  Respiratory:  Respirations regular and unlabored, clear to auscultation bilaterally. GI: Soft, nontender, nondistended, BS + x 4. MS: No deformity or atrophy. Skin: Warm and dry, no rash. Neuro:  Strength and sensation are intact. Psych: Normal affect.  Assessment & Plan   1. CAD s/p prior STEMI with LAD stenting in 2016 -  GDMT includes aspirin, beta-blocker, statin, PRN Nitroglycerin.  Denies anginal symptoms.  No indication for ischemic evaluation at this time.  2. HFrEF/ischemic cardiomyopathy -euvolemic and well compensated on exam.  Reports resolution of dyspnea since hospital discharge and adjustment to medication regimen.  Reports compliance with all cardiac medications.  Lab work 09/09/2020 showed K4.1, creatinine 1.44, GFR 66 which is overall stable.  GDMT includes Coreg 25 mg BID, Lasix 40 mg QD, spironolactone 12.5 mg QD.  Will increase losartan from 12.5 mg to 25 mg daily.    At next follow-up if he is tolerating cardiac medications and compliant, consider transition from Losartan to [employer].  Future  considerations include the addition of SGLT2 I.  Prior to consideration for potential ICD for underlying ischemic cardiomyopathy will need to demonstrate medical adherence.  Plan for repeat echocardiogram in approximately 3 to 4 months for reassessment of LVEF.  If LVEF remains less than 35% at that time, referred to EP.  3. HLD, LDL goal <70 - Lipid panel 05/09/2020 total cholesterol 315, triglycerides 210, HDL 122, LDL 156.  Atorvastatin 80 mg daily was resumed 08/2020.  Will need repeat lipid panel in 2 months.   4.  Hypokalemia -09/10/2019 1K4.1.  Continue spironolactone 12.5 mg daily.  No indication for additional potassium supplementation at this time.  5. Tobacco/alcohol use- Has not smoked since hospital discharge.  Congratulated on smoking cessation. Continued smoking cessation encouraged. Recommend utilization of 1800QUITNOW.  6. CKDIII -careful titration of diuretics and antihypertensives.  09/09/2020 lab work with creatinine 1.44, GFR 66 stable compared to previous.  Disposition:  Follow up  in 3 week(s)  with Dr. Saunders Revel or APP as previously scheduled.   Loel Dubonnet, NP 09/12/2020, 8:38 AM

## 2020-09-17 NOTE — Progress Notes (Deleted)
Office Visit    Patient Name: Jeff Cobb Date of Encounter: 09/17/2020  Primary Care Provider:  Loura Pardon, NP Primary Cardiologist:  Yvonne Kendall, MD Electrophysiologist:  None   Chief Complaint    Jeff Cobb is a 49 y.o. male with a hx of CAD s/p prior LAD stenting in 2016, ischemic cardiomyopathy with EF less than 20%, HFrEF, HTN, HLD presents today for follow up of HFrEf  Past Medical History    Past Medical History:  Diagnosis Date  . CAD S/P percutaneous coronary angioplasty 04/2015   a. 04/2015 Anterior STEMI/PCI @ [facility]: LM nl, mLAD 100% (3.0x15 Xience DES), LCx nl, OM1-3 nl, dRCA 50%, PDA nl.  . Chronic combined systolic and diastolic heart failure, NYHA class 2 ([facility]) 04/2015   a. 12/2015 Echo: EF 30-35%; b. 09/2018 Echo: EF <20%, sev HK (was not taking meds for ~ 1 yr @ this time)  . Hyperlipidemia   . Hypertensive heart disease   . Ischemic cardiomyopathy    a. 04/2015 Echo: EF 25%, b. 12/2015 Echo: EF 30-35%; c. 09/2018 Echo: EF <20% (not taking meds).  . Tobacco abuse    Past Surgical History:  Procedure Laterality Date  . CORONARY ANGIOPLASTY WITH STENT PLACEMENT Left 04/2015   @ [facility]: mLAD 100% --> 3.0 x 15 Xience DES  . TRANSTHORACIC ECHOCARDIOGRAM  04/2015   @ [facility]: EF 25%, LVH, trivial MR, dilated thoracic aorta, anteroapical and apical akinesis and severely decreased contraction overall  . TRANSTHORACIC ECHOCARDIOGRAM  12/2015   Echo: EF 30-35%, mod conc LVH, mid-apicalanteroseptal, anterior, and apical AK, mildly dil LA    Allergies  No Known Allergies  History of Present Illness    Jeff Cobb is a 49 y.o. male with a hx of CAD s/p prior LAD stenting in 2016, ischemic cardiomyopathy with EF less than 20%, HFrEF, HTN, HLD.  He was last seen 09/12/20.   He suffered an anterior STEMI August 2016 requiring DES to LAD at [facility].  Subsequently noted to have ischemic cardiomyopathy and HFrEF with EF 25% at the time.  Subsequent echo in 2017 with EF  30-35%.  Seen in clinic 3/28 gene and follow-up blood work showed renal insufficiency and therefore Entresto and spironolactone were held.  He was then lost to follow-up.  Echo January 2020 with EF less than 20%.  He was seen in clinic 11/29/2018 noted to be off all of his medications for a year prior to that echocardiogram. His atorvastatin 80 mg, Coreg 25 mg twice daily, Plavix 75 mg, Zetia 10 mg, fenofibrate 145 mg, Lasix 40 mg, spironolactone 25 mg were continued.  Lab work was collected. His potassium was increased to daily.  Seen in follow up 05/09/20 with reported intermittent compliance with his heart failure medications over the last year. It was very unclear where he was filling medications as [facility] not [facility] and [facility] had only filled Lasix. He had lab work with hypokalemia 2.7 recommended to hold Lasix and increase Potassium. He did not have repeat labs as recommended. Seen in follow up 05/09/20 still with difficulties with medication compliance.   He was subsequently hospitalized 09/03/11/15/21 due to acute pulmonary edema in the setting of volume overload and noncompliance with medications. He was IV diuresed and discharged on atorvastatin 80 mg, Coreg 25 mg twice daily, furosemide 40 mg daily, losartan 12.5 mg daily, spironolactone 12.5 mg daily.  Echocardiogram during admission pulses 4/21 with LVEF less than 20%, LV severely  dilated, mild LVH, grade 2 diastolic dysfunction, RV SF mildly reduced, RV mildly enlarged, LA mild to moderately dilated, RA moderately dilated, mild MR, moderate.  Seen in follow up 09/12/20 with reported resolution of dyspnea. His Losartan was increased to 25mg  daily in hopes he would be able to tolerate Entresto in the future.   ***   EKGs/Labs/Other Studies Reviewed:   The following studies were reviewed today:  2D echo 09/03/2020: 1. Left ventricular ejection fraction, by estimation, is <20%. The left  ventricle has severely  decreased function. The left ventricle demonstrates  global hypokinesis. The left ventricular internal cavity size was severely  dilated. There is mild left  ventricular hypertrophy. Left ventricular diastolic parameters are  consistent with Grade II diastolic dysfunction (pseudonormalization).  Elevated left atrial pressure.   2. Right ventricular systolic function is mildly reduced. The right  ventricular size is mildly enlarged. Tricuspid regurgitation signal is  inadequate for assessing PA pressure.   3. Left atrial size was mild to moderately dilated.   4. Right atrial size was moderately dilated.   5. The mitral valve is grossly normal. Mild mitral valve regurgitation.  No evidence of mitral stenosis.   6. The aortic valve is tricuspid. Aortic valve regurgitation is not  visualized. No aortic stenosis is present.   7. Moderately dilated pulmonary artery. __________   2D echo 09/2018: - Left ventricle: The cavity size was severely dilated. Systolic    function was severely reduced. The estimated ejection fraction    was less than 20% Severe global hypokinesis. Wall motion of basal    to mid inferior wall best preserved. The study is not technically    sufficient to allow evaluation of LV diastolic function.  - Mitral valve: There was mild regurgitation.  - Right ventricle: Systolic function was normal.  - Pulmonary arteries: Systolic pressure could not be accurately    estimated.  __________   2D echo 06/2016: - Left ventricle: The cavity size was mildly dilated. Systolic    function was severely reduced. The estimated ejection fraction    was in the range of 25% to 30%. Severe anterior, anteroseptal and    apical wall hypokinesis. Doppler parameters are consistent with    abnormal left ventricular relaxation (grade 1 diastolic    dysfunction).  - Left atrium: The atrium was normal in size.  - Right ventricle: Systolic function was normal.  - Pulmonary arteries: Systolic  pressure was within the normal    range.   Impressions:   - Unable to exclude mural thrombus in the apical region. __________   2D echo 12/2015: - Left ventricle: The cavity size was moderately dilated. There was    moderate concentric hypertrophy. Systolic function was moderately    to severely reduced. The estimated ejection fraction was in the    range of 30% to 35%. Akinesis of the mid-apicalanteroseptal,    anterior, and apical myocardium. Left ventricular diastolic    function parameters were normal.  - Left atrium: The atrium was mildly dilated.  EKG: No EKG is ordered today.   Recent Labs: 05/09/2020: ALT 21 08/21/2020: Magnesium 1.6 09/02/2020: B Natriuretic Peptide 1,859.9 09/04/2020: Hemoglobin 14.5; Platelets 318 09/09/2020: BUN 22; Creatinine, Ser 1.44; Potassium 4.1; Sodium 140  Recent Lipid Panel    Component Value Date/Time   CHOL 315 (H) 05/09/2020 0927   TRIG 210 (H) 05/09/2020 0927   HDL 122 05/09/2020 0927   CHOLHDL 2.6 05/09/2020 0927   CHOLHDL NOT REPORTED DUE TO HIGH  TRIGLYCERIDES 03/16/2017 0734   VLDL UNABLE TO CALCULATE IF TRIGLYCERIDE OVER 400 mg/dL 93/79/0240 9735   LDLCALC 156 (H) 05/09/2020 0927   LDLDIRECT 70 12/23/2016 0725   Home Medications   No outpatient medications have been marked as taking for the 10/01/20 encounter (Appointment) with Alver Sorrow, NP.    Review of Systems   All other systems reviewed and are otherwise negative except as noted above.  Physical Exam  *** VS:  There were no vitals taken for this visit. , BMI There is no height or weight on file to calculate BMI. GEN: Well nourished, well developed, in no acute distress. HEENT: normal. Neck: Supple, no JVD, carotid bruits, or masses. Cardiac: RRR, no murmurs, rubs, or gallops. No clubbing, cyanosis, edema.  Radials/PT 2+ and equal bilaterally.  Respiratory:  Respirations regular and unlabored, clear to auscultation bilaterally. GI: Soft, nontender,  nondistended, BS + x 4. MS: No deformity or atrophy. Skin: Warm and dry, no rash. Neuro:  Strength and sensation are intact. Psych: Normal affect.  Assessment & Plan   1. CAD s/p prior STEMI with LAD stenting in 2016 -  GDMT includes aspirin, beta-blocker, statin, PRN Nitroglycerin.  Denies anginal symptoms.  No indication for ischemic evaluation at this time.***  2. HFrEF/ischemic cardiomyopathy - ***Euvolemic and well compensated on exam.    GDMT includes Coreg 25 mg BID, Lasix 40 mg QD, spironolactone 12.5 mg QD, Losartan 25mg  QD.    Prior to consideration for potential ICD for underlying ischemic cardiomyopathy will need to demonstrate medical adherence.  Plan for repeat echocardiogram in approximately 3 to 4 months for reassessment of LVEF.  If LVEF remains less than 35% at that time, referred to EP.  3. HLD, LDL goal <70 - Lipid panel 05/09/2020 total cholesterol 315, triglycerides 210, HDL 122, LDL 156.  Atorvastatin 80 mg daily was resumed 08/2020.  Will need repeat lipid panel in 2 months. ***  4. Hypokalemia -09/10/2019 1K4.1.  Continue spironolactone 12.5 mg daily.  No indication for additional potassium supplementation at this time.***  5. Tobacco/alcohol use-*** Has not smoked since hospital discharge.  Congratulated on smoking cessation. Continued smoking cessation encouraged. Recommend utilization of 1800QUITNOW.  6. CKDIII -careful titration of diuretics and antihypertensives.  ***  Disposition:  Follow up***  in 3 week(s)  with Dr. 09/12/2019 or APP as previously scheduled.   Okey Dupre, NP 09/17/2020, 4:31 PM

## 2020-10-01 ENCOUNTER — Ambulatory Visit: Payer: BC Managed Care – PPO | Admitting: Family

## 2020-10-03 ENCOUNTER — Telehealth: Payer: Self-pay | Admitting: Physician Assistant

## 2020-10-03 DIAGNOSIS — E785 Hyperlipidemia, unspecified: Secondary | ICD-10-CM

## 2020-10-03 MED ORDER — ATORVASTATIN CALCIUM 80 MG PO TABS: 80.0000 mg | ORAL_TABLET | Freq: Every day | ORAL | 1 refills | Status: DC

## 2020-10-03 MED ORDER — LOSARTAN POTASSIUM 25 MG PO TABS: 25.0000 mg | ORAL_TABLET | Freq: Every day | ORAL | 1 refills | Status: DC

## 2020-10-03 NOTE — Telephone Encounter (Signed)
  Patient Consent for Virtual Visit         Jeff Cobb has provided verbal consent on 10/03/2020 for a virtual visit (video or telephone).   CONSENT FOR VIRTUAL VISIT FOR:  Jeff Cobb  By participating in this virtual visit I agree to the following:  I hereby voluntarily request, consent and authorize [facility] and its employed or contracted physicians, physician assistants, nurse practitioners or other licensed health care professionals (the Practitioner), to provide me with telemedicine health care services (the "Services") as deemed necessary by the treating Practitioner. I acknowledge and consent to receive the Services by the Practitioner via telemedicine. I understand that the telemedicine visit will involve communicating with the Practitioner through live audiovisual communication technology and the disclosure of certain medical information by electronic transmission. I acknowledge that I have been given the opportunity to request an in-person assessment or other available alternative prior to the telemedicine visit and am voluntarily participating in the telemedicine visit.  I understand that I have the right to withhold or withdraw my consent to the use of telemedicine in the course of my care at any time, without affecting my right to future care or treatment, and that the Practitioner or I may terminate the telemedicine visit at any time. I understand that I have the right to inspect all information obtained and/or recorded in the course of the telemedicine visit and may receive copies of available information for a reasonable fee.  I understand that some of the potential risks of receiving the Services via telemedicine include:  Marland Kitchen Delay or interruption in medical evaluation due to technological equipment failure or disruption; . Information transmitted may not be sufficient (e.g. poor resolution of images) to allow for appropriate medical decision making by the Practitioner; and/or   . In rare instances, security protocols could fail, causing a breach of personal health information.  Furthermore, I acknowledge that it is my responsibility to provide information about my medical history, conditions and care that is complete and accurate to the best of my ability. I acknowledge that Practitioner's advice, recommendations, and/or decision may be based on factors not within their control, such as incomplete or inaccurate data provided by me or distortions of diagnostic images or specimens that may result from electronic transmissions. I understand that the practice of medicine is not an exact science and that Practitioner makes no warranties or guarantees regarding treatment outcomes. I acknowledge that a copy of this consent can be made available to me via my patient portal ([facility]), or I can request a printed copy by calling the office of [facility].    I understand that my insurance will be billed for this visit.   I have read or had this consent read to me. . I understand the contents of this consent, which adequately explains the benefits and risks of the Services being provided via telemedicine.  . I have been provided ample opportunity to ask questions regarding this consent and the Services and have had my questions answered to my satisfaction. . I give my informed consent for the services to be provided through the use of telemedicine in my medical care

## 2020-10-03 NOTE — Telephone Encounter (Signed)
*  STAT* If patient is at the pharmacy, call can be transferred to refill team.   1. Which medications need to be refilled? (please list name of each medication and dose if known)    Atorvastatin 80 mg po BID  Losartan 25 mg po q d   2. Which pharmacy/location (including street and city if local pharmacy) is medication to be sent to?  [facility] [place], [place]   3. Do they need a 30 day or 90 day supply? [place]

## 2020-10-03 NOTE — Telephone Encounter (Signed)
Rx request sent to pharmacy.  

## 2020-10-04 NOTE — Progress Notes (Signed)
Virtual Visit via Telephone Note   This visit type was conducted due to national recommendations for restrictions regarding the COVID-19 Pandemic (e.g. social distancing) in an effort to limit this patient's exposure and mitigate transmission in our community.  Due to his co-morbid illnesses, this patient is at least at moderate risk for complications without adequate follow up.  This format is felt to be most appropriate for this patient at this time.  The patient did not have access to video technology/had technical difficulties with video requiring transitioning to audio format only (telephone).  All issues noted in this document were discussed and addressed.  No physical exam could be performed with this format.  Please refer to the patient's chart for his  consent to telehealth for [facility].    Date:  10/07/2020   ID:  Jeff Cobb, DOB [birth year], MRN [id number] The patient was identified using 2 identifiers.  Patient Location: Home Provider Location: Home Office  PCP:  Luciana Axe, NP  Cardiologist:  Nelva Bush, MD  Electrophysiologist:  None   Evaluation Performed:  Follow-Up Visit  Chief Complaint:  Follow up  History of Present Illness:    Jeff Cobb is a 50 y.o. 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  He was admitted to the hospital in 04/2015 with an anterior STEMI and underwent PCI/DES to the LAD at [facility].  He was subsequently noted to have an ischemic cardiomyopathy with an EF of 25% at that time.  Following PCI and medical management, echo in 2017, showed an EF of 30 to 35%.  He was seen in 2018 with plans to optimize evidence-based medical therapy though blood work at that time showed renal insufficiency leading to the holding of spironolactone and Entresto.  Following this, he was  lost to follow-up until he was seen in 11/2018.  It was noted the previously recommended echo in 2018 was obtained in 09/2018 and showed an EF of less than 20%.  When he reestablished care in 11/2018, it was noted the above echo performed in 09/2018 was performed in the setting of the patient being off all of his medications for approximately 1 year and had only resumed them a day or 2 prior to the study.  It was recommended he continue beta-blocker, spironolactone, and Lasix with recommendation to escalate ACE inhibitor/ARB/Entresto, if possible, pending lab work.  He was not seen again in the office until 04/2020 with reported intermittent adherence to medications over the prior year and a half.  Echo was recommended though not completed at that time.  He was seen in early 08/2020 with concerns regarding medication adherence.  Echo was again rescheduled.  He was subsequently admitted to the hospital 12/13 through 12/15 with volume overload and acute pulmonary edema in the context of medication nonadherence.  High-sensitivity troponin normal x2.  BNP 1859.  Echo on 09/03/2020 showed an EF of less than 20%, global hypokinesis, severely dilated LV cavity size, mild LVH, grade 2 diastolic dysfunction, mildly reduced RV systolic function with mildly enlarged RV cavity size, mild to moderately dilated left atrium, moderately dilated right atrium, mild mitral valve regurgitation, and moderately dilated pulmonary artery.  He was IV diuresed with symptom improvement.  He was discharged on GDMT.  He was seen in hospital follow-up in 09/12/2020 and reported adherence to medications and  that he was feeling much better following his hospitalization.  At that time, losartan was titrated to 25 mg daily, with plans to further optimize GDMT as able followed by repeat echo in several months time to reassess LV systolic function.  He is seen virtually today and is doing well from a cardiac perspective. He reports adherence to all  medications and is tolerating them without issues. His weight has been stable around 200-202 pounds. He is watching his salt intake and is drinking around 2 L of liquids per day. He has stable, longstanding, 2-pillow orthopnea. No chest pain, dyspnea, palpitations, dizziness, presyncope, syncope, lower extremity swelling, or early satiety. He reports his BP has been well controlled and checks [facility]. He does not have any BP readings for review today. He does not have any issues or concerns at this time.   08/2020 - BUN 22, SCr 1.44, potassium 4.1, HGB 14.5, PLT 318, magnesium 1.6 04/2020 - TC 315, TG 210, HDL 122, LDL 156, albumin 4.7, AST/ALT normal   The patient does not have symptoms concerning for COVID-19 infection (fever, chills, cough, or new shortness of breath).    Past Medical History:  Diagnosis Date  . CAD S/P percutaneous coronary angioplasty 04/2015   a. 04/2015 Anterior STEMI/PCI @ [facility]: LM nl, mLAD 100% (3.0x15 Xience DES), LCx nl, OM1-3 nl, dRCA 50%, PDA nl.  . Chronic combined systolic and diastolic heart failure, NYHA class 2 ([place]) 04/2015   a. 12/2015 Echo: EF 30-35%; b. 09/2018 Echo: EF <20%, sev HK (was not taking meds for ~ 1 yr @ this time)  . Hyperlipidemia   . Hypertensive heart disease   . Ischemic cardiomyopathy    a. 04/2015 Echo: EF 25%, b. 12/2015 Echo: EF 30-35%; c. 09/2018 Echo: EF <20% (not taking meds).  . Tobacco abuse    Past Surgical History:  Procedure Laterality Date  . CORONARY ANGIOPLASTY WITH STENT PLACEMENT Left 04/2015   @ [facility]: mLAD 100% --> 3.0 x 15 Xience DES  . TRANSTHORACIC ECHOCARDIOGRAM  04/2015   @ [facility]: EF 25%, LVH, trivial MR, dilated thoracic aorta, anteroapical and apical akinesis and severely decreased contraction overall  . TRANSTHORACIC ECHOCARDIOGRAM  12/2015   Echo: EF 30-35%, mod conc LVH, mid-apicalanteroseptal, anterior, and apical AK, mildly dil LA     Current Meds  Medication Sig  . acetaminophen (TYLENOL) 325 MG tablet  Take 650 mg by mouth every 6 (six) hours as needed for mild pain, fever or headache.  Marland Kitchen aspirin EC 81 MG tablet Take 81 mg by mouth daily.  Marland Kitchen atorvastatin (LIPITOR) 80 MG tablet Take 1 tablet (80 mg total) by mouth daily.  . carvedilol (COREG) 25 MG tablet Take 1 tablet (25 mg total) by mouth 2 (two) times daily.  . cyanocobalamin 1000 MCG tablet Take 3,000 mcg by mouth daily.  . furosemide (LASIX) 40 MG tablet Take 1 tablet (40 mg total) by mouth daily.  Marland Kitchen losartan (COZAAR) 25 MG tablet Take 1 tablet (25 mg total) by mouth daily.  . Omega-3 Fatty Acids (FISH OIL PO) Take by mouth daily.  Marland Kitchen spironolactone (ALDACTONE) 25 MG tablet Take 0.5 tablets (12.5 mg total) by mouth daily.     Allergies:   Patient has no known allergies.   Social History   Tobacco Use  . Smoking status: Current Some Day Smoker    Packs/day: 0.10  . Smokeless tobacco: Never Used  Vaping Use  . Vaping Use: Never used  Substance Use Topics  .  Alcohol use: Yes    Alcohol/week: 2.0 standard drinks    Types: 2 Shots of liquor per week    Comment: daily  . Drug use: Yes    Types: Marijuana     Family Hx: The patient's family history includes Diabetes Mellitus II in his father; Heart attack in his father; Heart disease in his father.  ROS:   Please see the history of present illness.     All other systems reviewed and are negative.   Prior CV studies:   The following studies were reviewed today:  2D echo 09/03/2020: 1. Left ventricular ejection fraction, by estimation, is <20%. The left  ventricle has severely decreased function. The left ventricle demonstrates  global hypokinesis. The left ventricular internal cavity size was severely  dilated. There is mild left  ventricular hypertrophy. Left ventricular diastolic parameters are  consistent with Grade II diastolic dysfunction (pseudonormalization).  Elevated left atrial pressure.  2. Right ventricular systolic function is mildly reduced. The right   ventricular size is mildly enlarged. Tricuspid regurgitation signal is  inadequate for assessing PA pressure.  3. Left atrial size was mild to moderately dilated.  4. Right atrial size was moderately dilated.  5. The mitral valve is grossly normal. Mild mitral valve regurgitation.  No evidence of mitral stenosis.  6. The aortic valve is tricuspid. Aortic valve regurgitation is not  visualized. No aortic stenosis is present.  7. Moderately dilated pulmonary artery. __________  2D echo 09/2018: - Left ventricle: The cavity size was severely dilated. Systolic  function was severely reduced. The estimated ejection fraction  was less than 20% Severe global hypokinesis. Wall motion of basal  to mid inferior wall best preserved. The study is not technically  sufficient to allow evaluation of LV diastolic function.  - Mitral valve: There was mild regurgitation.  - Right ventricle: Systolic function was normal.  - Pulmonary arteries: Systolic pressure could not be accurately  estimated. __________  2D echo 06/2016: - Left ventricle: The cavity size was mildly dilated. Systolic  function was severely reduced. The estimated ejection fraction  was in the range of 25% to 30%. Severe anterior, anteroseptal and  apical wall hypokinesis. Doppler parameters are consistent with  abnormal left ventricular relaxation (grade 1 diastolic  dysfunction).  - Left atrium: The atrium was normal in size.  - Right ventricle: Systolic function was normal.  - Pulmonary arteries: Systolic pressure was within the normal  range.   Impressions:   - Unable to exclude mural thrombus in the apical region. __________  2D echo 12/2015: - Left ventricle: The cavity size was moderately dilated. There was  moderate concentric hypertrophy. Systolic function was moderately  to severely reduced. The estimated ejection fraction was in the  range of 30% to 35%. Akinesis of the  mid-apicalanteroseptal,  anterior, and apical myocardium. Left ventricular diastolic  function parameters were normal.  - Left atrium: The atrium was mildly dilated.  Labs/Other Tests and Data Reviewed:    EKG:  No ECG reviewed.  Recent Labs: 05/09/2020: ALT 21 08/21/2020: Magnesium 1.6 09/02/2020: B Natriuretic Peptide 1,859.9 09/04/2020: Hemoglobin 14.5; Platelets 318 09/09/2020: BUN 22; Creatinine, Ser 1.44; Potassium 4.1; Sodium 140   Recent Lipid Panel Lab Results  Component Value Date/Time   CHOL 315 (H) 05/09/2020 09:27 AM   TRIG 210 (H) 05/09/2020 09:27 AM   HDL 122 05/09/2020 09:27 AM   CHOLHDL 2.6 05/09/2020 09:27 AM   CHOLHDL NOT REPORTED DUE TO HIGH TRIGLYCERIDES 03/16/2017 07:34 AM  LDLCALC 156 (H) 05/09/2020 09:27 AM   LDLDIRECT 70 12/23/2016 07:25 AM    Wt Readings from Last 3 Encounters:  10/07/20 202 lb (91.6 kg)  09/12/20 200 lb (90.7 kg)  09/04/20 211 lb 3.2 oz (95.8 kg)     Risk Assessment/Calculations:      Objective:    Vital Signs:  Ht 5\' 7"  (1.702 m)   Wt 202 lb (91.6 kg)   BMI 31.64 kg/m    VITAL SIGNS:  reviewed  ASSESSMENT & PLAN:    1. Chronic combined systolic and diastolic CHF/RV dysfunction/ICM: -Over the phone, he appears to be euvolemic and well compensated with NYHA class I symptoms -His weight has been stable at 200-202 pounds by his scale -No BPs available for review -Ideally, we would want to escalate his medical therapy at this time with transition from losartan to [facility] or add an SGLT2i. However, we are unable to do so at this time without recent BP for review. In this setting, he will check his [facility] later this week [facility] and contact us with those results, if we are able, we will then stop losartan and place him on Entresto with a follow up BMP thereafter -Look to escalate evidence based medical therapy as able -For now, we are forced to continue current medications without changes including Coreg,  losartan, Lasix, and spironolactone -Following escalation of maximally tolerated GDMT, he will undergo a repeat echo in several months time. If that at that point, his cardiomyopathy persists, and his EF is < 35%, he will need a R/LHC and referral to EP. QRS is narrow -CHF education   2.  CAD involving the native coronary arteries without angina: -No symptoms suggestive of angina -Continue current medications including, ASA, Lipitor, Coreg, and losartan -Aggressive risk factor modification  -No indication for ischemic evaluation at this time  3.  CKD stage III: -Stable on check last month -We will plan to update a BMET at his next visit in 3 weeks  4.  HLD: -LDL 156 from 04/2020 with goal being less than 70, he was resumed on Lipitor in 08/2020 -Intermittent medication adherence has been noted -Recheck fasting lipid panel and LFT in late 10/2020, if LDL remains above goal of 70 at that time, will add Zetia   5.  HTN: -Blood pressure is well controlled by his report, no readings were able to be provided -He will call us later this week, or early next week with [facility], if they allow, we will then transition him to [facility] in place of losartan with follow up BMET -For now, continue current medications as outlined above -Low sodium diet  6. Tobacco use: -Quit smoking in 12/021   COVID-19 Education: The signs and symptoms of COVID-19 were discussed with the patient and how to seek care for testing (follow up with PCP or arrange E-visit).  The importance of social distancing was discussed today.  Time:   Today, I have spent 8 minutes with the patient with telehealth technology discussing the above problems.     Medication Adjustments/Labs and Tests Ordered: Current medicines are reviewed at length with the patient today.  Concerns regarding medicines are outlined above.   Tests Ordered: No orders of the defined types were placed in this encounter.   Medication  Changes: No orders of the defined types were placed in this encounter.   Follow Up:  In Person in 3 week(s)  Signed, Christell Faith, PA-C  10/07/2020  8:48 AM    [facility]

## 2020-10-07 ENCOUNTER — Encounter: Payer: Self-pay | Admitting: Physician Assistant

## 2020-10-07 ENCOUNTER — Telehealth (INDEPENDENT_AMBULATORY_CARE_PROVIDER_SITE_OTHER): Payer: BC Managed Care – PPO | Admitting: Physician Assistant

## 2020-10-07 ENCOUNTER — Other Ambulatory Visit: Payer: Self-pay

## 2020-10-07 VITALS — Ht 67.0 in | Wt 202.0 lb

## 2020-10-07 DIAGNOSIS — I110 Hypertensive heart disease with heart failure: Secondary | ICD-10-CM

## 2020-10-07 DIAGNOSIS — N183 Chronic kidney disease, stage 3 unspecified: Secondary | ICD-10-CM

## 2020-10-07 DIAGNOSIS — I2510 Atherosclerotic heart disease of native coronary artery without angina pectoris: Secondary | ICD-10-CM | POA: Diagnosis not present

## 2020-10-07 DIAGNOSIS — I519 Heart disease, unspecified: Secondary | ICD-10-CM | POA: Diagnosis not present

## 2020-10-07 DIAGNOSIS — Z9861 Coronary angioplasty status: Secondary | ICD-10-CM

## 2020-10-07 DIAGNOSIS — I255 Ischemic cardiomyopathy: Secondary | ICD-10-CM | POA: Diagnosis not present

## 2020-10-07 DIAGNOSIS — E785 Hyperlipidemia, unspecified: Secondary | ICD-10-CM

## 2020-10-07 DIAGNOSIS — I5042 Chronic combined systolic (congestive) and diastolic (congestive) heart failure: Secondary | ICD-10-CM

## 2020-10-07 NOTE — Patient Instructions (Signed)
Medication Instructions:   Your physician recommends that you continue on your current medications as directed. Please refer to the Current Medication list given to you today.  *If you need a refill on your cardiac medications before your next appointment, please call your pharmacy*   Lab Work:  None  If you have labs (blood work) drawn today and your tests are completely normal, you will receive your results only by: Marland Kitchen MyChart Message (if you have MyChart) OR . A paper copy in the mail If you have any lab test that is abnormal or we need to change your treatment, we will call you to review the results.   Testing/Procedures:  None   Follow-Up: At [facility], you and your health needs are our priority.  As part of our continuing mission to provide you with exceptional heart care, we have created designated Provider Care Teams.  These Care Teams include your primary Cardiologist (physician) and Advanced Practice Providers (APPs -  Physician Assistants and Nurse Practitioners) who all work together to provide you with the care you need, when you need it.  We recommend signing up for the patient portal called "MyChart".  Sign up information is provided on this After Visit Summary.  MyChart is used to connect with patients for Virtual Visits (Telemedicine).  Patients are able to view lab/test results, encounter notes, upcoming appointments, etc.  Non-urgent messages can be sent to your provider as well.   To learn more about what you can do with MyChart, go to [email or web address].    Your next appointment:   3 week(s)    The format for your next appointment:   In Person  Provider:   You may see Nelva Bush, MD or one of the following Advanced Practice Providers on your designated Care Team:    Murray Hodgkins, NP  Christell Faith, PA-C  Marrianne Mood, PA-C  Cadence Kathlen Mody, [place]  Laurann Montana, NP    Other Instructions  *Please give [place] a call next week with  Blood Pressure readings.

## 2020-10-16 NOTE — Progress Notes (Deleted)
Cardiology Office Note    Date:  10/16/2020   ID:  Jeff Cobb, DOB 04-17-1971, MRN [id number]  PCP:  Luciana Axe, NP  Cardiologist:  Nelva Bush, MD  Electrophysiologist:  None   Chief Complaint: Follow up  History of Present Illness:   Jeff Cobb is a 50 y.o. 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  He was admitted to the hospital in 04/2015 with an anterior STEMI and underwent PCI/DES to the LAD at [facility].  He was subsequently noted to have an ischemic cardiomyopathy with an EF of 25% at that time.  Following PCI and medical management, echo in 2017, showed an EF of 30 to 35%.  He was seen in 2018 with plans to optimize evidence-based medical therapy though blood work at that time showed renal insufficiency leading to the holding of spironolactone and Entresto.  Following this, he was lost to follow-up until he was seen in 11/2018.  It was noted the previously recommended echo in 2018 was obtained in 09/2018 and showed an EF of less than 20%.  When he reestablished care in 11/2018, it was noted the above echo performed in 09/2018 was performed in the setting of the patient being off all of his medications for approximately 1 year and had only resumed them a day or 2 prior to the study.  It was recommended he continue beta-blocker, spironolactone, and Lasix with recommendation to escalate ACE inhibitor/ARB/Entresto, if possible, pending lab work.  He was not seen again in the office until 04/2020 with reported intermittent adherence to medications over the prior year and a half.  Echo was recommended though not completed at that time.  He was seen in early 08/2020 with concerns regarding medication adherence.  Echo was again rescheduled.  He was subsequently admitted to the hospital 12/13 through 12/15 with  volume overload and acute pulmonary edema in the context of medication nonadherence.  High-sensitivity troponin normal x2.  BNP 1859.  Echo on 09/03/2020 showed an EF of less than 20%, global hypokinesis, severely dilated LV cavity size, mild LVH, grade 2 diastolic dysfunction, mildly reduced RV systolic function with mildly enlarged RV cavity size, mild to moderately dilated left atrium, moderately dilated right atrium, mild mitral valve regurgitation, and moderately dilated pulmonary artery.  He was IV diuresed with symptom improvement.  He was discharged on GDMT.  He was seen in hospital follow-up in 09/12/2020 and reported adherence to medications and that he was feeling much better following his hospitalization.  At that time, losartan was titrated to 25 mg daily, with plans to further optimize GDMT as able followed by repeat echo in several months time to reassess LV systolic function.  He was most recently seen virtually on 10/07/2020 and was doing well from a cardiac perspective.  He reported adherence to all medications and was tolerating them without issues.  Reported his weight was stable around 222 pounds.  At that time was recommended he come in for an in person visit as we needed to trend his BP and renal function in an effort to escalate GDMT as tolerated.  ***   Labs independently reviewed: 08/2020 - BUN 22, SCr 1.44, potassium 4.1, HGB 14.5, PLT 318, magnesium 1.6 04/2020 - TC 315, TG 210, HDL 122, LDL 156, albumin 4.7, AST/ALT normal  Past Medical  History:  Diagnosis Date  . CAD S/P percutaneous coronary angioplasty 04/2015   a. 04/2015 Anterior STEMI/PCI @ [facility]: LM nl, mLAD 100% (3.0x15 Xience DES), LCx nl, OM1-3 nl, dRCA 50%, PDA nl.  . Chronic combined systolic and diastolic heart failure, NYHA class 2 ([place]) 04/2015   a. 12/2015 Echo: EF 30-35%; b. 09/2018 Echo: EF <20%, sev HK (was not taking meds for ~ 1 yr @ this time)  . Hyperlipidemia   . Hypertensive heart disease   .  Ischemic cardiomyopathy    a. 04/2015 Echo: EF 25%, b. 12/2015 Echo: EF 30-35%; c. 09/2018 Echo: EF <20% (not taking meds).  . Tobacco abuse     Past Surgical History:  Procedure Laterality Date  . CORONARY ANGIOPLASTY WITH STENT PLACEMENT Left 04/2015   @ [facility]: mLAD 100% --> 3.0 x 15 Xience DES  . TRANSTHORACIC ECHOCARDIOGRAM  04/2015   @ [facility]: EF 25%, LVH, trivial MR, dilated thoracic aorta, anteroapical and apical akinesis and severely decreased contraction overall  . TRANSTHORACIC ECHOCARDIOGRAM  12/2015   Echo: EF 30-35%, mod conc LVH, mid-apicalanteroseptal, anterior, and apical AK, mildly dil LA    Current Medications: No outpatient medications have been marked as taking for the 10/24/20 encounter (Appointment) with Rise Mu, PA-C.    Allergies:   Patient has no known allergies.   Social History   Socioeconomic History  . Marital status: Married    Spouse name: Not on file  . Number of children: Not on file  . Years of education: Not on file  . Highest education level: Not on file  Occupational History  . Occupation: unemployed  Tobacco Use  . Smoking status: Current Some Day Smoker    Packs/day: 0.10  . Smokeless tobacco: Never Used  Vaping Use  . Vaping Use: Never used  Substance and Sexual Activity  . Alcohol use: Yes    Alcohol/week: 2.0 standard drinks    Types: 2 Shots of liquor per week    Comment: daily  . Drug use: Yes    Types: Marijuana  . Sexual activity: Not on file  Other Topics Concern  . Not on file  Social History Narrative  . Not on file   Social Determinants of Health   Financial Resource Strain: Not on file  Food Insecurity: Not on file  Transportation Needs: Not on file  Physical Activity: Not on file  Stress: Not on file  Social Connections: Not on file     Family History:  The patient's family history includes Diabetes Mellitus II in his father; Heart attack in his father; Heart disease in his father.  ROS:    ROS   EKGs/Labs/Other Studies Reviewed:    Studies reviewed were summarized above. The additional studies were reviewed today:  2D echo 09/03/2020: 1. Left ventricular ejection fraction, by estimation, is <20%. The left  ventricle has severely decreased function. The left ventricle demonstrates  global hypokinesis. The left ventricular internal cavity size was severely  dilated. There is mild left  ventricular hypertrophy. Left ventricular diastolic parameters are  consistent with Grade II diastolic dysfunction (pseudonormalization).  Elevated left atrial pressure.  2. Right ventricular systolic function is mildly reduced. The right  ventricular size is mildly enlarged. Tricuspid regurgitation signal is  inadequate for assessing PA pressure.  3. Left atrial size was mild to moderately dilated.  4. Right atrial size was moderately dilated.  5. The mitral valve is grossly normal. Mild mitral valve regurgitation.  No evidence of mitral  stenosis.  6. The aortic valve is tricuspid. Aortic valve regurgitation is not  visualized. No aortic stenosis is present.  7. Moderately dilated pulmonary artery. __________  2D echo 09/2018: - Left ventricle: The cavity size was severely dilated. Systolic  function was severely reduced. The estimated ejection fraction  was less than 20% Severe global hypokinesis. Wall motion of basal  to mid inferior wall best preserved. The study is not technically  sufficient to allow evaluation of LV diastolic function.  - Mitral valve: There was mild regurgitation.  - Right ventricle: Systolic function was normal.  - Pulmonary arteries: Systolic pressure could not be accurately  estimated. __________  2D echo 06/2016: - Left ventricle: The cavity size was mildly dilated. Systolic  function was severely reduced. The estimated ejection fraction  was in the range of 25% to 30%. Severe anterior, anteroseptal and  apical wall  hypokinesis. Doppler parameters are consistent with  abnormal left ventricular relaxation (grade 1 diastolic  dysfunction).  - Left atrium: The atrium was normal in size.  - Right ventricle: Systolic function was normal.  - Pulmonary arteries: Systolic pressure was within the normal  range.   Impressions:   - Unable to exclude mural thrombus in the apical region. __________  2D echo 12/2015: - Left ventricle: The cavity size was moderately dilated. There was  moderate concentric hypertrophy. Systolic function was moderately  to severely reduced. The estimated ejection fraction was in the  range of 30% to 35%. Akinesis of the mid-apicalanteroseptal,  anterior, and apical myocardium. Left ventricular diastolic  function parameters were normal.  - Left atrium: The atrium was mildly dilated.   EKG:  EKG is ordered today.  The EKG ordered today demonstrates ***  Recent Labs: 05/09/2020: ALT 21 08/21/2020: Magnesium 1.6 09/02/2020: B Natriuretic Peptide 1,859.9 09/04/2020: Hemoglobin 14.5; Platelets 318 09/09/2020: BUN 22; Creatinine, Ser 1.44; Potassium 4.1; Sodium 140  Recent Lipid Panel    Component Value Date/Time   CHOL 315 (H) 05/09/2020 0927   TRIG 210 (H) 05/09/2020 0927   HDL 122 05/09/2020 0927   CHOLHDL 2.6 05/09/2020 0927   CHOLHDL NOT REPORTED DUE TO HIGH TRIGLYCERIDES 03/16/2017 0734   VLDL UNABLE TO CALCULATE IF TRIGLYCERIDE OVER 400 mg/dL 03/16/2017 0734   LDLCALC 156 (H) 05/09/2020 0927   LDLDIRECT 70 12/23/2016 0725    PHYSICAL EXAM:    VS:  There were no vitals taken for this visit.  BMI: There is no height or weight on file to calculate BMI.  Physical Exam  Wt Readings from Last 3 Encounters:  10/07/20 202 lb (91.6 kg)  09/12/20 200 lb (90.7 kg)  09/04/20 211 lb 3.2 oz (95.8 kg)     ASSESSMENT & PLAN:   1. CAD involving the native coronary arteries without***angina:  2. Chronic combined systolic and diastolic CHF/RV  dysfunction/ICM:  3. CKD stage III:  4. HLD: LDL 156 from 04/2020 with goal being less than 70.  He was resumed on atorvastatin in 08/2020.  5. History of tobacco use:  Disposition: F/u with Dr. Saunders Revel or an APP in ***.   Medication Adjustments/Labs and Tests Ordered: Current medicines are reviewed at length with the patient today.  Concerns regarding medicines are outlined above. Medication changes, Labs and Tests ordered today are summarized above and listed in the Patient Instructions accessible in Encounters.   Signed, Christell Faith, PA-C 10/16/2020 3:51 PM     [facility] [address] [place], [place] [address] [phone]

## 2020-10-24 ENCOUNTER — Telehealth: Payer: BC Managed Care – PPO | Admitting: Physician Assistant

## 2020-10-31 NOTE — Progress Notes (Addendum)
Office Visit    Patient Name: Jeff Cobb Date of Encounter: 11/01/2020  Primary Care Provider:  Luciana Axe, NP Primary Cardiologist:  Nelva Bush, MD Electrophysiologist:  None   Chief Complaint    Jeff Cobb is a 50 y.o. male with a hx of CAD s/p prior LAD stenting in 2016, ischemic cardiomyopathy with EF less than 20%, HFrEF, HTN, HLD presents today for follow up of heart failure.  Past Medical History    Past Medical History:  Diagnosis Date  . CAD S/P percutaneous coronary angioplasty 04/2015   a. 04/2015 Anterior STEMI/PCI @ [facility]: LM nl, mLAD 100% (3.0x15 Xience DES), LCx nl, OM1-3 nl, dRCA 50%, PDA nl.  . Chronic combined systolic and diastolic heart failure, NYHA class 2 ([place]) 04/2015   a. 12/2015 Echo: EF 30-35%; b. 09/2018 Echo: EF <20%, sev HK (was not taking meds for ~ 1 yr @ this time)  . Hyperlipidemia   . Hypertensive heart disease   . Ischemic cardiomyopathy    a. 04/2015 Echo: EF 25%, b. 12/2015 Echo: EF 30-35%; c. 09/2018 Echo: EF <20% (not taking meds).  . Tobacco abuse    Past Surgical History:  Procedure Laterality Date  . CORONARY ANGIOPLASTY WITH STENT PLACEMENT Left 04/2015   @ [facility]: mLAD 100% --> 3.0 x 15 Xience DES  . TRANSTHORACIC ECHOCARDIOGRAM  04/2015   @ [facility]: EF 25%, LVH, trivial MR, dilated thoracic aorta, anteroapical and apical akinesis and severely decreased contraction overall  . TRANSTHORACIC ECHOCARDIOGRAM  12/2015   Echo: EF 30-35%, mod conc LVH, mid-apicalanteroseptal, anterior, and apical AK, mildly dil LA    Allergies  No Known Allergies  History of Present Illness    Jeff Cobb is a 50 y.o. male with a hx of CAD s/p prior LAD stenting in 2016, ischemic cardiomyopathy with EF less than 20%, HFrEF, HTN, HLD.  He was last seen via phone visit 10/07/20.   He suffered an anterior STEMI August 2016 requiring DES to LAD at [facility].  Subsequently noted to have ischemic cardiomyopathy and HFrEF with EF 25% at the time.  Subsequent  echo in 2017 with EF 30-35%.  Seen in clinic 3/28 gene and follow-up blood work showed renal insufficiency and therefore Entresto and spironolactone were held.  He was then lost to follow-up.  Echo January 2020 with EF less than 20%.  He was seen in clinic 11/29/2018 noted to be off all of his medications for a year prior to that echocardiogram. His atorvastatin 80 mg, Coreg 25 mg twice daily, Plavix 75 mg, Zetia 10 mg, fenofibrate 145 mg, Lasix 40 mg, spironolactone 25 mg were continued.  Lab work was collected. His potassium was increased to 55mEq daily.  Seen in follow up 05/09/20 with reported intermittent compliance with his heart failure medications over the last year. It was very unclear where he was filling medications as previous [facility] had not filled in 2 years and [facility] had only filled Lasix. He had lab work with hypokalemia 2.7 recommended to hold Lasix and increase Potassium. He did not have repeat labs as recommended. Seen in follow up 05/09/20 still with difficulties with medication compliance.   He was subsequently hospitalized 09/03/11/15/21 due to acute pulmonary edema in the setting of volume overload and noncompliance with medications. He was IV diuresed and discharged on atorvastatin 80 mg, Coreg 25 mg twice daily, furosemide 40 mg daily, losartan 12.5 mg daily, spironolactone 12.5 mg daily.  Echocardiogram during admission pulses 4/21 with LVEF less  than 20%, LV severely dilated, mild LVH, grade 2 diastolic dysfunction, RV SF mildly reduced, RV mildly enlarged, LA mild to moderately dilated, RA moderately dilated, mild MR, moderate.  Seen in follow up 09/12/20. He reported improvement in dyspnea and no recurrent chest pain. For optimization of GDMT his Losartan was increased to 25mg  daily with goal to transition to [facility]. Phone visit 10/07/20 unable to transition to [facility] as no home BP readings though reported no new cardiac symptoms.   Presents today for follow up. Tells me his son  has a football scholarship to a college in [place] and they are going to visit this weekend. Reports no shortness of breath. Endorses stable mild dyspnea on exertion. Reports no chest pain, pressure, or tightness. No edema, orthopnea, PND. Reports no palpitations. He has been taking his Lasix twice daily though it is prescribed daily. Not monitoring BP at home. No lightheadedness, dizziness.   EKGs/Labs/Other Studies Reviewed:   The following studies were reviewed today:  2D echo 09/03/2020: 1. Left ventricular ejection fraction, by estimation, is <20%. The left  ventricle has severely decreased function. The left ventricle demonstrates  global hypokinesis. The left ventricular internal cavity size was severely  dilated. There is mild left  ventricular hypertrophy. Left ventricular diastolic parameters are  consistent with Grade II diastolic dysfunction (pseudonormalization).  Elevated left atrial pressure.   2. Right ventricular systolic function is mildly reduced. The right  ventricular size is mildly enlarged. Tricuspid regurgitation signal is  inadequate for assessing PA pressure.   3. Left atrial size was mild to moderately dilated.   4. Right atrial size was moderately dilated.   5. The mitral valve is grossly normal. Mild mitral valve regurgitation.  No evidence of mitral stenosis.   6. The aortic valve is tricuspid. Aortic valve regurgitation is not  visualized. No aortic stenosis is present.   7. Moderately dilated pulmonary artery. __________   2D echo 09/2018: - Left ventricle: The cavity size was severely dilated. Systolic    function was severely reduced. The estimated ejection fraction    was less than 20% Severe global hypokinesis. Wall motion of basal    to mid inferior wall best preserved. The study is not technically    sufficient to allow evaluation of LV diastolic function.  - Mitral valve: There was mild regurgitation.  - Right ventricle: Systolic function was  normal.  - Pulmonary arteries: Systolic pressure could not be accurately    estimated.  __________   2D echo 06/2016: - Left ventricle: The cavity size was mildly dilated. Systolic    function was severely reduced. The estimated ejection fraction    was in the range of 25% to 30%. Severe anterior, anteroseptal and    apical wall hypokinesis. Doppler parameters are consistent with    abnormal left ventricular relaxation (grade 1 diastolic    dysfunction).  - Left atrium: The atrium was normal in size.  - Right ventricle: Systolic function was normal.  - Pulmonary arteries: Systolic pressure was within the normal    range.   Impressions:   - Unable to exclude mural thrombus in the apical region. __________   2D echo 12/2015: - Left ventricle: The cavity size was moderately dilated. There was    moderate concentric hypertrophy. Systolic function was moderately    to severely reduced. The estimated ejection fraction was in the    range of 30% to 35%. Akinesis of the mid-apicalanteroseptal,    anterior, and apical myocardium. Left ventricular diastolic  function parameters were normal.  - Left atrium: The atrium was mildly dilated.  EKG: EKG is ordered today. The EKG performed today demonstrates NSR 93bpm with voltage criteria for LVH with prior inferior and anterolateral infarct. No acute St/T wave changes.   Recent Labs: 05/09/2020: ALT 21 08/21/2020: Magnesium 1.6 09/02/2020: B Natriuretic Peptide 1,859.9 09/04/2020: Hemoglobin 14.5; Platelets 318 09/09/2020: BUN 22; Creatinine, Ser 1.44; Potassium 4.1; Sodium 140  Recent Lipid Panel    Component Value Date/Time   CHOL 315 (H) 05/09/2020 0927   TRIG 210 (H) 05/09/2020 0927   HDL 122 05/09/2020 0927   CHOLHDL 2.6 05/09/2020 0927   CHOLHDL NOT REPORTED DUE TO HIGH TRIGLYCERIDES 03/16/2017 0734   VLDL UNABLE TO CALCULATE IF TRIGLYCERIDE OVER 400 mg/dL 03/16/2017 0734   LDLCALC 156 (H) 05/09/2020 0927   LDLDIRECT 70  12/23/2016 0725   Home Medications   Current Meds  Medication Sig  . acetaminophen (TYLENOL) 325 MG tablet Take 650 mg by mouth every 6 (six) hours as needed for mild pain, fever or headache.  Marland Kitchen aspirin EC 81 MG tablet Take 81 mg by mouth daily.  Marland Kitchen atorvastatin (LIPITOR) 80 MG tablet Take 1 tablet (80 mg total) by mouth daily.  . carvedilol (COREG) 25 MG tablet Take 1 tablet (25 mg total) by mouth 2 (two) times daily.  . cyanocobalamin 1000 MCG tablet Take 3,000 mcg by mouth daily.  Marland Kitchen ezetimibe (ZETIA) 10 MG tablet Take 10 mg by mouth daily.  . fenofibrate (TRICOR) 145 MG tablet Take 145 mg by mouth daily.  . Omega-3 Fatty Acids (FISH OIL PO) Take by mouth daily.  . sacubitril-valsartan (ENTRESTO) 24-26 MG Take 1 tablet by mouth 2 (two) times daily.  . [DISCONTINUED] furosemide (LASIX) 40 MG tablet Take 1 tablet (40 mg total) by mouth daily.  . [DISCONTINUED] losartan (COZAAR) 25 MG tablet Take 1 tablet (25 mg total) by mouth daily.  . [DISCONTINUED] spironolactone (ALDACTONE) 25 MG tablet Take 0.5 tablets (12.5 mg total) by mouth daily.    Review of Systems   All other systems reviewed and are otherwise negative except as noted above.  Physical Exam   VS:  BP 104/64 (BP Location: Left Arm, Patient Position: Sitting, Cuff Size: Normal)   Pulse 93   Ht 5\' 7"  (1.702 m)   Wt 205 lb (93 kg)   SpO2 98%   BMI 32.11 kg/m  , BMI Body mass index is 32.11 kg/m. GEN: Well nourished, well developed, in no acute distress. HEENT: normal. Neck: Supple, no JVD, carotid bruits, or masses. Cardiac: RRR, no murmurs, rubs, or gallops. No clubbing, cyanosis, edema.  Radials/PT 2+ and equal bilaterally.  Respiratory:  Respirations regular and unlabored, clear to auscultation bilaterally. GI: Soft, nontender, nondistended, BS + x 4. MS: No deformity or atrophy. Skin: Warm and dry, no rash. Neuro:  Strength and sensation are intact. Psych: Normal affect.  Assessment & Plan   1. CAD s/p prior  STEMI with LAD stenting in 2016 -  GDMT includes aspirin, beta-blocker, statin, PRN Nitroglycerin.  Denies anginal symptoms.  No indication for ischemic evaluation at this time.   2. HFrEF/ischemic cardiomyopathy -Euvolemic and well compensated on exam.    GDMT includes Coreg 25 mg BID, Lasix 40 mg BID, spironolactone 12.5 mg QD.  For optimization of GDMT, STOP Losartan, START Entresto 24-26mg  BID. Did discuss that if he begins to notice lightheadedness or dehydration will likely need to reduce Lasix to 40mg  QD instead of BID.  At next  follow-up if he is tolerating cardiac medications and compliant, consider addition of SGLT2i.  Prior to consideration for potential ICD for underlying ischemic cardiomyopathy will need to demonstrate medical adherence.  Plan for repeat echocardiogram approximately 3 months after initiation of Entresto reassessment of LVEF.  If LVEF remains less than 35% at that time, referred to EP.  BMP, BNP today.  3. HLD, LDL goal <70 - Lipid panel 05/09/2020 total cholesterol 315, triglycerides 210, HDL 122, LDL 156.  Atorvastatin 80 mg daily was resumed 08/2020.  Will need repeat lipid panel at follow up.   4. Tobacco/alcohol use-  Continued smoking cessation encouraged. Recommend utilization of [id number].  5. CKDIII -careful titration of diuretics and antihypertensives.  BMP today for monitoring.   Disposition:  Follow up  in 1 month(s)  with Dr. Saunders Revel or APP.   Loel Dubonnet, NP 11/01/2020, 1:28 PM

## 2020-11-01 ENCOUNTER — Encounter: Payer: Self-pay | Admitting: Family

## 2020-11-01 ENCOUNTER — Ambulatory Visit (INDEPENDENT_AMBULATORY_CARE_PROVIDER_SITE_OTHER): Payer: BC Managed Care – PPO | Admitting: Family

## 2020-11-01 ENCOUNTER — Other Ambulatory Visit: Payer: Self-pay

## 2020-11-01 VITALS — BP 104/64 | HR 93 | Ht 67.0 in | Wt 205.0 lb

## 2020-11-01 DIAGNOSIS — F17200 Nicotine dependence, unspecified, uncomplicated: Secondary | ICD-10-CM

## 2020-11-01 DIAGNOSIS — E785 Hyperlipidemia, unspecified: Secondary | ICD-10-CM | POA: Diagnosis not present

## 2020-11-01 DIAGNOSIS — I2510 Atherosclerotic heart disease of native coronary artery without angina pectoris: Secondary | ICD-10-CM | POA: Diagnosis not present

## 2020-11-01 DIAGNOSIS — I255 Ischemic cardiomyopathy: Secondary | ICD-10-CM | POA: Diagnosis not present

## 2020-11-01 DIAGNOSIS — N183 Chronic kidney disease, stage 3 unspecified: Secondary | ICD-10-CM

## 2020-11-01 DIAGNOSIS — I5042 Chronic combined systolic (congestive) and diastolic (congestive) heart failure: Secondary | ICD-10-CM | POA: Diagnosis not present

## 2020-11-01 DIAGNOSIS — Z9861 Coronary angioplasty status: Secondary | ICD-10-CM

## 2020-11-01 DIAGNOSIS — I519 Heart disease, unspecified: Secondary | ICD-10-CM

## 2020-11-01 MED ORDER — SPIRONOLACTONE 25 MG PO TABS: 12.5000 mg | ORAL_TABLET | Freq: Every day | ORAL | 5 refills | Status: DC

## 2020-11-01 MED ORDER — FUROSEMIDE 40 MG PO TABS: 40.0000 mg | ORAL_TABLET | Freq: Two times a day (BID) | ORAL | 11 refills | Status: DC

## 2020-11-01 MED ORDER — ENTRESTO 24-26 MG PO TABS: 1.0000 | ORAL_TABLET | Freq: Two times a day (BID) | ORAL | 5 refills | Status: DC

## 2020-11-01 NOTE — Patient Instructions (Addendum)
Medication Instructions:  Your provider has recommended you make the following change in your medication:   STOP Losartan  START Entresto 24-26mg  twice daily  *If you need a refill on your cardiac medications before your next appointment, please call your pharmacy*   Lab Work: Your provider recommends that you return for lab work today: BMP, BNP  If you have labs (blood work) drawn today and your tests are completely normal, you will receive your results only by: Marland Kitchen MyChart Message (if you have MyChart) OR . A paper copy in the mail If you have any lab test that is abnormal or we need to change your treatment, we will call you to review the results.  Testing/Procedures: Your EKG today was stable compared to previous.   Follow-Up: At [facility], you and your health needs are our priority.  As part of our continuing mission to provide you with exceptional heart care, we have created designated Provider Care Teams.  These Care Teams include your primary Cardiologist (physician) and Advanced Practice Providers (APPs -  Physician Assistants and Nurse Practitioners) who all work together to provide you with the care you need, when you need it.  We recommend signing up for the patient portal called "MyChart".  Sign up information is provided on this After Visit Summary.  MyChart is used to connect with patients for Virtual Visits (Telemedicine).  Patients are able to view lab/test results, encounter notes, upcoming appointments, etc.  Non-urgent messages can be sent to your provider as well.   To learn more about what you can do with MyChart, go to [email or web address].    Your next appointment:   1 month(s)  The format for your next appointment:   In Person  Provider:   You may see Nelva Bush, MD or one of the following Advanced Practice Providers on your designated Care Team:    Murray Hodgkins, NP  Christell Faith, PA-C  Marrianne Mood, PA-C  Cadence Rankin,  [place]  Laurann Montana, NP  Other Instructions   Recommend weighing yourself daily. If you gain 2 pounds overnight or 5 pounds in one week, recommend taking an extra dose of Lasix.   Sacubitril; Valsartan Oral Tablets What is this medicine? SACUBITRIL; VALSARTAN (sak UE bi tril; val SAR tan) is a combination of a neprilysin inhibitor and a an angiotensin II receptor blocker. It treats heart failure. This medicine may be used for other purposes; ask your health care provider or pharmacist if you have questions. COMMON BRAND NAME(S): Entresto What should I tell my health care provider before I take this medicine? They need to know if you have any of these conditions:  diabetes and take a medicine that contains aliskiren  high levels of potassium in the blood  kidney disease  liver disease  low blood pressure  an unusual or allergic reaction to sacubitril; valsartan, drugs called angiotensin converting enzyme (ACE) inhibitors, angiotensin II receptor blockers (ARBs), other medicines, foods, dyes, or preservatives  pregnant or trying to get pregnant  breast-feeding How should I use this medicine? Take this medicine by mouth. Take it as directed on the prescription label at the same time every day. You can take it with or without food. If it upsets your stomach, take it with food. Keep taking it unless your health care provider tells you to stop. Talk to your health care provider about the use of this drug in children. While it may be prescribed for children as young as 1 for selected conditions, precautions  do apply. Overdosage: If you think you have taken too much of this medicine contact a poison control center or emergency room at once. NOTE: This medicine is only for you. Do not share this medicine with others. What if I miss a dose? If you miss a dose, take it as soon as you can. If it is almost time for your next dose, take only that dose. Do not take double or extra  doses. What may interact with this medicine? Do not take this medicine with any of the following medicines:  aliskiren if you have diabetes  angiotensin-converting enzyme (ACE) inhibitors, like benazepril, captopril, enalapril, fosinopril, lisinopril, or ramipril  tranylcypromine This medicine may also interact with the following medicines:  angiotensin II receptor blockers (ARBs) like azilsartan, candesartan, eprosartan, irbesartan, losartan, olmesartan, telmisartan, or valsartan  celecoxib  lithium  NSAIDS, medicines for pain and inflammation, like ibuprofen or naproxen  potassium-sparing diuretics like amiloride, spironolactone, and triamterene  potassium supplements This list may not describe all possible interactions. Give your health care provider a list of all the medicines, herbs, non-prescription drugs, or dietary supplements you use. Also tell them if you smoke, drink alcohol, or use illegal drugs. Some items may interact with your medicine. What should I watch for while using this medicine? Tell your doctor or health care provider if your symptoms do not start to get better or if they get worse. Do not become pregnant while taking this medicine. Women should inform their health care provider if they wish to become pregnant or think they might be pregnant. There is a potential for serious harm to an unborn child. Talk to your health care provider for more information. You may get drowsy or dizzy. Do not drive, use machinery, or do anything that needs mental alertness until you know how this medicine affects you. Do not stand or sit up quickly, especially if you are an older patient. This reduces the risk of dizzy or fainting spells. Alcohol may interfere with the effects of this medicine. Avoid alcoholic drinks. Avoid salt substitutes unless you are told otherwise by your health care provider. What side effects may I notice from receiving this medicine? Side effects that you  should report to your doctor or health care provider as soon as possible:  allergic reactions (skin rash, itching or hives; swelling of the face, lips, or tongue)  high potassium levels (chest pain; fast, irregular heartbeat; muscle weakness)  kidney injury (trouble passing urine or change in the amount of urine)  low blood pressure (dizziness; feeling faint or lightheaded, falls; unusually weak or tired) Side effects that usually do not require medical attention (report to your doctor or health care provider if they continue or are bothersome):  cough This list may not describe all possible side effects. Call your doctor for medical advice about side effects. You may report side effects to FDA at 1-800-FDA-1088. Where should I keep my medicine? Keep out of the reach of children and pets. Store at room temperature between 20 and 25 degrees C (68 and 77 degrees F). Protect from moisture. Keep the container tightly closed. Get rid of any unused medicine after the expiration date. To get rid of medicines that are no longer needed or have expired:  Take the medicine to a take-back program. Check with your pharmacy or law enforcement to find a location.  If you cannot return the medicine, check the label or package insert to see if the medicine should be thrown out in the  garbage or flushed down the toilet. If you are not sure, ask your health care provider. If it is safe to put it in the trash, empty the medicine out of the container. Mix the medicine with cat litter, dirt, coffee grounds, or other unwanted substance. Seal the mixture in a bag or container. Put it in the trash. NOTE: This sheet is a summary. It may not cover all possible information. If you have questions about this medicine, talk to your doctor, pharmacist, or health care provider.  2021 Elsevier/Gold Standard (2019-11-13 11:23:32)

## 2020-11-02 LAB — BASIC METABOLIC PANEL
BUN/Creatinine Ratio: 14 (ref 9–20)
BUN: 23 mg/dL (ref 6–24)
CO2: 21 mmol/L (ref 20–29)
Calcium: 9.6 mg/dL (ref 8.7–10.2)
Chloride: 100 mmol/L (ref 96–106)
Creatinine, Ser: 1.68 mg/dL — ABNORMAL HIGH (ref 0.76–1.27)
GFR calc Af Amer: 55 mL/min/{1.73_m2} — ABNORMAL LOW (ref >59)
GFR calc non Af Amer: 48 mL/min/{1.73_m2} — ABNORMAL LOW (ref >59)
Glucose: 101 mg/dL — ABNORMAL HIGH (ref 65–99)
Potassium: 3.4 mmol/L — ABNORMAL LOW (ref 3.5–5.2)
Sodium: 139 mmol/L (ref 134–144)

## 2020-11-02 LAB — BRAIN NATRIURETIC PEPTIDE: BNP: 152.6 pg/mL — ABNORMAL HIGH (ref 0.0–100.0)

## 2020-11-04 ENCOUNTER — Telehealth: Payer: Self-pay | Admitting: *Deleted

## 2020-11-04 DIAGNOSIS — Z79899 Other long term (current) drug therapy: Secondary | ICD-10-CM

## 2020-11-04 DIAGNOSIS — I5042 Chronic combined systolic (congestive) and diastolic (congestive) heart failure: Secondary | ICD-10-CM

## 2020-11-04 DIAGNOSIS — E876 Hypokalemia: Secondary | ICD-10-CM

## 2020-11-04 MED ORDER — POTASSIUM CHLORIDE ER 10 MEQ PO TBCR: 10.0000 meq | EXTENDED_RELEASE_TABLET | Freq: Every day | ORAL | 3 refills | Status: DC

## 2020-11-04 MED ORDER — FUROSEMIDE 40 MG PO TABS: 40.0000 mg | ORAL_TABLET | Freq: Every day | ORAL | 11 refills | Status: DC

## 2020-11-04 NOTE — Telephone Encounter (Signed)
-----   Message from Loel Dubonnet, NP sent at 11/04/2020  9:54 AM EST ----- Kidney function mild decline compared to previous. Reduce Lasix 40mg  to once per day. Potassium mildly low, recommend Potassium 10 mEq daily. Repeat BMP in 1 week.

## 2020-11-04 NOTE — Telephone Encounter (Signed)
The patient has been notified of the result and verbalized understanding.  All questions (if any) were answered. He verbalized understanding to decrease furosemide to 40 mg daily and take potassium 10 mEq daily. Rx sent to pharmacy and med list updated. He is also aware to go to the [facility] this Friday, 2/18 for repeat BMET.

## 2020-11-13 ENCOUNTER — Other Ambulatory Visit: Payer: Self-pay | Admitting: *Deleted

## 2020-11-13 ENCOUNTER — Telehealth: Payer: Self-pay | Admitting: Family

## 2020-11-13 DIAGNOSIS — I5042 Chronic combined systolic (congestive) and diastolic (congestive) heart failure: Secondary | ICD-10-CM

## 2020-11-13 DIAGNOSIS — Z79899 Other long term (current) drug therapy: Secondary | ICD-10-CM

## 2020-11-13 DIAGNOSIS — E876 Hypokalemia: Secondary | ICD-10-CM

## 2020-11-13 MED ORDER — FUROSEMIDE 40 MG PO TABS: 40.0000 mg | ORAL_TABLET | Freq: Every day | ORAL | 0 refills | Status: DC

## 2020-11-13 MED ORDER — POTASSIUM CHLORIDE ER 10 MEQ PO TBCR: 10.0000 meq | EXTENDED_RELEASE_TABLET | Freq: Every day | ORAL | 0 refills | Status: DC

## 2020-11-13 MED ORDER — SPIRONOLACTONE 25 MG PO TABS: 12.5000 mg | ORAL_TABLET | Freq: Every day | ORAL | 0 refills | Status: DC

## 2020-11-13 MED ORDER — ENTRESTO 24-26 MG PO TABS: 1.0000 | ORAL_TABLET | Freq: Two times a day (BID) | ORAL | 0 refills | Status: DC

## 2020-11-13 NOTE — Telephone Encounter (Signed)
Requested Prescriptions   Signed Prescriptions Disp Refills  . furosemide (LASIX) 40 MG tablet 180 tablet 0    Sig: Take 1 tablet (40 mg total) by mouth daily.    Authorizing Provider: Loel Dubonnet    Ordering User: Eugenio Hoes, Alania Overholt C  . potassium chloride (KLOR-CON) 10 MEQ tablet 90 tablet 0    Sig: Take 1 tablet (10 mEq total) by mouth daily.    Authorizing Provider: Loel Dubonnet    Ordering User: Britt Bottom sacubitril-valsartan (ENTRESTO) 24-26 MG 180 tablet 0    Sig: Take 1 tablet by mouth 2 (two) times daily.    Authorizing Provider: Loel Dubonnet    Ordering User: Britt Bottom spironolactone (ALDACTONE) 25 MG tablet 45 tablet 0    Sig: Take 0.5 tablets (12.5 mg total) by mouth daily.    Authorizing Provider: Loel Dubonnet    Ordering User: Britt Bottom

## 2020-11-13 NOTE — Telephone Encounter (Signed)
*  STAT* If patient is at the pharmacy, call can be transferred to refill team.   1. Which medications need to be refilled? (please list name of each medication and dose if known) furosemide, potassium chloride, Entresto, spironolactone   2. Which pharmacy/location (including street and city if local pharmacy) is medication to be sent to? [facility] on [address]  3. Do they need a 30 day or 90 day supply? [place]

## 2020-11-13 NOTE — Telephone Encounter (Signed)
All requested medications have been sent to pt's local pharmacy requested for 90 days refill.

## 2020-12-04 ENCOUNTER — Ambulatory Visit: Payer: BC Managed Care – PPO | Admitting: Family

## 2021-01-17 ENCOUNTER — Other Ambulatory Visit: Payer: Self-pay | Admitting: Family

## 2021-01-17 NOTE — Telephone Encounter (Signed)
Rx request sent to pharmacy.  

## 2021-02-07 ENCOUNTER — Telehealth: Payer: Self-pay

## 2021-02-07 DIAGNOSIS — E785 Hyperlipidemia, unspecified: Secondary | ICD-10-CM

## 2021-02-07 DIAGNOSIS — E876 Hypokalemia: Secondary | ICD-10-CM

## 2021-02-07 DIAGNOSIS — Z79899 Other long term (current) drug therapy: Secondary | ICD-10-CM

## 2021-02-07 DIAGNOSIS — I5042 Chronic combined systolic (congestive) and diastolic (congestive) heart failure: Secondary | ICD-10-CM

## 2021-02-07 MED ORDER — FUROSEMIDE 40 MG PO TABS: 40.0000 mg | ORAL_TABLET | Freq: Every day | ORAL | 0 refills | Status: DC

## 2021-02-07 MED ORDER — POTASSIUM CHLORIDE ER 10 MEQ PO TBCR: 10.0000 meq | EXTENDED_RELEASE_TABLET | Freq: Every day | ORAL | 0 refills | Status: DC

## 2021-02-07 MED ORDER — EZETIMIBE 10 MG PO TABS: 10.0000 mg | ORAL_TABLET | Freq: Every day | ORAL | 0 refills | Status: DC

## 2021-02-07 MED ORDER — ATORVASTATIN CALCIUM 80 MG PO TABS: 80.0000 mg | ORAL_TABLET | Freq: Every day | ORAL | 0 refills | Status: DC

## 2021-02-07 MED ORDER — SPIRONOLACTONE 25 MG PO TABS: 12.5000 mg | ORAL_TABLET | Freq: Every day | ORAL | 0 refills | Status: DC

## 2021-02-07 MED ORDER — CARVEDILOL 25 MG PO TABS: 25.0000 mg | ORAL_TABLET | Freq: Two times a day (BID) | ORAL | 0 refills | Status: DC

## 2021-02-07 MED ORDER — ENTRESTO 24-26 MG PO TABS: 1.0000 | ORAL_TABLET | Freq: Two times a day (BID) | ORAL | 0 refills | Status: DC

## 2021-02-07 NOTE — Telephone Encounter (Signed)
*  STAT* If patient is at the pharmacy, call can be transferred to refill team.   1. Which medications need to be refilled? (please list name of each medication and dose if known) Patient is at work and unsure of the names of his medicines, but states all of his heart medications need refilling.  2. Which pharmacy/location (including street and city if local pharmacy) is medication to be sent to? [address]  3. Do they need a 30 day or 90 day supply? [place]

## 2021-02-07 NOTE — Telephone Encounter (Signed)
Patient needs to make a follow up appointment for any further refills needed.

## 2021-02-07 NOTE — Telephone Encounter (Signed)
Please contact patient for a follow up appointment. The patient is past due and is requesting refills on all medications.  Thanks, Ivin Booty

## 2021-02-07 NOTE — Telephone Encounter (Signed)
Please contact patient for a follow up. The patient is past due and is requesting refills on medications.  Thanks, Ivin Booty

## 2021-02-07 NOTE — Telephone Encounter (Signed)
Reviewed the patient's chart. He is scheduled to follow up on 02/18/21 with Christell Faith, PA.  All of his cardiac medications have been refilled for a #30 supply to help him get to this appointment.

## 2021-02-16 NOTE — Progress Notes (Deleted)
Cardiology Office Note    Date:  02/16/2021   ID:  Jeff Cobb, DOB Oct 19, 1970, MRN [id number]  PCP:  Jeff Axe, NP  Cardiologist:  Jeff Bush, MD  Electrophysiologist:  None   Chief Complaint: Follow up  History of Present Illness:   Jeff Cobb is a 51 y.o. 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  He was admitted to the hospital in 04/2015 with an anterior STEMI and underwent PCI/DES to the LAD at [facility].  He was subsequently noted to have an ischemic cardiomyopathy with an EF of 25% at that time.  Following PCI and medical management, echo in 2017, showed an EF of 30 to 35%.  He was seen in 2018 with plans to optimize evidence-based medical therapy though blood work at that time showed renal insufficiency leading to the holding of spironolactone and Entresto.  Following this, he was lost to follow-up until he was seen in 11/2018.  It was noted the previously recommended echo in 2018 was obtained in 09/2018 and showed an EF of less than 20%.  When he reestablished care in 11/2018, it was noted the above echo performed in 09/2018 was performed in the setting of the patient being off all of his medications for approximately 1 year and had only resumed them a day or 2 prior to the study.  It was recommended he continue beta-blocker, spironolactone, and Lasix with recommendation to escalate ACE inhibitor/ARB/Entresto, if possible, pending lab work.  He was not seen again in the office until 04/2020 with reported intermittent adherence to medications over the prior year and a half.  Echo was recommended though not completed at that time.  He was seen in early 08/2020 with concerns regarding medication adherence.  Echo was again rescheduled.  He was subsequently admitted to the hospital in  08/2020 with volume overload and acute pulmonary edema in the context of medication nonadherence.  High-sensitivity troponin normal x2.  BNP 1859.  Echo on 09/03/2020 showed an EF of less than 20%, global hypokinesis, severely dilated LV cavity size, mild LVH, grade 2 diastolic dysfunction, mildly reduced RV systolic function with mildly enlarged RV cavity size, mild to moderately dilated left atrium, moderately dilated right atrium, mild mitral valve regurgitation, and moderately dilated pulmonary artery.  He was IV diuresed with symptom improvement.  He was discharged on GDMT.  He was seen in hospital follow-up in 09/12/2020 and reported adherence to medications and that he was feeling much better following his hospitalization.  At that time, losartan was titrated to 25 mg daily, with plans to further optimize GDMT as able followed by repeat echo in several months time to reassess LV systolic function.  He was last seen in 10/2020 and was doing well and was transitioned to [place].  ***   Labs independently reviewed by me include: 10/2020 - BUN 23, SCr 1.68, potassium 3.4, BNP 152 08/2020 - HGB 14.5, PLT 318, magnesium 1.6 04/2020 - TC 315, TG 210, HDL 122, LDL 156, albumin 4.7, AST/ALT normal   Past Medical History:  Diagnosis Date  . CAD S/P percutaneous coronary angioplasty 04/2015   a. 04/2015 Anterior STEMI/PCI @ [facility]: LM nl, mLAD 100% (3.0x15 Xience DES), LCx nl, OM1-3 nl, dRCA 50%, PDA nl.  . Chronic combined systolic and diastolic heart failure, NYHA  class 2 ([place]) 04/2015   a. 12/2015 Echo: EF 30-35%; b. 09/2018 Echo: EF <20%, sev HK (was not taking meds for ~ 1 yr @ this time)  . Hyperlipidemia   . Hypertensive heart disease   . Ischemic cardiomyopathy    a. 04/2015 Echo: EF 25%, b. 12/2015 Echo: EF 30-35%; c. 09/2018 Echo: EF <20% (not taking meds).  . Tobacco abuse     Past Surgical History:  Procedure Laterality Date  . CORONARY ANGIOPLASTY WITH STENT PLACEMENT Left 04/2015   @  [facility]: mLAD 100% --> 3.0 x 15 Xience DES  . TRANSTHORACIC ECHOCARDIOGRAM  04/2015   @ [facility]: EF 25%, LVH, trivial MR, dilated thoracic aorta, anteroapical and apical akinesis and severely decreased contraction overall  . TRANSTHORACIC ECHOCARDIOGRAM  12/2015   Echo: EF 30-35%, mod conc LVH, mid-apicalanteroseptal, anterior, and apical AK, mildly dil LA    Current Medications: No outpatient medications have been marked as taking for the 02/18/21 encounter (Appointment) with Rise Mu, PA-C.    Allergies:   Patient has no known allergies.   Social History   Socioeconomic History  . Marital status: Married    Spouse name: Not on file  . Number of children: Not on file  . Years of education: Not on file  . Highest education level: Not on file  Occupational History  . Occupation: unemployed  Tobacco Use  . Smoking status: Current Some Day Smoker    Packs/day: 0.10  . Smokeless tobacco: Never Used  Vaping Use  . Vaping Use: Never used  Substance and Sexual Activity  . Alcohol use: Yes    Alcohol/week: 2.0 standard drinks    Types: 2 Shots of liquor per week    Comment: daily  . Drug use: Yes    Types: Marijuana  . Sexual activity: Not on file  Other Topics Concern  . Not on file  Social History Narrative  . Not on file   Social Determinants of Health   Financial Resource Strain: Not on file  Food Insecurity: Not on file  Transportation Needs: Not on file  Physical Activity: Not on file  Stress: Not on file  Social Connections: Not on file     Family History:  The patient's family history includes Diabetes Mellitus II in his father; Heart attack in his father; Heart disease in his father.  ROS:   ROS   EKGs/Labs/Other Studies Reviewed:    Studies reviewed were summarized above. The additional studies were reviewed today:  2D echo 09/03/2020: 1. Left ventricular ejection fraction, by estimation, is <20%. The left  ventricle has severely decreased function. The  left ventricle demonstrates  global hypokinesis. The left ventricular internal cavity size was severely  dilated. There is mild left  ventricular hypertrophy. Left ventricular diastolic parameters are  consistent with Grade II diastolic dysfunction (pseudonormalization).  Elevated left atrial pressure.  2. Right ventricular systolic function is mildly reduced. The right  ventricular size is mildly enlarged. Tricuspid regurgitation signal is  inadequate for assessing PA pressure.  3. Left atrial size was mild to moderately dilated.  4. Right atrial size was moderately dilated.  5. The mitral valve is grossly normal. Mild mitral valve regurgitation.  No evidence of mitral stenosis.  6. The aortic valve is tricuspid. Aortic valve regurgitation is not  visualized. No aortic stenosis is present.  7. Moderately dilated pulmonary artery. __________  2D echo 09/2018: - Left ventricle: The cavity size was severely dilated. Systolic  function was severely  reduced. The estimated ejection fraction  was less than 20% Severe global hypokinesis. Wall motion of basal  to mid inferior wall best preserved. The study is not technically  sufficient to allow evaluation of LV diastolic function.  - Mitral valve: There was mild regurgitation.  - Right ventricle: Systolic function was normal.  - Pulmonary arteries: Systolic pressure could not be accurately  estimated. __________  2D echo 06/2016: - Left ventricle: The cavity size was mildly dilated. Systolic  function was severely reduced. The estimated ejection fraction  was in the range of 25% to 30%. Severe anterior, anteroseptal and  apical wall hypokinesis. Doppler parameters are consistent with  abnormal left ventricular relaxation (grade 1 diastolic  dysfunction).  - Left atrium: The atrium was normal in size.  - Right ventricle: Systolic function was normal.  - Pulmonary arteries: Systolic pressure was within the  normal  range.   Impressions:   - Unable to exclude mural thrombus in the apical region. __________  2D echo 12/2015: - Left ventricle: The cavity size was moderately dilated. There was  moderate concentric hypertrophy. Systolic function was moderately  to severely reduced. The estimated ejection fraction was in the  range of 30% to 35%. Akinesis of the mid-apicalanteroseptal,  anterior, and apical myocardium. Left ventricular diastolic  function parameters were normal.  - Left atrium: The atrium was mildly dilated.   EKG:  EKG is ordered today.  The EKG ordered today demonstrates ***  Recent Labs: 05/09/2020: ALT 21 08/21/2020: Magnesium 1.6 09/04/2020: Hemoglobin 14.5; Platelets 318 11/01/2020: BNP 152.6; BUN 23; Creatinine, Ser 1.68; Potassium 3.4; Sodium 139  Recent Lipid Panel    Component Value Date/Time   CHOL 315 (H) 05/09/2020 0927   TRIG 210 (H) 05/09/2020 0927   HDL 122 05/09/2020 0927   CHOLHDL 2.6 05/09/2020 0927   CHOLHDL NOT REPORTED DUE TO HIGH TRIGLYCERIDES 03/16/2017 0734   VLDL UNABLE TO CALCULATE IF TRIGLYCERIDE OVER 400 mg/dL 03/16/2017 0734   LDLCALC 156 (H) 05/09/2020 0927   LDLDIRECT 70 12/23/2016 0725    PHYSICAL EXAM:    VS:  There were no vitals taken for this visit.  BMI: There is no height or weight on file to calculate BMI.  Physical Exam  Wt Readings from Last 3 Encounters:  11/01/20 205 lb (93 kg)  10/07/20 202 lb (91.6 kg)  09/12/20 200 lb (90.7 kg)     ASSESSMENT & PLAN:   1. Chronic combined systolic and diastolic CHF with RV dysfunction/ICM:  2. CAD involving the native coronary arteries without*** angina:  3. CKD stage III:  4. HTN: Blood pressure ***  5. HLD: LDL 156 in 04/2020 with goal being < 70.  He was resumed on Lipitor in 08/2020.  ***  6. Alcohol/tobacco use:  Disposition: F/u with Dr. Saunders Revel or an APP in ***.   Medication Adjustments/Labs and Tests Ordered: Current medicines are reviewed at length  with the patient today.  Concerns regarding medicines are outlined above. Medication changes, Labs and Tests ordered today are summarized above and listed in the Patient Instructions accessible in Encounters.   Signed, Christell Faith, PA-C 02/16/2021 7:40 PM     [address], [place] [address] [phone]

## 2021-02-18 ENCOUNTER — Ambulatory Visit: Payer: BC Managed Care – PPO | Admitting: Physician Assistant

## 2021-02-19 ENCOUNTER — Other Ambulatory Visit: Payer: Self-pay

## 2021-02-19 ENCOUNTER — Encounter: Payer: Self-pay | Admitting: Physician Assistant

## 2021-02-19 ENCOUNTER — Ambulatory Visit (INDEPENDENT_AMBULATORY_CARE_PROVIDER_SITE_OTHER): Payer: BC Managed Care – PPO | Admitting: Physician Assistant

## 2021-02-19 VITALS — BP 180/110 | HR 91 | Ht 67.0 in | Wt 207.0 lb

## 2021-02-19 DIAGNOSIS — E876 Hypokalemia: Secondary | ICD-10-CM

## 2021-02-19 DIAGNOSIS — Z720 Tobacco use: Secondary | ICD-10-CM

## 2021-02-19 DIAGNOSIS — I2510 Atherosclerotic heart disease of native coronary artery without angina pectoris: Secondary | ICD-10-CM

## 2021-02-19 DIAGNOSIS — N183 Chronic kidney disease, stage 3 unspecified: Secondary | ICD-10-CM

## 2021-02-19 DIAGNOSIS — I255 Ischemic cardiomyopathy: Secondary | ICD-10-CM

## 2021-02-19 DIAGNOSIS — Z79899 Other long term (current) drug therapy: Secondary | ICD-10-CM

## 2021-02-19 DIAGNOSIS — E785 Hyperlipidemia, unspecified: Secondary | ICD-10-CM | POA: Diagnosis not present

## 2021-02-19 DIAGNOSIS — I110 Hypertensive heart disease with heart failure: Secondary | ICD-10-CM

## 2021-02-19 DIAGNOSIS — Z9861 Coronary angioplasty status: Secondary | ICD-10-CM

## 2021-02-19 DIAGNOSIS — I5042 Chronic combined systolic (congestive) and diastolic (congestive) heart failure: Secondary | ICD-10-CM | POA: Diagnosis not present

## 2021-02-19 DIAGNOSIS — I252 Old myocardial infarction: Secondary | ICD-10-CM

## 2021-02-19 DIAGNOSIS — E781 Pure hyperglyceridemia: Secondary | ICD-10-CM

## 2021-02-19 MED ORDER — POTASSIUM CHLORIDE ER 10 MEQ PO TBCR: 10.0000 meq | EXTENDED_RELEASE_TABLET | Freq: Every day | ORAL | 2 refills | Status: DC

## 2021-02-19 MED ORDER — SPIRONOLACTONE 25 MG PO TABS: 12.5000 mg | ORAL_TABLET | Freq: Every day | ORAL | 2 refills | Status: DC

## 2021-02-19 MED ORDER — CARVEDILOL 25 MG PO TABS: 37.5000 mg | ORAL_TABLET | Freq: Two times a day (BID) | ORAL | 2 refills | Status: DC

## 2021-02-19 MED ORDER — LOSARTAN POTASSIUM 50 MG PO TABS: 50.0000 mg | ORAL_TABLET | Freq: Every day | ORAL | 2 refills | Status: DC

## 2021-02-19 MED ORDER — FUROSEMIDE 40 MG PO TABS: 40.0000 mg | ORAL_TABLET | Freq: Every day | ORAL | 2 refills | Status: DC

## 2021-02-19 NOTE — Patient Instructions (Signed)
Medication Instructions:  Your physician has recommended you make the following change in your medication:   INCREASE Carvedilol (coreg) to 37.5 mg (1 and 1/2 tablet) twice a day.  START Losartan 50 mg daily. An Rx has been sent to your pharmacy.  You cardiac medications have bee refilled today.  *If you need a refill on your cardiac medications before your next appointment, please call your pharmacy*   Lab Work: Cmp, Lipid today  Your physician recommends that you return for lab work in: 2 week (Bmp)  If you have labs (blood work) drawn today and your tests are completely normal, you will receive your results only by: Marland Kitchen MyChart Message (if you have MyChart) OR . A paper copy in the mail If you have any lab test that is abnormal or we need to change your treatment, we will call you to review the results.   Testing/Procedures: None ordered   Follow-Up: At [facility], you and your health needs are our priority.  As part of our continuing mission to provide you with exceptional heart care, we have created designated Provider Care Teams.  These Care Teams include your primary Cardiologist (physician) and Advanced Practice Providers (APPs -  Physician Assistants and Nurse Practitioners) who all work together to provide you with the care you need, when you need it.  We recommend signing up for the patient portal called "MyChart".  Sign up information is provided on this After Visit Summary.  MyChart is used to connect with patients for Virtual Visits (Telemedicine).  Patients are able to view lab/test results, encounter notes, upcoming appointments, etc.  Non-urgent messages can be sent to your provider as well.   To learn more about what you can do with MyChart, go to [email or web address].    Your next appointment:   2 week(s)  The format for your next appointment:   In Person  Provider:   You may see Nelva Bush, MD or one of the following Advanced Practice Providers on  your designated Care Team:    Murray Hodgkins, NP  Christell Faith, PA-C  Marrianne Mood, PA-C  Cadence [place], [place]  Laurann Montana, NP    Other Instructions N/A

## 2021-02-19 NOTE — Progress Notes (Addendum)
Office Visit    Patient Name: Jeff Cobb Date of Encounter: 02/19/2021  PCP:  Loura Pardon, NP   [facility]  Cardiologist:  Yvonne Kendall, MD  Advanced Practice Provider:  No care team member to display Electrophysiologist:  None  :[phone]}   Chief Complaint    Chief Complaint  Patient presents with  . other    Past due 1 month follow up. Meds reviewed verbally with patient.      50 y.o. male with history of CAD s/p prior LAD stenting in 2016, ischemic cardiomyopathy with EF less than 20%, HFrEF, hypertension, hyperlipidemia, and who presents today for 43mo  follow-up.  Past Medical History    Past Medical History:  Diagnosis Date  . CAD S/P percutaneous coronary angioplasty 04/2015   a. 04/2015 Anterior STEMI/PCI @ [facility]: LM nl, mLAD 100% (3.0x15 Xience DES), LCx nl, OM1-3 nl, dRCA 50%, PDA nl.  . Chronic combined systolic and diastolic heart failure, NYHA class 2 (HCC) 04/2015   a. 12/2015 Echo: EF 30-35%; b. 09/2018 Echo: EF <20%, sev HK (was not taking meds for ~ 1 yr @ this time)  . Hyperlipidemia   . Hypertensive heart disease   . Ischemic cardiomyopathy    a. 04/2015 Echo: EF 25%, b. 12/2015 Echo: EF 30-35%; c. 09/2018 Echo: EF <20% (not taking meds).  . Tobacco abuse    Past Surgical History:  Procedure Laterality Date  . CORONARY ANGIOPLASTY WITH STENT PLACEMENT Left 04/2015   @ [facility]: mLAD 100% --> 3.0 x 15 Xience DES  . TRANSTHORACIC ECHOCARDIOGRAM  04/2015   @ [facility]: EF 25%, LVH, trivial MR, dilated thoracic aorta, anteroapical and apical akinesis and severely decreased contraction overall  . TRANSTHORACIC ECHOCARDIOGRAM  12/2015   Echo: EF 30-35%, mod conc LVH, mid-apicalanteroseptal, anterior, and apical AK, mildly dil LA    Allergies  No Known Allergies  History of Present Illness    Jeff Cobb is a 50 y.o. male with PMH as above.  He suffered an anterior STEMI 04/2015, requiring DES to the LAD at [facility].  He was subsequently  noted to have ischemic cardiomyopathy with EF 25% at that time.  2017 echo EF 30 to 35%.  Subsequent follow-up blood work showed renal insufficiency with Entresto and spironolactone held.  He was then lost to follow-up.    09/2018 EF less than 20%.  He was seen in clinic 11/2018 and was noted to be off of all of his medications prior to that echo.    Seen 04/2020 with intermittent compliance with his heart failure medications over the last year.  It was unclear if he was filling his medications at [facility] versus another pharmacy.  Lab work showed hypokalemia; however, he did not obtain follow-up labs as recommended.  Seen at subsequent office visit with ongoing medication noncompliance.    He was admitted 09/02/2120 -09/04/2020 due to acute pulmonary edema in the setting of volume overload and medication noncompliance.  He was successfully IV diuresed and discharged on atorvastatin 80 mg, carvedilol 25 mg twice daily, furosemide 40 mg daily, losartan 12.5 mg daily, spironolactone 12.5 mg daily.  Echo during admission showed LVEF less than 20%, severe LVE, mild LVH, G2 DD, RVSF mildly reduced, mild RVE, mild to moderate LAE, moderate RAE, mild MR.  When seen at follow-up in the clinic, he reported improvement in dyspnea and denied recurrent chest pain.  Losartan was increased to 25 mg daily with plan to transition to [facility].  He was unable to transition at subsequent phone visit, given no home BP readings recorded.    He was last seen in office 11/01/2020.  He reported that his son had a football scholarship to college in [place], and he was going to go visit it this weekend.  He reported stable mild dyspnea on exertion.  He was taking his Lasix twice daily, although it was prescribed daily.  He was not monitoring his blood pressure at home.  He was started on Entresto 24-26 mg twice daily.  It was noted that, if he felt lightheaded or dehydration, he would need to reduce to Lasix 40 mg daily (rather than taking  it twice daily).  SGLT2 inhibitor could be considered at RTC.  Prior to consideration for ICD, medication adherence would need to be confirmed.  Repeat echo was recommended in approximately 3 months after initiation of Entresto.  Labs were obtained.  Tobacco and alcohol use reported with recommendation for complete cessation.  Follow-up labs showed kidney function with slight bump and recommendation to reduce to Lasix 40 mg daily.  Potassium was low with recommendation for KCl tab 10 M EQ daily and repeat BMET in 1 week.  Repeat labs were ordered but not obtained by patient.  Today, 02/19/2021, he returns to clinic and notes that he is doing about the same as prior clinic visits.  He has stopped Entresto, as he reported dizziness on this medication.  He does not recall his blood pressure when dizzy on the medication and is uncertain if it was low at that time.  He reports feeling better once stopping his Entresto and that his dizziness lasted until lunchtime.  He was only taking his Entresto once per day at that time.  He denies any chest pain at rest or with exertion.  No shortness of breath or dyspnea on exertion.  No orthopnea, lower extremity edema, or abdominal distention.  No signs or symptoms of bleeding.  Diet reviewed with patient reporting that he is working on his diet but eats out a lot and has a lot of previous packaged food/burgers.  CHF education provided both during my visit and with Dr. Otelia Sergeant nurse, visiting today.  He does not regularly monitor his blood pressure or weight at home.  He reports sometimes taking an additional Lasix 40 mg daily, estimated at occurring 2-3 times per week.  He requires refills for all of his medications today.  Of note, today he has 2 open wounds on his right shin with Band-Aids and alcohol provided, as the wounds are healing onto his knee-high socks.  He reports sustaining these wounds recently due to a game of basketball, during which time he scuffed his shin  on concrete.  It was recommended that he make sure that he keeps this wound clean and dry, given the depth of the wounds, and given concern for infection if the wound remains uncovered and unclaimed.  Initial BP today significantly elevated at 180/110 (previously 104/64 at last clinic visit) with repeat BP 154/90 by the end of his clinic visit.  Weight today 207 pounds (previously 205 pounds).  Heart rate 91 bpm.  Home Medications   Current Outpatient Medications  Medication Instructions  . acetaminophen (TYLENOL) 650 mg, Oral, Every 6 hours PRN  . aspirin EC 81 mg, Oral, Daily  . atorvastatin (LIPITOR) 80 mg, Oral, Daily  . carvedilol (COREG) 37.5 mg, Oral, 2 times daily  . cyanocobalamin 3,000 mcg, Oral, Daily  . ezetimibe (ZETIA) 10 mg, Oral,  Daily  . fenofibrate (TRICOR) 145 MG tablet TAKE 1 TABLET(145 MG) BY MOUTH DAILY  . furosemide (LASIX) 40 mg, Oral, Daily  . losartan (COZAAR) 50 mg, Oral, Daily  . Omega-3 Fatty Acids (FISH OIL PO) Oral, Daily  . potassium chloride (KLOR-CON) 10 MEQ tablet 10 mEq, Oral, Daily  . spironolactone (ALDACTONE) 12.5 mg, Oral, Daily     Review of Systems    He denies chest pain, palpitations, dyspnea, pnd, orthopnea, n, v, dizziness, syncope, edema, weight gain, or early satiety.   All other systems reviewed and are otherwise negative except as noted above.  Physical Exam    VS:  BP (!) 180/110 (BP Location: Left Arm, Patient Position: Sitting, Cuff Size: Normal)   Pulse 91   Ht $R'5\' 7"'tl$  (1.702 m)   Wt 207 lb (93.9 kg)   SpO2 98%   BMI 32.42 kg/m  , BMI Body mass index is 32.42 kg/m. GEN: Well nourished, well developed, in no acute distress. HEENT: normal. Neck: Supple, no JVD, carotid bruits, or masses. Cardiac: RRR, no murmurs, rubs, or gallops. No clubbing, cyanosis, edema.  Radials/DP/PT 2+ and equal bilaterally.  Respiratory:  Respirations regular and unlabored, clear to auscultation bilaterally. GI: Soft, nontender, nondistended, BS + x  4. MS: no deformity or atrophy.  Right lower extremity with 2 open wounds on his shin (alcohol pads and Band-Aid provided for patient). Skin: warm and dry, no rash. Neuro:  Strength and sensation are intact. Psych: Normal affect.  Accessory Clinical Findings    ECG personally reviewed by me today -NSR, 91 bpm, LAE, poor R wave progression in the inferior leads/prior inferior infarct, poor R wave progression V3 to V6/prior anterolateral infarct, QRS 94 ms, QTC 413 ms, left axis deviation, LVH and repolarization abnormalities- no acute changes.  VITALS Reviewed today   Temp Readings from Last 3 Encounters:  09/04/20 98.4 F (36.9 C) (Oral)  02/04/16 98.9 F (37.2 C) (Oral)  12/26/15 98.3 F (36.8 C) (Oral)   BP Readings from Last 3 Encounters:  02/19/21 (!) 180/110  11/01/20 104/64  09/12/20 122/86   Pulse Readings from Last 3 Encounters:  02/19/21 91  11/01/20 93  09/12/20 86    Wt Readings from Last 3 Encounters:  02/19/21 207 lb (93.9 kg)  11/01/20 205 lb (93 kg)  10/07/20 202 lb (91.6 kg)     LABS  reviewed today   Lab Results  Component Value Date   WBC 11.1 (H) 09/04/2020   HGB 14.5 09/04/2020   HCT 39.8 09/04/2020   MCV 89.4 09/04/2020   PLT 318 09/04/2020   Lab Results  Component Value Date   CREATININE 1.68 (H) 11/01/2020   BUN 23 11/01/2020   NA 139 11/01/2020   K 3.4 (L) 11/01/2020   CL 100 11/01/2020   CO2 21 11/01/2020   Lab Results  Component Value Date   ALT 21 05/09/2020   AST 25 05/09/2020   ALKPHOS 62 05/09/2020   BILITOT 2.3 (H) 05/09/2020   Lab Results  Component Value Date   CHOL 315 (H) 05/09/2020   HDL 122 05/09/2020   LDLCALC 156 (H) 05/09/2020   LDLDIRECT 70 12/23/2016   TRIG 210 (H) 05/09/2020   CHOLHDL 2.6 05/09/2020    No results found for: HGBA1C No results found for: TSH   STUDIES/PROCEDURES reviewed today    2D echo 09/03/2020: 1. Left ventricular ejection fraction, by estimation, is <20%. The left  ventricle  has severely decreased function. The left ventricle demonstrates  global hypokinesis. The left ventricular internal cavity size was severely  dilated. There is mild left  ventricular hypertrophy. Left ventricular diastolic parameters are  consistent with Grade II diastolic dysfunction (pseudonormalization).  Elevated left atrial pressure.  2. Right ventricular systolic function is mildly reduced. The right  ventricular size is mildly enlarged. Tricuspid regurgitation signal is  inadequate for assessing PA pressure.  3. Left atrial size was mild to moderately dilated.  4. Right atrial size was moderately dilated.  5. The mitral valve is grossly normal. Mild mitral valve regurgitation.  No evidence of mitral stenosis.  6. The aortic valve is tricuspid. Aortic valve regurgitation is not  visualized. No aortic stenosis is present.  7. Moderately dilated pulmonary artery. __________  2D echo 09/2018: - Left ventricle: The cavity size was severely dilated. Systolic  function was severely reduced. The estimated ejection fraction  was less than 20% Severe global hypokinesis. Wall motion of basal  to mid inferior wall best preserved. The study is not technically  sufficient to allow evaluation of LV diastolic function.  - Mitral valve: There was mild regurgitation.  - Right ventricle: Systolic function was normal.  - Pulmonary arteries: Systolic pressure could not be accurately  estimated. __________  2D echo 06/2016: - Left ventricle: The cavity size was mildly dilated. Systolic  function was severely reduced. The estimated ejection fraction  was in the range of 25% to 30%. Severe anterior, anteroseptal and  apical wall hypokinesis. Doppler parameters are consistent with  abnormal left ventricular relaxation (grade 1 diastolic  dysfunction).  - Left atrium: The atrium was normal in size.  - Right ventricle: Systolic function was normal.  - Pulmonary  arteries: Systolic pressure was within the normal  range.   Impressions:   - Unable to exclude mural thrombus in the apical region. __________  2D echo 12/2015: - Left ventricle: The cavity size was moderately dilated. There was  moderate concentric hypertrophy. Systolic function was moderately  to severely reduced. The estimated ejection fraction was in the  range of 30% to 35%. Akinesis of the mid-apicalanteroseptal,  anterior, and apical myocardium. Left ventricular diastolic  function parameters were normal.  - Left atrium: The atrium was mildly dilated.  Assessment & Plan    Coronary artery disease s/p prior STEMI with LAD stenting in 2016 -- Denies any symptoms consistent with angina.  Is able to participate in basketball without any shortness of breath or chest pain.  Continue GDMT with ASA, beta-blocker, statin, and as needed nitro.  Increased to carvedilol 37.5 mg twice daily today given his elevated heart rate and blood pressure and as below.  No indication for repeat ischemic evaluation at this time.  Discussed importance of medication compliance, as well as lifestyle changes with patient understanding.  HFrEF/ischemic cardiomyopathy - Denies any dyspnea or shortness of breath.  Euvolemic and well compensated on exam.  Medication compliance has been an issue in the past, as well as follow-up labs.  At his last clinic visit, his losartan was discontinued and patient started on Entresto 24-26 mg twice daily.  At that time, it was noted he should contact the office if he noted any dizziness, at which time they would reduce the dose of Lasix.  He has since discontinued this Entresto due to dizziness on his own in between visits.  His labs showed a bump in renal function and low potassium with recommendation to decrease back down to Lasix 20 mg daily with potassium supplementation and repeat labs.  Unfortunately,  repeat labs were not obtained.  Given his dizziness on  Entresto, he is hesitant to restart this medication today.  We did discuss that some of his dizziness may be 2/2 getting used to a more controlled BP.  After further discussion, he is agreeable to restart losartan and increased to 50 mg daily today with CMET today and in 2 weeks.  Given his elevated heart rate and BP today, we will also increase his carvedilol to 37.5 mg twice daily.  Continue Lasix 40 mg daily and KCl 56mEq daily. Continue spironolactone 12.5 mg daily.  At RTC, consider transition to Entresto/addition of SGLT2 inhibitor as renal function and BP allows.  3-6 mo following optimization of medical management, repeat echocardiogram.  If LVEF remains less than 35% at that time, refer to EP.  Once able to confirm medication compliance, and if EF remains below 35%, consider ICD.  As above, he did receive CHF education today from the advanced heart failure clinic nurse that was visiting same day.  Essential hypertension, goal BP 130/80 or lower -- BP poorly controlled at 180/110.  Previous clinic visit with BP 104/64.  Repeat BP today at end of visit improved.  He reports taking furosemide 40 mg daily, though he does take an additional 40 mg 2-3 times per week.  Suspect that salt may be playing a role in his elevated blood pressure, as well as discontinuation of Entresto 24-26 mg twice daily between clinic visits.  Also suspect some medication and diet noncompliance is influencing his elevated BP today.  Salt and fluid restrictions reviewed with total daily intake under 2 L/day and total salt intake under 2 g/day recommended.  Recommend monitor BP and fluid at home and bring log/BP cuff into his next clinic visit.  Discussed proper measurement techniques/weight techniques.  Hyperlipidemia, LDL goal less than 70 Hypertriglyceridemia -- Previous 04/2020 total cholesterol 315, triglycerides 210, HDL 122, and LDL 156.  We discussed this today, as well as the goal LDL of below 70 and reduction of  triglycerides.  Will repeat lipid and liver function today.  Continue atorvastatin 80 mg daily, Zetia $RemoveBefo'10mg'[id number]$  daily.  Continue fenofibrate 145 mg daily.  If LDL is still uncontrolled, discuss PCSK9 inhibitors once compliance confirmed with medications.  He does report family history of high cholesterol /possible familial hyperlipidemia.  Tobacco/alcohol use -- Complete cessation recommended.  CKD 3 -- As previously noted, careful titration of diuretics.  Caution with nephrotoxins.  Ensure electrolytes at goal.  Will obtain repeat c-Met today to ensure stable renal function and electrolytes, given previous labs showed hypokalemia with recommendation for repeat labs not obtained.  In addition, he has been taking an additional Lasix 2-3 times per week with previous recommendation to only take Lasix 40 mg once daily given a bump in renal function and decreased potassium on his last labs.  Glycemic and BP control recommended for cardiovascular and renal health.  Medication noncompliance -- Stressed compliance with medications and diet/lifestyle changes.  Stressed compliance for follow-up labs.  Discussed importance of monitoring BP and weight at home.  We will provide refills for medications.  *Work note provided for his employer today to verify that he did have a clinic visit --------------------------------------------------------  Medication changes:  --Restart losartan, increased to 50 mg daily --Increase to carvedilol 37.5 mg twice daily Labs ordered:  -- BMET today and in 2 weeks (restart losartan, transition to Entresto at RTC) -- Lipids, liver function (ensure optimized cholesterol medication) Studies / Imaging ordered:  -- None  Future considerations:  -- BMET at RTC  -- PCSK9 inhibitor if LDL not well controlled on repeat labs and medication compliance confirmed -- Transition back to Entresto 24-26 mg twice daily at RTC -- Addition of SGLT2 inhibitor -- Repeat echo 3 to 6 months following  optimization of medical management -- Referral to EP if EF remains below 35%, medication compliance confirmed -- Consideration of ICD if LVEF below 35% and medication compliance confirmed Disposition:  -- RTC 2 weeks  *Please be aware that the above documentation was completed voice recognition software and may contain dictation errors.    Arvil Chaco, PA-C 02/19/2021

## 2021-02-20 LAB — COMPREHENSIVE METABOLIC PANEL
ALT: 16 IU/L (ref 0–44)
AST: 31 IU/L (ref 0–40)
Albumin/Globulin Ratio: 1.6 (ref 1.2–2.2)
Albumin: 4.5 g/dL (ref 4.0–5.0)
Alkaline Phosphatase: 37 IU/L — ABNORMAL LOW (ref 44–121)
BUN/Creatinine Ratio: 10 (ref 9–20)
BUN: 16 mg/dL (ref 6–24)
Bilirubin Total: 0.6 mg/dL (ref 0.0–1.2)
CO2: 21 mmol/L (ref 20–29)
Calcium: 9.7 mg/dL (ref 8.7–10.2)
Chloride: 103 mmol/L (ref 96–106)
Creatinine, Ser: 1.54 mg/dL — ABNORMAL HIGH (ref 0.76–1.27)
Globulin, Total: 2.8 g/dL (ref 1.5–4.5)
Glucose: 83 mg/dL (ref 65–99)
Potassium: 4.1 mmol/L (ref 3.5–5.2)
Sodium: 142 mmol/L (ref 134–144)
Total Protein: 7.3 g/dL (ref 6.0–8.5)
eGFR: 55 mL/min/{1.73_m2} — ABNORMAL LOW (ref >59)

## 2021-02-20 LAB — LIPID PANEL
Chol/HDL Ratio: 2.8 ratio (ref 0.0–5.0)
Cholesterol, Total: 215 mg/dL — ABNORMAL HIGH (ref 100–199)
HDL: 78 mg/dL (ref >39)
LDL Chol Calc (NIH): 115 mg/dL — ABNORMAL HIGH (ref 0–99)
Triglycerides: 125 mg/dL (ref 0–149)
VLDL Cholesterol Cal: 22 mg/dL (ref 5–40)

## 2021-02-21 ENCOUNTER — Telehealth: Payer: Self-pay | Admitting: Physician Assistant

## 2021-02-21 NOTE — Telephone Encounter (Signed)
Arvil Chaco, PA-C  02/21/2021 10:07 AM EDT      Labs show --Total cholesterol elevated at 215 --Triglycerides WNL --HDL (good cholesterol) 78 and WNL --LDL (bad cholesterol) elevated at 115 with goal LDL 70 --Renal function WNL when compared with that of previous  Recommendations:  --Start PCSK9 inhibitor with Repatha or Praluent, depending on which is covered by his insurance. --Repeat lipid and liver function in 6 to 8 weeks.

## 2021-02-21 NOTE — Telephone Encounter (Signed)
Attempted to call the patient. No answer- no voice mail box set up

## 2021-02-25 NOTE — Telephone Encounter (Signed)
-----   Message from Arvil Chaco, PA-C sent at 02/21/2021 10:07 AM EDT ----- Labs show --Total cholesterol elevated at 215 --Triglycerides WNL --HDL (good cholesterol) 78 and WNL --LDL (bad cholesterol) elevated at 115 with goal LDL 70 --Renal function WNL when compared with that of previous  Recommendations:  --Start PCSK9 inhibitor with Repatha or Praluent, depending on which is covered by his insurance. --Repeat lipid and liver function in 6 to 8 weeks.

## 2021-02-25 NOTE — Telephone Encounter (Addendum)
Patient made aware of lab results and Marrianne Mood, PAs recommendation.  Patient sts that he takes his current cholesterol medications as prescribed and he does not miss a dose. Patient sts that he will need to think about it and discuss starting the recommend PSK9 with his wife. He will call back to provide his decision. Provided the patient both names of the medications, Monroe and Praluent. Adv him that they are self injected every 2 weeks.  Patient verbalized understanding and voiced appreciation for the call. Patient sts that he will call back once he has made a decision.

## 2021-03-04 ENCOUNTER — Telehealth: Payer: Self-pay

## 2021-03-04 ENCOUNTER — Encounter: Payer: Self-pay | Admitting: Physician Assistant

## 2021-03-04 ENCOUNTER — Ambulatory Visit (INDEPENDENT_AMBULATORY_CARE_PROVIDER_SITE_OTHER): Payer: BC Managed Care – PPO | Admitting: Physician Assistant

## 2021-03-04 ENCOUNTER — Other Ambulatory Visit: Payer: Self-pay

## 2021-03-04 VITALS — BP 146/118 | HR 91 | Ht 67.0 in | Wt 205.0 lb

## 2021-03-04 DIAGNOSIS — Z91148 Patient's other noncompliance with medication regimen for other reason: Secondary | ICD-10-CM

## 2021-03-04 DIAGNOSIS — Z720 Tobacco use: Secondary | ICD-10-CM

## 2021-03-04 DIAGNOSIS — I255 Ischemic cardiomyopathy: Secondary | ICD-10-CM

## 2021-03-04 DIAGNOSIS — N1831 Chronic kidney disease, stage 3a: Secondary | ICD-10-CM

## 2021-03-04 DIAGNOSIS — E785 Hyperlipidemia, unspecified: Secondary | ICD-10-CM

## 2021-03-04 DIAGNOSIS — I10 Essential (primary) hypertension: Secondary | ICD-10-CM | POA: Diagnosis not present

## 2021-03-04 DIAGNOSIS — Z9114 Patient's other noncompliance with medication regimen: Secondary | ICD-10-CM

## 2021-03-04 DIAGNOSIS — I5043 Acute on chronic combined systolic (congestive) and diastolic (congestive) heart failure: Secondary | ICD-10-CM | POA: Diagnosis not present

## 2021-03-04 DIAGNOSIS — Z789 Other specified health status: Secondary | ICD-10-CM

## 2021-03-04 DIAGNOSIS — Z79899 Other long term (current) drug therapy: Secondary | ICD-10-CM

## 2021-03-04 DIAGNOSIS — Z7289 Other problems related to lifestyle: Secondary | ICD-10-CM

## 2021-03-04 DIAGNOSIS — E876 Hypokalemia: Secondary | ICD-10-CM | POA: Diagnosis not present

## 2021-03-04 DIAGNOSIS — I252 Old myocardial infarction: Secondary | ICD-10-CM

## 2021-03-04 DIAGNOSIS — I2510 Atherosclerotic heart disease of native coronary artery without angina pectoris: Secondary | ICD-10-CM

## 2021-03-04 DIAGNOSIS — F1090 Alcohol use, unspecified, uncomplicated: Secondary | ICD-10-CM

## 2021-03-04 DIAGNOSIS — E781 Pure hyperglyceridemia: Secondary | ICD-10-CM

## 2021-03-04 MED ORDER — CARVEDILOL 25 MG PO TABS: 50.0000 mg | ORAL_TABLET | Freq: Two times a day (BID) | ORAL | 3 refills | Status: DC

## 2021-03-04 MED ORDER — REPATHA SURECLICK 140 MG/ML ~~LOC~~ SOAJ: 1.0000 "pen " | SUBCUTANEOUS | 11 refills | Status: AC

## 2021-03-04 MED ORDER — FUROSEMIDE 40 MG PO TABS: 40.0000 mg | ORAL_TABLET | Freq: Every day | ORAL | 1 refills | Status: DC

## 2021-03-04 MED ORDER — LOSARTAN POTASSIUM 50 MG PO TABS: 50.0000 mg | ORAL_TABLET | Freq: Every day | ORAL | 1 refills | Status: DC

## 2021-03-04 MED ORDER — FENOFIBRATE 145 MG PO TABS: ORAL_TABLET | ORAL | 3 refills | Status: DC

## 2021-03-04 MED ORDER — POTASSIUM CHLORIDE ER 10 MEQ PO TBCR: 10.0000 meq | EXTENDED_RELEASE_TABLET | Freq: Every day | ORAL | 1 refills | Status: DC

## 2021-03-04 MED ORDER — SPIRONOLACTONE 25 MG PO TABS: 12.5000 mg | ORAL_TABLET | Freq: Every day | ORAL | 2 refills | Status: DC

## 2021-03-04 MED ORDER — ATORVASTATIN CALCIUM 80 MG PO TABS: 80.0000 mg | ORAL_TABLET | Freq: Every day | ORAL | 3 refills | Status: DC

## 2021-03-04 MED ORDER — EZETIMIBE 10 MG PO TABS: 10.0000 mg | ORAL_TABLET | Freq: Every day | ORAL | 3 refills | Status: DC

## 2021-03-04 NOTE — Progress Notes (Signed)
Office Visit    Patient Name: Jeff Cobb Date of Encounter: 03/04/2021  PCP:  Jeff Axe, NP   [facility]  Cardiologist:  Jeff Bush, MD  Advanced Practice Provider:  No care team member to display Electrophysiologist:  None  :[phone]   Chief Complaint    Chief Complaint  Patient presents with   Follow-up    2 weeks     50 y.o. male with history of CAD s/p prior LAD stenting in 2016, ischemic cardiomyopathy with EF less than 20%, HFrEF, hypertension, hyperlipidemia, and who presents today for 7mo  follow-up.  Past Medical History    Past Medical History:  Diagnosis Date   CAD S/P percutaneous coronary angioplasty 04/2015   a. 04/2015 Anterior STEMI/PCI @ [facility]: LM nl, mLAD 100% (3.0x15 Xience DES), LCx nl, OM1-3 nl, dRCA 50%, PDA nl.   Chronic combined systolic and diastolic heart failure, NYHA class 2 ([place]) 04/2015   a. 12/2015 Echo: EF 30-35%; b. 09/2018 Echo: EF <20%, sev HK (was not taking meds for ~ 1 yr @ this time)   Hyperlipidemia    Hypertensive heart disease    Ischemic cardiomyopathy    a. 04/2015 Echo: EF 25%, b. 12/2015 Echo: EF 30-35%; c. 09/2018 Echo: EF <20% (not taking meds).   Tobacco abuse    Past Surgical History:  Procedure Laterality Date   CORONARY ANGIOPLASTY WITH STENT PLACEMENT Left 04/2015   @ [facility]: mLAD 100% --> 3.0 x 15 Xience DES   TRANSTHORACIC ECHOCARDIOGRAM  04/2015   @ [facility]: EF 25%, LVH, trivial MR, dilated thoracic aorta, anteroapical and apical akinesis and severely decreased contraction overall   TRANSTHORACIC ECHOCARDIOGRAM  12/2015   Echo: EF 30-35%, mod conc LVH, mid-apicalanteroseptal, anterior, and apical AK, mildly dil LA    Allergies  No Known Allergies  History of Present Illness    Jeff Cobb is a 50 y.o. male with PMH as above.  He suffered an anterior STEMI 04/2015, requiring DES to the LAD at [facility].  He was subsequently noted to have ischemic cardiomyopathy with EF 25% at that time.   2017 echo EF 30 to 35%.  Subsequent follow-up blood work showed renal insufficiency with Entresto and spironolactone held.  He was then lost to follow-up.    09/2018 EF less than 20%.  He was seen in clinic 11/2018 and was noted to be off of all of his medications prior to that echo.    Seen 04/2020 with intermittent compliance with his heart failure medications over the last year.  It was unclear if he was filling his medications at [facility] versus another pharmacy.  Lab work showed hypokalemia; however, he did not obtain follow-up labs as recommended.  Seen at subsequent office visit with ongoing medication noncompliance.    He was admitted 09/02/2120 -09/04/2020 due to acute pulmonary edema in the setting of volume overload and medication noncompliance.  He was successfully IV diuresed and discharged on atorvastatin 80 mg, carvedilol 25 mg twice daily, furosemide 40 mg daily, losartan 12.5 mg daily, spironolactone 12.5 mg daily.  Echo during admission showed LVEF less than 20%, severe LVE, mild LVH, G2 DD, RVSF mildly reduced, mild RVE, mild to moderate LAE, moderate RAE, mild MR.  When seen at follow-up in the clinic, he reported improvement in dyspnea and denied recurrent chest pain.  Losartan was increased to 25 mg daily with plan to transition to [facility].  He was unable to transition at subsequent phone visit,  given no home BP readings recorded.    He was last seen in office 11/01/2020.  He reported that his son had a football scholarship to college in [place], and he was going to go visit it this weekend.  He reported stable mild dyspnea on exertion.  He was taking his Lasix twice daily, although it was prescribed daily.  He was not monitoring his blood pressure at home.  He was started on Entresto 24-26 mg twice daily.  It was noted that, if he felt lightheaded or dehydration, he would need to reduce to Lasix 40 mg daily (rather than taking it twice daily).  SGLT2 inhibitor could be considered at RTC.   Prior to consideration for ICD, medication adherence would need to be confirmed.  Repeat echo was recommended in approximately 3 months after initiation of Entresto.  Labs were obtained.  Tobacco and alcohol use reported with recommendation for complete cessation.  Follow-up labs showed kidney function with slight bump and recommendation to reduce to Lasix 40 mg daily.  Potassium was low with recommendation for KCl tab 10 M EQ daily and repeat BMET in 1 week.  Repeat labs were ordered but not obtained by patient.  Seen 02/19/2021 with Delene Loll self discontinued and report of dizziness on this medication.  He is not regularly checking his blood pressure and uncertain of blood pressure when dizzy on the medication.  He denied any anginal symptoms or symptoms of volume overload.  He reported a diet of prepackaged foods and burgers.  He was occasionally taking an additional Lasix 40 mg daily, estimated at occurring 2-3 times per week.  Initial BP during his clinic visit was 180/100 with repeat BP 154/90.  Weight was 207 pounds and up from previous clinic visit at 205 pounds.  Heart rate was 91 bpm.  He did not wish to restart Entresto with recommendation he restart losartan and dose increase to losartan 50 mg daily with recheck of BMET.  Carvedilol was increased to 37.5 mg twice daily for additional BP support, as well as heart rate control given his elevated heart rate at 91 bpm.  He was encouraged to monitor his BP at home and restart Entresto at RTC if able, given we would need him on GDMT for at least 3 to 6 months before recheck of EF.  If EF remains below 35%, he would need referred to EP for formal consideration of ICD.  He was continued on Lasix 40 mg daily with KCl 10 M EQ daily and spironolactone 12.5 mg daily.  It was discussed that SGLT2 inhibitor should be added if possible at RTC.  Medication compliance was stressed, given previous report and suspicion that he may not be compliant with his medications.   Heart failure clinic nurse education was provided, given we had Dr. Edwena Bunde heart failure nurse with cath at that time.  It was recommended he bring his BP cuff and home log of blood pressure and weight measurements to his next clinic visit.  PCSK9 inhibitors were also discussed, pending repeat labs.  Work note was provided per patient request.  Smoking cessation recommended.  Today, 03/04/2021, he returns to clinic with ongoing elevated pressure at 146/118.  He attributes his elevated BP today due to smoking 1 cigarette this morning at 8:45 AM.  We discussed that this was unlikely to be contributing to this degree of BP elevation.  Delene Loll was again recommended with patient declining this medication.  He has not been monitoring his blood pressure at home.  He has not brought his blood pressure cuff into the office.  We reviewed his diet with patient report that he has been eating well and does not eat any prepackaged foods or add salt to his food.  He denies any chest pain or shortness of breath at rest or with exertion.  He has been working out in the garage and doing dumbbells (30 pounds) for physical activity.  It was recommended that he do jumping jacks or another light cardiovascular activity in addition to weights with patient understanding.  He denies any tachypalpitations.  No presyncope, syncope, or falls.  No signs or symptoms of bleeding.  He denies any signs or symptoms of volume overload, though as below, he is volume overloaded on exam today with elevated BP.  He reports his home weight has varied between 205 and 207 pounds with weight today 205 pounds, which is down 2 pounds from his previous clinic visit.  While he reports medication compliance today, he also reports that he needs refills on all of his medications.  Smoking cessation recommended.  He requests a work note for both this visit and cardiac rehab visits.  He states that he is tolerating cardiac rehab well.  Home Medications    Current Outpatient Medications  Medication Instructions   acetaminophen (TYLENOL) 650 mg, Oral, Every 6 hours PRN   aspirin EC 81 mg, Oral, Daily   atorvastatin (LIPITOR) 80 mg, Oral, Daily   carvedilol (COREG) 50 mg, Oral, 2 times daily   cyanocobalamin 3,000 mcg, Oral, Daily   Evolocumab (REPATHA SURECLICK) 852 MG/ML SOAJ 1 pen, Subcutaneous, Every 14 days   ezetimibe (ZETIA) 10 mg, Oral, Daily   fenofibrate (TRICOR) 145 MG tablet TAKE 1 TABLET(145 MG) BY MOUTH DAILY   furosemide (LASIX) 40 mg, Oral, Daily   losartan (COZAAR) 50 mg, Oral, Daily   Omega-3 Fatty Acids (FISH OIL PO) Oral, Daily   potassium chloride (KLOR-CON) 10 MEQ tablet 10 mEq, Oral, Daily   spironolactone (ALDACTONE) 12.5 mg, Oral, Daily     Review of Systems    He denies chest pain, palpitations, dyspnea, pnd, orthopnea, n, v, dizziness, syncope, edema, weight gain, or early satiety.   All other systems reviewed and are otherwise negative except as noted above.  Physical Exam    VS:  BP (!) 146/118   Pulse 91   Ht 5\' 7"  (1.702 m)   Wt 205 lb (93 kg)   BMI 32.11 kg/m  , BMI Body mass index is 32.11 kg/m. GEN: Well nourished, well developed, in no acute distress. HEENT: normal. Neck: Supple, no JVD, carotid bruits, or masses. Cardiac: RRR, 1/6 systolic murmur.  No rubs, or gallops. No clubbing, cyanosis, or pitting edema.  Radials/DP/PT 2+ and equal bilaterally.  Respiratory:  Respirations regular and unlabored, bilateral reduced breath sounds. GI: Firm, slightly distended,, BS + x 4. MS: no deformity or atrophy.  Right lower extremity with 2 scabbed over/healing wounds on his shin (seen at previous clinic visit). Skin: warm and dry, no rash. Neuro:  Strength and sensation are intact. Psych: Normal affect.  Accessory Clinical Findings    ECG personally reviewed by me today -NSR, 91 bpm, LAE, poor R wave progression in the inferior leads/prior inferior infarct, poor R wave progression V3 to V6/prior  anterolateral infarct, QRS 100 ms, QTC 479 ms and prolonged, left axis deviation, IVCD/LVH and repolarization abnormalities- no acute changes.  VITALS Reviewed today   Temp Readings from Last 3 Encounters:  09/04/20 98.4 F (36.9  C) (Oral)  02/04/16 98.9 F (37.2 C) (Oral)  12/26/15 98.3 F (36.8 C) (Oral)   BP Readings from Last 3 Encounters:  03/04/21 (!) 146/118  02/19/21 (!) 180/110  11/01/20 104/64   Pulse Readings from Last 3 Encounters:  03/04/21 91  02/19/21 91  11/01/20 93    Wt Readings from Last 3 Encounters:  03/04/21 205 lb (93 kg)  02/19/21 207 lb (93.9 kg)  11/01/20 205 lb (93 kg)     LABS  reviewed today   Lab Results  Component Value Date   WBC 11.1 (H) 09/04/2020   HGB 14.5 09/04/2020   HCT 39.8 09/04/2020   MCV 89.4 09/04/2020   PLT 318 09/04/2020   Lab Results  Component Value Date   CREATININE 1.54 (H) 02/19/2021   BUN 16 02/19/2021   NA 142 02/19/2021   K 4.1 02/19/2021   CL 103 02/19/2021   CO2 21 02/19/2021   Lab Results  Component Value Date   ALT 16 02/19/2021   AST 31 02/19/2021   ALKPHOS 37 (L) 02/19/2021   BILITOT 0.6 02/19/2021   Lab Results  Component Value Date   CHOL 215 (H) 02/19/2021   HDL 78 02/19/2021   LDLCALC 115 (H) 02/19/2021   LDLDIRECT 70 12/23/2016   TRIG 125 02/19/2021   CHOLHDL 2.8 02/19/2021    No results found for: HGBA1C No results found for: TSH   STUDIES/PROCEDURES reviewed today     2D echo 09/03/2020: 1. Left ventricular ejection fraction, by estimation, is <20%. The left  ventricle has severely decreased function. The left ventricle demonstrates  global hypokinesis. The left ventricular internal cavity size was severely  dilated. There is mild left  ventricular hypertrophy. Left ventricular diastolic parameters are  consistent with Grade II diastolic dysfunction (pseudonormalization).  Elevated left atrial pressure.   2. Right ventricular systolic function is mildly reduced. The right   ventricular size is mildly enlarged. Tricuspid regurgitation signal is  inadequate for assessing PA pressure.   3. Left atrial size was mild to moderately dilated.   4. Right atrial size was moderately dilated.   5. The mitral valve is grossly normal. Mild mitral valve regurgitation.  No evidence of mitral stenosis.   6. The aortic valve is tricuspid. Aortic valve regurgitation is not  visualized. No aortic stenosis is present.   7. Moderately dilated pulmonary artery. __________   2D echo 09/2018: - Left ventricle: The cavity size was severely dilated. Systolic    function was severely reduced. The estimated ejection fraction    was less than 20% Severe global hypokinesis. Wall motion of basal    to mid inferior wall best preserved. The study is not technically    sufficient to allow evaluation of LV diastolic function.  - Mitral valve: There was mild regurgitation.  - Right ventricle: Systolic function was normal.  - Pulmonary arteries: Systolic pressure could not be accurately    estimated.  __________   2D echo 06/2016: - Left ventricle: The cavity size was mildly dilated. Systolic    function was severely reduced. The estimated ejection fraction    was in the range of 25% to 30%. Severe anterior, anteroseptal and    apical wall hypokinesis. Doppler parameters are consistent with    abnormal left ventricular relaxation (grade 1 diastolic    dysfunction).  - Left atrium: The atrium was normal in size.  - Right ventricle: Systolic function was normal.  - Pulmonary arteries: Systolic pressure was within the normal  range.   Impressions:   - Unable to exclude mural thrombus in the apical region. __________   2D echo 12/2015: - Left ventricle: The cavity size was moderately dilated. There was    moderate concentric hypertrophy. Systolic function was moderately    to severely reduced. The estimated ejection fraction was in the    range of 30% to 35%. Akinesis of the  mid-apicalanteroseptal,    anterior, and apical myocardium. Left ventricular diastolic    function parameters were normal.  - Left atrium: The atrium was mildly dilated.  Assessment & Plan    Coronary artery disease s/p prior STEMI with LAD stenting in 2016 -- Denies any symptoms consistent with angina. Continue GDMT with ASA, beta-blocker, statin, and as needed nitro.  Increased to carvedilol 50 mg twice daily today given his elevated heart rate and blood pressure and as below.  Given his BMI, carvedilol 50 mg twice daily is for further BP support.  No indication for repeat ischemic evaluation at this time.  Discussed importance of medication compliance, as well as lifestyle changes with patient understanding.  HFrEF/ischemic cardiomyopathy - Denies any dyspnea or shortness of breath.  Volume up on exam with recommendation for 2 days of increased diuresis and potassium as outlined below.  He continues to defer Entresto/decline recommended restart of Entresto due to report of dizziness on this medication and has not checked his blood pressure as recommended on this medication or between visits.  He has not brought a BP cuff into the office or BP log.  Continue losartan at this time and reassess willingness to transition to [place] at [facility].  He understands that Delene Loll is important to improve his pump function and that EP referral and ICD consideration depend on compliance.  Increase to carvedilol 50 mg twice daily as above for further BP support, given ongoing elevated pressure.   Encouraged him to check his BP and daily weights and his bring log into clinic, as well as his BP cuff.  Reviewed restrictions for salt and fluids. Given volume up, plan for 2d increased diuresis with Lasix 80 mg daily and KCl tab 20 M EQ daily.  After 2 days increased diuresis, he will drop back down to Lasix 40 mg daily and KCl tab 10 M EQ daily. Continue spironolactone 12.5 mg daily.   At RTC, reassess if patient is  willing to transition to Entresto/SGLT2 inhibitor.  He declined Entresto/SGLT2 inhibitor today.  Continue losartan for now.   Once able to confirm medication compliance and escalate to full GDMT, repeat echo. If EF remains below 35% on repeat echo with full GDMT, he should be referred to EP for formal consideration of ICD.   Escalation of GDMT as tolerated or agreed upon by patient at RTC.  Essential hypertension, goal BP 130/80 or lower -- BP poorly controlled with recommendations as above to increase diuresis for 2days Lasix 80 mg and KCl tab 20 M EQ x2 days then back down to Lasix 40 mg daily and KCl tab 10 M EQ daily.  Given high BMI, acceptable to increase to carvedilol 50 mg twice daily for further BP support.  At RTC, and pending repeat labs, consider escalation of spironolactone for further BP support.  Suspect some medication and diet noncompliance is influencing his elevated BP today.  Salt and fluid restrictions reviewed with total daily intake under 2 L/day and total salt intake under 2 g/day recommended.  Recommend monitor BP and fluid at home and bring log/BP cuff into  his next clinic visit.  Discussed proper measurement techniques/weight techniques.  Hyperlipidemia, LDL goal less than 70 Hypertriglyceridemia -- Given LDL remains uncontrolled despite high intensity statin and Zetia, addition of PCSK9 inhibitor recommended at this time.  He is agreeable to start [place] preauthorization process today.  Continue atorvastatin 80 mg daily, Zetia 10mg  daily for now with fenofibrate 145 mg daily for now and pending repeat lipid and liver function in 6 to 8 weeks.   Tobacco/alcohol use -- Complete cessation recommended.  He reports he is still smoking.  CKD 3 -- Caution with titration of diuretics and renally process medications.  Glycemic and BP control recommended for overall / cardiovascular /  renal health.  Medication noncompliance -- Stressed compliance with medications and  diet/lifestyle changes.  Stressed compliance for follow-up labs.  Discussed importance of monitoring BP and weight at home.  We will provide refills for medications again as he reports he is still out of them.  *Work note provided for his employer today to verify that he did have a clinic visit and that he has cardiac rehab. --------------------------------------------------------  Medication changes:  --Start Repatha injection. --Increase to carvedilol 50 mg twice daily --Increased dose diuresis with furosemide 80 mg and KCl tab 20 M EQ x 2 days then drop down to initial Lasix 40 mg daily and KCl tab 10 M EQ daily thereafter Labs ordered:  -- BMET today.  Lipid and liver function in 6 to 8 weeks Studies / Imaging ordered:  -- Echo once on 3 months of full GDMT Future considerations:  -- Addition of SGLT2 inhibitor / Lisabeth Register, declined by patient today -- Referral to EP for consideration of ICD if EF remains below 35% on echo once on GDMT for 3 months --also if medication compliance confirmed.  Disposition:  -- RTC 1 month.  Repeat BMET at that time.  *Please be aware that the above documentation was completed voice recognition software and may contain dictation errors.    Arvil Chaco, PA-C 03/04/2021

## 2021-03-04 NOTE — Telephone Encounter (Signed)
Clinical questions were answered and submitted.   Wait for Determination Please wait for [employer] NCPDP 2017 to return a determination.  It looks like praluent is going to be preferred but I went head and completed the PA anyway.

## 2021-03-04 NOTE — Telephone Encounter (Addendum)
PA started through [employer] - Rx #: [id number]  Your demographic data has been sent to [facility] successfully!  [employer] typically takes 5-10 minutes to respond, but it may take a little longer in some cases. You will be notified by email when available. You can also check for an update later by opening this request from your dashboard. Please do not fax or call [employer] to resubmit this request. If you need assistance, please chat with CoverMyMeds or call us at [phone].  If it has been longer than 24 hours, please reach out to [place].

## 2021-03-04 NOTE — Patient Instructions (Signed)
Medication Instructions:  Your physician has recommended you make the following change in your medication:   INCREASE Carvedilol to 50mg  TWICE daily (Carvedilol 25mg  tablet take 2 tablets TWICE daily)  TAKE Furosemide 80mg  daily x 2 days THEN resume 40mg  daily thereafter   TAKE Potassium 20 mEq daily x 2 days THEN resume 10 mEq daily thereafter  START Repatha injection - 1 pen injection into the skin every 14 days   *If you need a refill on your cardiac medications before your next appointment, please call your pharmacy*   Lab Work:   -  Your physician recommends that you return for FASTING lab work in: 6-8 weeks (lipid/liver panel)  -  Please go to the [facility]. You will check in at the front desk to the right as you walk into the atrium. Valet Parking is offered if needed. - No appointment needed. You may go any day between 7 am and 6 pm.    Testing/Procedures: None ordered   Follow-Up: At [facility], you and your health needs are our priority.  As part of our continuing mission to provide you with exceptional heart care, we have created designated Provider Care Teams.  These Care Teams include your primary Cardiologist (physician) and Advanced Practice Providers (APPs -  Physician Assistants and Nurse Practitioners) who all work together to provide you with the care you need, when you need it.  We recommend signing up for the patient portal called "MyChart".  Sign up information is provided on this After Visit Summary.  MyChart is used to connect with patients for Virtual Visits (Telemedicine).  Patients are able to view lab/test results, encounter notes, upcoming appointments, etc.  Non-urgent messages can be sent to your provider as well.   To learn more about what you can do with MyChart, go to [email or web address].    Your next appointment:   1 month(s) w/ BMET  The format for your next appointment:   In Person  Provider:   You may see Nelva Bush,  MD or one of the following Advanced Practice Providers on your designated Care Team:    Marrianne Mood, [place]

## 2021-03-07 NOTE — Telephone Encounter (Signed)
Answered clinical questions.   Jeff Cobb (Key: [id number]) Repatha SureClick 140MG /ML auto-injectors Wait for Determination Please wait for [employer] NCPDP 2017 to return a determination.  I still think this one is going to be denied as well because it stated in the questions that Praluent was preferred.

## 2021-03-07 NOTE — Telephone Encounter (Signed)
The request was denied because:  Current plan approved criteria does not allow coverage of Repatha if there is a lack of information including but not limited to: Has the patient experienced a documented intolerable adverse event to Praluent and the prescriber has a compelling medical rationale for not expecting the same event to occur with Repatha?

## 2021-03-07 NOTE — Telephone Encounter (Addendum)
Covermymeds called  -- Spoke with Milka.   She stated that the insurance company did a soft denial due to them wanting more information.  She stated she opened a new PA through covermymeds with the additional questions and provided me with the Key: [id number]  New PA completed -- Awaiting clinical questions.   Your demographic data has been sent to [facility] successfully!  [employer] typically takes 5-10 minutes to respond, but it may take a little longer in some cases. You will be notified by email when available. You can also check for an update later by opening this request from your dashboard. Please do not fax or call [employer] to resubmit this request. If you need assistance, please chat with CoverMyMeds or call us at [phone].  If it has been longer than 24 hours, please reach out to [place].

## 2021-03-10 NOTE — Telephone Encounter (Signed)
Pt has been approved for Repatha 03/07/2021-03/07/2022.

## 2021-04-09 NOTE — Progress Notes (Signed)
Follow-up Outpatient Visit Date: 04/10/2021  Primary Care Provider: Luciana Axe, NP No address on file  Chief Complaint: Follow-up heart failure and coronary artery disease  HPI:  Jeff Cobb is a 50 y.o. male with history of coronary artery disease status post anterior STEMI and PCI [facility] in 10/4095, chronic systolic heart failure secondary to ischemic cardiomyopathy (LVEF less than 20% on most recent echo in 08/2020), hypertension, and hyperlipidemia, who presents for follow-up of chronic HFrEF and coronary artery disease.  He was last seen in the office a month ago by Jeff Quarry, PA at which time he was noted to be persistently hypertensive.  Jeff Cobb was recommended (this has been suggested multiple times in the past), though Jeff Cobb continued to decline due to prior dizziness while on this medication.  Carvedilol was increased to 50 mg twice daily.  He was maintained on losartan 50 mg daily.  Jeff Cobb was advised to increase his furosemide from 40 mg daily to 80 mg daily x2 days due to volume overload in the office.  Today, Jeff Cobb reports that he is feeling better than at his last visit.  He has made significant changes to his diet with resultant 10 pound weight loss.  He is feeling better with more energy.  He denies chest pain, shortness of breath, palpitations, lightheadedness, and edema.  He is now down to furosemide 40 mg daily with good control of his fluid.  He walks about 15,000 steps a day.  He notes some fatigue, particularly when it is hot.  He did some research about Repatha and Praluent, given suboptimal lipid control, and is reluctant to try one of these medications as he does not like giving himself shots.  --------------------------------------------------------------------------------------------------  Past Medical History:  Diagnosis Date   CAD S/P percutaneous coronary angioplasty 04/2015   a. 04/2015 Anterior STEMI/PCI @ [facility]: LM nl, mLAD 100% (3.0x15 Xience  DES), LCx nl, OM1-3 nl, dRCA 50%, PDA nl.   Chronic combined systolic and diastolic heart failure, NYHA class 2 ([place]) 04/2015   a. 12/2015 Echo: EF 30-35%; b. 09/2018 Echo: EF <20%, sev HK (was not taking meds for ~ 1 yr @ this time)   Hyperlipidemia    Hypertensive heart disease    Ischemic cardiomyopathy    a. 04/2015 Echo: EF 25%, b. 12/2015 Echo: EF 30-35%; c. 09/2018 Echo: EF <20% (not taking meds).   Tobacco abuse    Past Surgical History:  Procedure Laterality Date   CORONARY ANGIOPLASTY WITH STENT PLACEMENT Left 04/2015   @ [facility]: mLAD 100% --> 3.0 x 15 Xience DES   TRANSTHORACIC ECHOCARDIOGRAM  04/2015   @ [facility]: EF 25%, LVH, trivial MR, dilated thoracic aorta, anteroapical and apical akinesis and severely decreased contraction overall   TRANSTHORACIC ECHOCARDIOGRAM  12/2015   Echo: EF 30-35%, mod conc LVH, mid-apicalanteroseptal, anterior, and apical AK, mildly dil LA    Current Meds  Medication Sig   acetaminophen (TYLENOL) 325 MG tablet Take 650 mg by mouth every 6 (six) hours as needed for mild pain, fever or headache.   aspirin EC 81 MG tablet Take 81 mg by mouth daily.   atorvastatin (LIPITOR) 80 MG tablet Take 1 tablet (80 mg total) by mouth daily.   carvedilol (COREG) 25 MG tablet Take 2 tablets (50 mg total) by mouth 2 (two) times daily.   cyanocobalamin 1000 MCG tablet Take 3,000 mcg by mouth daily.   ezetimibe (ZETIA) 10 MG tablet Take 1 tablet (10 mg total) by  mouth daily.   fenofibrate (TRICOR) 145 MG tablet TAKE 1 TABLET(145 MG) BY MOUTH DAILY   furosemide (LASIX) 40 MG tablet Take 1 tablet (40 mg total) by mouth daily.   losartan (COZAAR) 50 MG tablet Take 1 tablet (50 mg total) by mouth daily.   Omega-3 Fatty Acids (FISH OIL PO) Take by mouth daily.   potassium chloride (KLOR-CON) 10 MEQ tablet Take 1 tablet (10 mEq total) by mouth daily.   spironolactone (ALDACTONE) 25 MG tablet Take 0.5 tablets (12.5 mg total) by mouth daily.    Allergies: Patient has no known  allergies.  Social History   Tobacco Use   Smoking status: Some Days    Packs/day: 0.10    Types: Cigarettes   Smokeless tobacco: Never  Vaping Use   Vaping Use: Never used  Substance Use Topics   Alcohol use: Yes    Alcohol/week: 2.0 standard drinks    Types: 2 Shots of liquor per week    Comment: daily   Drug use: Yes    Types: Marijuana    Family History  Problem Relation Age of Onset   Heart disease Father    Diabetes Mellitus II Father    Heart attack Father     Review of Systems: A 12-system review of systems was performed and was negative except as noted in the HPI.  --------------------------------------------------------------------------------------------------  Physical Exam: BP 120/76 (BP Location: Left Arm, Patient Position: Sitting, Cuff Size: Large)   Pulse 90   Ht 5\' 7"  (1.702 m)   Wt 197 lb (89.4 kg)   SpO2 98%   BMI 30.85 kg/m   General:  NAD. Neck: No JVD or HJR. Lungs: Clear to auscultation bilaterally without wheezes or crackles. Heart: Regular rate and rhythm without murmurs, rubs, or gallops. Abdomen: Soft, nontender, nondistended. Extremities: No lower extremity edema.   Lab Results  Component Value Date   WBC 11.1 (H) 09/04/2020   HGB 14.5 09/04/2020   HCT 39.8 09/04/2020   MCV 89.4 09/04/2020   PLT 318 09/04/2020    Lab Results  Component Value Date   NA 142 02/19/2021   K 4.1 02/19/2021   CL 103 02/19/2021   CO2 21 02/19/2021   BUN 16 02/19/2021   CREATININE 1.54 (H) 02/19/2021   GLUCOSE 83 02/19/2021   ALT 16 02/19/2021    Lab Results  Component Value Date   CHOL 215 (H) 02/19/2021   HDL 78 02/19/2021   LDLCALC 115 (H) 02/19/2021   LDLDIRECT 70 12/23/2016   TRIG 125 02/19/2021   CHOLHDL 2.8 02/19/2021    --------------------------------------------------------------------------------------------------  ASSESSMENT AND PLAN: Chronic HFrEF due to ischemic cardiomyopathy: Jeff Cobb appears euvolemic on exam  today with NYHA class II symptoms, notably improved with dietary changes since his last visit.  His blood pressure has also improved.  Given significant decline in blood pressure from last month, I think it would be best to defer medication changes today, continuing current doses of carvedilol, losartan, and spironolactone.  Escalation of GDMT will need to be considered at follow-up based on blood pressures going forward.  He again declines retrial of Entresto, given prior dizziness with this medication.  We discussed referral to EP for consideration of ICD for primary prevention given his persistently reduced LVEF.  However, Jeff Cobb is hopeful that his dietary changes will help strengthen his heart and wishes to defer EP consultation at this time.  We we will repeat a limited echo in 3 months to reassess LVEF.  If  it has not improved significantly, EP referral will be pursued at that time.  Coronary artery disease: No angina reported.  Continue current medications for secondary prevention.  Hyperlipidemia: LDL suboptimal in spite of high intensity atorvastatin and ezetimibe.  Jeff Cobb declines PCSK9 inhibitor.  I am hopeful that his lifestyle modifications will bring his LDL down to target.  If not, we could consider switching atorvastatin to rosuvastatin or adding bempedoic acid.  Continue fenofibrate.  Chronic kidney disease stage III: Creatinine stable on last check a month ago.  Continue current medications.  Hypertension: Blood pressure improved and at goal today.  Continue lifestyle modifications without medication changes at this time.  Follow-up: Return to clinic in 3 months, shortly after limited echo to reassess LVEF.  Nelva Bush, MD 04/10/2021 8:09 AM

## 2021-04-10 ENCOUNTER — Other Ambulatory Visit: Payer: Self-pay

## 2021-04-10 ENCOUNTER — Ambulatory Visit (INDEPENDENT_AMBULATORY_CARE_PROVIDER_SITE_OTHER): Payer: BC Managed Care – PPO | Admitting: Internal Medicine

## 2021-04-10 ENCOUNTER — Encounter: Payer: Self-pay | Admitting: Internal Medicine

## 2021-04-10 VITALS — BP 120/76 | HR 90 | Ht 67.0 in | Wt 197.0 lb

## 2021-04-10 DIAGNOSIS — I2510 Atherosclerotic heart disease of native coronary artery without angina pectoris: Secondary | ICD-10-CM | POA: Diagnosis not present

## 2021-04-10 DIAGNOSIS — E782 Mixed hyperlipidemia: Secondary | ICD-10-CM

## 2021-04-10 DIAGNOSIS — N1831 Chronic kidney disease, stage 3a: Secondary | ICD-10-CM | POA: Diagnosis not present

## 2021-04-10 DIAGNOSIS — I5022 Chronic systolic (congestive) heart failure: Secondary | ICD-10-CM

## 2021-04-10 DIAGNOSIS — I10 Essential (primary) hypertension: Secondary | ICD-10-CM | POA: Insufficient documentation

## 2021-04-10 NOTE — Patient Instructions (Addendum)
Medication Instructions:   Your physician recommends that you continue on your current medications as directed. Please refer to the Current Medication list given to you today.  *If you need a refill on your cardiac medications before your next appointment, please call your pharmacy*   Lab Work:  None ordered   Testing/Procedures:  Your physician has requested that you have an echocardiogram in 3 MONTHS. Echocardiography is a painless test that uses sound waves to create images of your heart. It provides your doctor with information about the size and shape of your heart and how well your heart's chambers and valves are working. This procedure takes approximately one hour. There are no restrictions for this procedure.    Follow-Up: At [facility], you and your health needs are our priority.  As part of our continuing mission to provide you with exceptional heart care, we have created designated Provider Care Teams.  These Care Teams include your primary Cardiologist (physician) and Advanced Practice Providers (APPs -  Physician Assistants and Nurse Practitioners) who all work together to provide you with the care you need, when you need it.  We recommend signing up for the patient portal called "MyChart".  Sign up information is provided on this After Visit Summary.  MyChart is used to connect with patients for Virtual Visits (Telemedicine).  Patients are able to view lab/test results, encounter notes, upcoming appointments, etc.  Non-urgent messages can be sent to your provider as well.   To learn more about what you can do with MyChart, go to [email or web address].    Your next appointment:   3 month(s) AFTER Echo  The format for your next appointment:   In Person  Provider:   You may see Nelva Bush, MD or one of the following Advanced Practice Providers on your designated Care Team:   Murray Hodgkins, NP Christell Faith, PA-C Marrianne Mood, PA-C Cadence [place], [place]

## 2021-04-29 ENCOUNTER — Telehealth: Payer: Self-pay | Admitting: Internal Medicine

## 2021-04-29 DIAGNOSIS — Z79899 Other long term (current) drug therapy: Secondary | ICD-10-CM

## 2021-04-29 DIAGNOSIS — I10 Essential (primary) hypertension: Secondary | ICD-10-CM

## 2021-04-29 DIAGNOSIS — E876 Hypokalemia: Secondary | ICD-10-CM

## 2021-04-29 MED ORDER — FUROSEMIDE 40 MG PO TABS: 40.0000 mg | ORAL_TABLET | Freq: Every day | ORAL | 3 refills | Status: DC

## 2021-04-29 MED ORDER — SPIRONOLACTONE 25 MG PO TABS: 12.5000 mg | ORAL_TABLET | Freq: Every day | ORAL | 3 refills | Status: DC

## 2021-04-29 NOTE — Telephone Encounter (Signed)
At office visit on 03/04/21 pt was instructed to incr Lasix to 80 mg x 2 days only.  S/w pt, he states he proceeded to take incr dose longer than 2 days.  Now returned to Lasix '40mg'$  daily, however, he has run out of Rx early.  He also confirms he is taking Spironolactone 12.5 mg daily as well.   He is requesting refills for both Spironolactone and Lasix as he is out.  S/w [facility] pharmacy. Confirmed that pt's insurance will begin covering refills again on 8/21. Pharmacy will fill both Lasix and Spironolactone for 13 days to cover pt until 8/21 and pt's out of pocket cost for the 13 days will be $15.81.  S/w pt to make aware of above info. Pt states he will p/u Rx for 13 days and understands on 8/21 he may pick up refill that insurance will resume coverage at that time.  Pt verbalized understanding to continue Lasix as written 40 mg daily and Spironolactone 12.5 mg daily.

## 2021-04-29 NOTE — Telephone Encounter (Signed)
Patient calling  States the pharmacy ([facility] on [address]) will not refill medication because he is not due to get refills til September  Patient was told to take extra of each  Furosemide 40 MG 2 times daily  Spironolactone 25 MG 1 tablet daily  And that is why he is out Please call to discuss

## 2021-07-09 ENCOUNTER — Other Ambulatory Visit: Payer: BC Managed Care – PPO

## 2021-07-10 ENCOUNTER — Ambulatory Visit: Payer: BC Managed Care – PPO | Admitting: Medical

## 2021-08-15 ENCOUNTER — Other Ambulatory Visit: Payer: Self-pay

## 2021-08-19 NOTE — Progress Notes (Deleted)
Cardiology Office Note:    Date:  08/19/2021   ID:  Tran Arzuaga, DOB July 14, 1971, MRN [id number]  PCP:  Luciana Axe, NP  [facility] Cardiologist:  Nelva Bush, MD  [facility] Electrophysiologist:  None   Referring MD: Luciana Axe, NP   Chief Complaint: 3 month follow-up  History of Present Illness:    Nalin Mazzocco is a 50 y.o. male with a hx of coronary artery disease status post anterior STEMI and PCI [facility] in 0/1027, chronic systolic heart failure secondary to ischemic cardiomyopathy (LVEF less than 20% on most recent echo in 08/2020), hypertension, and hyperlipidemia, who presents for follow-up of chronic HFrEF and coronary artery disease.  He was seen in the office a month ago by Lorenso Quarry, PA at which time he was noted to be persistently hypertensive.  Delene Loll was recommended (this has been suggested multiple times in the past), though Mr. Kulinski continued to decline due to prior dizziness while on this medication.  Carvedilol was increased to 50 mg twice daily.  He was maintained on losartan 50 mg daily.  Mr. Friedmann was advised to increase his furosemide from 40 mg daily to 80 mg daily x2 days due to volume overload in the office.  Last seen 04/10/21 and was feeling better. He had lost 10 lbs. He was euvolemic. He declined retrial of entresto. Deferred referral tp EP for ICD.  Today,   Past Medical History:  Diagnosis Date   CAD S/P percutaneous coronary angioplasty 04/2015   a. 04/2015 Anterior STEMI/PCI @ [facility]: LM nl, mLAD 100% (3.0x15 Xience DES), LCx nl, OM1-3 nl, dRCA 50%, PDA nl.   Chronic combined systolic and diastolic heart failure, NYHA class 2 ([place]) 04/2015   a. 12/2015 Echo: EF 30-35%; b. 09/2018 Echo: EF <20%, sev HK (was not taking meds for ~ 1 yr @ this time)   Hyperlipidemia    Hypertensive heart disease    Ischemic cardiomyopathy    a. 04/2015 Echo: EF 25%, b. 12/2015 Echo: EF 30-35%; c. 09/2018 Echo: EF <20% (not taking meds).   Tobacco abuse      Past Surgical History:  Procedure Laterality Date   CORONARY ANGIOPLASTY WITH STENT PLACEMENT Left 04/2015   @ [place]: mLAD 100% --> 3.0 x 15 Xience DES   TRANSTHORACIC ECHOCARDIOGRAM  04/2015   @ [facility]: EF 25%, LVH, trivial MR, dilated thoracic aorta, anteroapical and apical akinesis and severely decreased contraction overall   TRANSTHORACIC ECHOCARDIOGRAM  12/2015   Echo: EF 30-35%, mod conc LVH, mid-apicalanteroseptal, anterior, and apical AK, mildly dil LA    Current Medications: No outpatient medications have been marked as taking for the 08/22/21 encounter (Appointment) with Kathlen Mody, Rashon Westrup H, PA-C.     Allergies:   Patient has no known allergies.   Social History   Socioeconomic History   Marital status: Married    Spouse name: Not on file   Number of children: Not on file   Years of education: Not on file   Highest education level: Not on file  Occupational History   Occupation: unemployed  Tobacco Use   Smoking status: Some Days    Packs/day: 0.10    Types: Cigarettes   Smokeless tobacco: Never  Vaping Use   Vaping Use: Never used  Substance and Sexual Activity   Alcohol use: Yes    Alcohol/week: 2.0 standard drinks    Types: 2 Shots of liquor per week    Comment: daily   Drug use: Yes  Types: Marijuana   Sexual activity: Not on file  Other Topics Concern   Not on file  Social History Narrative   Not on file   Social Determinants of Health   Financial Resource Strain: Not on file  Food Insecurity: Not on file  Transportation Needs: Not on file  Physical Activity: Not on file  Stress: Not on file  Social Connections: Not on file     Family History: The patient's family history includes Diabetes Mellitus II in his father; Heart attack in his father; Heart disease in his father.  ROS:   Please see the history of present illness.     All other systems reviewed and are negative.  EKGs/Labs/Other Studies Reviewed:    The following studies were  reviewed today:  Echo limited 03/2021 ordered  Echo 08/2020 1. Left ventricular ejection fraction, by estimation, is <20%. The left  ventricle has severely decreased function. The left ventricle demonstrates  global hypokinesis. The left ventricular internal cavity size was severely  dilated. There is mild left  ventricular hypertrophy. Left ventricular diastolic parameters are  consistent with Grade II diastolic dysfunction (pseudonormalization).  Elevated left atrial pressure.   2. Right ventricular systolic function is mildly reduced. The right  ventricular size is mildly enlarged. Tricuspid regurgitation signal is  inadequate for assessing PA pressure.   3. Left atrial size was mild to moderately dilated.   4. Right atrial size was moderately dilated.   5. The mitral valve is grossly normal. Mild mitral valve regurgitation.  No evidence of mitral stenosis.   6. The aortic valve is tricuspid. Aortic valve regurgitation is not  visualized. No aortic stenosis is present.   7. Moderately dilated pulmonary artery.   04/2015 Echo: EF 25%, b. 12/2015 Echo: EF 30-35%; c. 09/2018 Echo: EF <20% (not taking meds).  04/2015 Anterior STEMI/PCI @ [facility]: LM nl, mLAD 100% (3.0x15 Xience DES), LCx nl, OM1-3 nl, dRCA 50%, PDA nl.  EKG:  EKG is *** ordered today.  The ekg ordered today demonstrates ***  Recent Labs: 08/21/2020: Magnesium 1.6 09/04/2020: Hemoglobin 14.5; Platelets 318 11/01/2020: BNP 152.6 02/19/2021: ALT 16; BUN 16; Creatinine, Ser 1.54; Potassium 4.1; Sodium 142  Recent Lipid Panel    Component Value Date/Time   CHOL 215 (H) 02/19/2021 1533   TRIG 125 02/19/2021 1533   HDL 78 02/19/2021 1533   CHOLHDL 2.8 02/19/2021 1533   CHOLHDL NOT REPORTED DUE TO HIGH TRIGLYCERIDES 03/16/2017 0734   VLDL UNABLE TO CALCULATE IF TRIGLYCERIDE OVER 400 mg/dL 03/16/2017 0734   LDLCALC 115 (H) 02/19/2021 1533   LDLDIRECT 70 12/23/2016 0725     Risk Assessment/Calculations:   {Does this patient  have ATRIAL FIBRILLATION?:[phone]}   Physical Exam:    VS:  There were no vitals taken for this visit.    Wt Readings from Last 3 Encounters:  04/10/21 197 lb (89.4 kg)  03/04/21 205 lb (93 kg)  02/19/21 207 lb (93.9 kg)     GEN: *** Well nourished, well developed in no acute distress HEENT: Normal NECK: No JVD; No carotid bruits LYMPHATICS: No lymphadenopathy CARDIAC: ***RRR, no murmurs, rubs, gallops RESPIRATORY:  Clear to auscultation without rales, wheezing or rhonchi  ABDOMEN: Soft, non-tender, non-distended MUSCULOSKELETAL:  No edema; No deformity  SKIN: Warm and dry NEUROLOGIC:  Alert and oriented x 3 PSYCHIATRIC:  Normal affect   ASSESSMENT:    No diagnosis found. PLAN:    In order of problems listed above:  HFrEF ICM  CAD  HLD  CKD stage 3  HTN  Disposition: Follow up {follow up:15908} with ***   Shared Decision Making/Informed Consent   {Are you ordering a CV Procedure (e.g. stress test, cath, DCCV, TEE, etc)?   Press F2        :[id number]}    Signed, Laverne Klugh Ninfa Meeker, PA-C  08/19/2021 2:52 PM    [facility]

## 2021-08-22 ENCOUNTER — Ambulatory Visit: Payer: Self-pay | Admitting: Medical

## 2021-08-25 ENCOUNTER — Encounter: Payer: Self-pay | Admitting: Medical

## 2021-12-28 IMAGING — DX DG CHEST 1V PORT
1 series · 1 of 1 positions shown · non-contrast
Comparison: 12/24/2015

CLINICAL DATA: Short of breath

EXAM:
PORTABLE CHEST 1 VIEW

[chest ap]
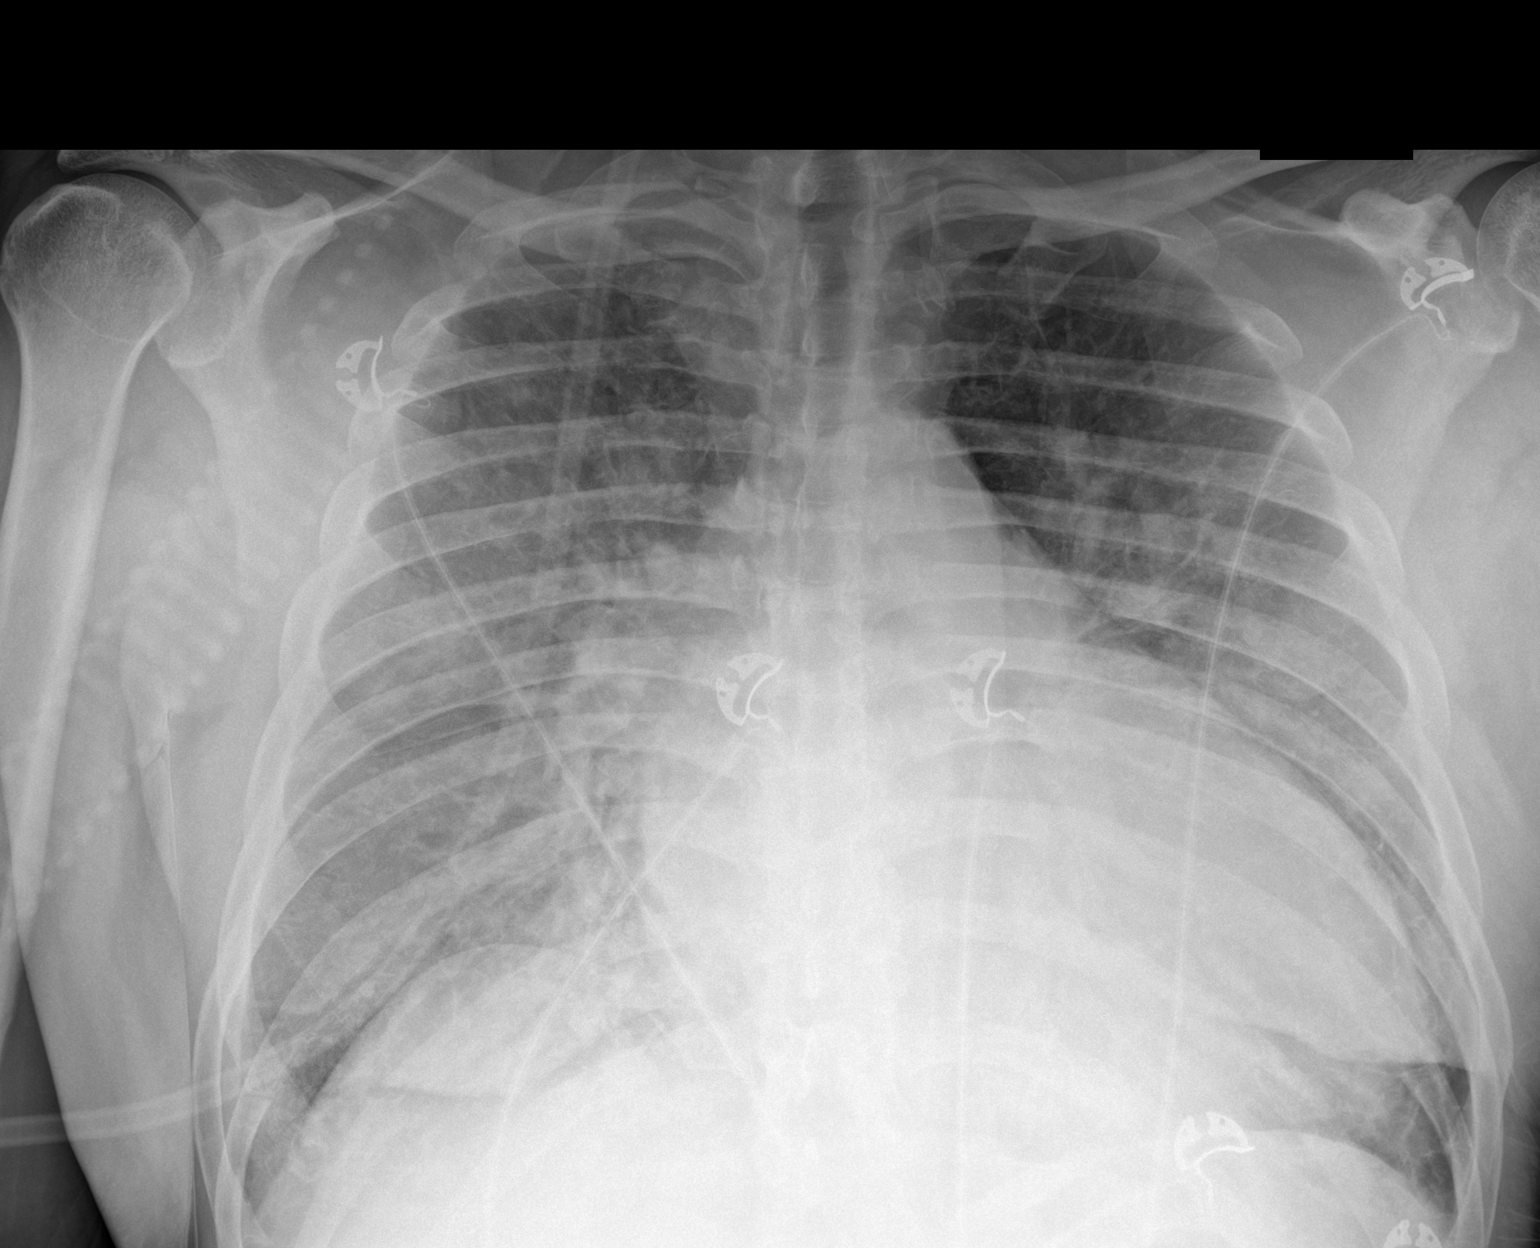

[1 of 1 positions shown; findings below may reference images not displayed]

FINDINGS: Cardiomegaly with vascular congestion and hazy bilateral lung
opacity, probably due to pulmonary edema. Possible small effusions.
No pneumothorax.
IMPRESSION: Cardiomegaly with vascular congestion and hazy bilateral lung
opacity probably due to pulmonary edema.

## 2021-12-30 ENCOUNTER — Encounter: Payer: Self-pay | Admitting: Internal Medicine

## 2021-12-30 NOTE — Progress Notes (Signed)
Unable to contact patient to schedule echocardiogram, letter sent, order cancelled. 

## 2022-01-01 ENCOUNTER — Other Ambulatory Visit: Payer: Self-pay

## 2022-01-01 DIAGNOSIS — Z79899 Other long term (current) drug therapy: Secondary | ICD-10-CM

## 2022-01-01 DIAGNOSIS — E785 Hyperlipidemia, unspecified: Secondary | ICD-10-CM

## 2022-01-01 DIAGNOSIS — I10 Essential (primary) hypertension: Secondary | ICD-10-CM

## 2022-01-01 DIAGNOSIS — E876 Hypokalemia: Secondary | ICD-10-CM

## 2022-01-01 DIAGNOSIS — I5043 Acute on chronic combined systolic (congestive) and diastolic (congestive) heart failure: Secondary | ICD-10-CM

## 2022-01-01 MED ORDER — CARVEDILOL 25 MG PO TABS: 50.0000 mg | ORAL_TABLET | Freq: Two times a day (BID) | ORAL | 0 refills | Status: DC

## 2022-01-01 MED ORDER — POTASSIUM CHLORIDE ER 10 MEQ PO TBCR
10.0000 meq | EXTENDED_RELEASE_TABLET | Freq: Every day | ORAL | 0 refills | Status: DC
Start: 2022-01-01 — End: 2022-01-23

## 2022-01-01 MED ORDER — EZETIMIBE 10 MG PO TABS: 10.0000 mg | ORAL_TABLET | Freq: Every day | ORAL | 0 refills | Status: DC

## 2022-01-01 MED ORDER — LOSARTAN POTASSIUM 50 MG PO TABS: 50.0000 mg | ORAL_TABLET | Freq: Every day | ORAL | 0 refills | Status: DC

## 2022-01-01 MED ORDER — FUROSEMIDE 40 MG PO TABS: 40.0000 mg | ORAL_TABLET | Freq: Every day | ORAL | 0 refills | Status: DC

## 2022-01-01 MED ORDER — ATORVASTATIN CALCIUM 80 MG PO TABS: 80.0000 mg | ORAL_TABLET | Freq: Every day | ORAL | 0 refills | Status: DC

## 2022-01-01 MED ORDER — SPIRONOLACTONE 25 MG PO TABS: 12.5000 mg | ORAL_TABLET | Freq: Every day | ORAL | 3 refills | Status: DC

## 2022-01-01 NOTE — Telephone Encounter (Signed)
*  STAT* If patient is at the pharmacy, call can be transferred to refill team.   1. Which medications need to be refilled? (please list name of each medication and dose if known) spironolactone, Potassium, Losartan, Lasix, Zetia, Coreg, Lipitor  2. Which pharmacy/location (including street and city if local pharmacy) is medication to be sent to?[facility] [place]  3. Do they need a 30 day or 90 day supply? [place]

## 2022-01-19 ENCOUNTER — Other Ambulatory Visit: Payer: Self-pay | Admitting: Internal Medicine

## 2022-01-19 DIAGNOSIS — I5022 Chronic systolic (congestive) heart failure: Secondary | ICD-10-CM

## 2022-01-21 ENCOUNTER — Ambulatory Visit (INDEPENDENT_AMBULATORY_CARE_PROVIDER_SITE_OTHER): Payer: 59

## 2022-01-21 DIAGNOSIS — I5022 Chronic systolic (congestive) heart failure: Secondary | ICD-10-CM | POA: Diagnosis not present

## 2022-01-21 LAB — ECHOCARDIOGRAM COMPLETE
AR max vel: 3.18 cm2
AV Area VTI: 2.81 cm2
AV Area mean vel: 3.21 cm2
AV Mean grad: 2 mmHg
AV Peak grad: 3 mmHg
Ao pk vel: 0.87 m/s
Calc EF: 18.7 %
S' Lateral: 6.8 cm
Single Plane A2C EF: 19 %
Single Plane A4C EF: 20.2 %

## 2022-01-21 MED ORDER — PERFLUTREN LIPID MICROSPHERE
1.0000 mL | INTRAVENOUS | Status: AC | PRN
  Administered 2022-01-21: 2 mL via INTRAVENOUS

## 2022-01-23 ENCOUNTER — Encounter: Payer: Self-pay | Admitting: Medical

## 2022-01-23 ENCOUNTER — Ambulatory Visit (INDEPENDENT_AMBULATORY_CARE_PROVIDER_SITE_OTHER): Payer: 59 | Admitting: Medical

## 2022-01-23 VITALS — HR 107 | Ht 67.0 in | Wt 203.0 lb

## 2022-01-23 DIAGNOSIS — I5042 Chronic combined systolic (congestive) and diastolic (congestive) heart failure: Secondary | ICD-10-CM | POA: Diagnosis not present

## 2022-01-23 DIAGNOSIS — N183 Chronic kidney disease, stage 3 unspecified: Secondary | ICD-10-CM

## 2022-01-23 DIAGNOSIS — R000 Tachycardia, unspecified: Secondary | ICD-10-CM | POA: Diagnosis not present

## 2022-01-23 DIAGNOSIS — E785 Hyperlipidemia, unspecified: Secondary | ICD-10-CM

## 2022-01-23 DIAGNOSIS — I10 Essential (primary) hypertension: Secondary | ICD-10-CM | POA: Diagnosis not present

## 2022-01-23 DIAGNOSIS — E876 Hypokalemia: Secondary | ICD-10-CM

## 2022-01-23 DIAGNOSIS — I5022 Chronic systolic (congestive) heart failure: Secondary | ICD-10-CM

## 2022-01-23 DIAGNOSIS — Z79899 Other long term (current) drug therapy: Secondary | ICD-10-CM

## 2022-01-23 MED ORDER — LOSARTAN POTASSIUM 50 MG PO TABS: 50.0000 mg | ORAL_TABLET | Freq: Every day | ORAL | 2 refills | Status: AC

## 2022-01-23 MED ORDER — SPIRONOLACTONE 25 MG PO TABS: 12.5000 mg | ORAL_TABLET | Freq: Every day | ORAL | 2 refills | Status: AC

## 2022-01-23 MED ORDER — EZETIMIBE 10 MG PO TABS: 10.0000 mg | ORAL_TABLET | Freq: Every day | ORAL | 2 refills | Status: AC

## 2022-01-23 MED ORDER — ATORVASTATIN CALCIUM 80 MG PO TABS: 80.0000 mg | ORAL_TABLET | Freq: Every day | ORAL | 2 refills | Status: AC

## 2022-01-23 MED ORDER — FUROSEMIDE 40 MG PO TABS: 40.0000 mg | ORAL_TABLET | Freq: Every day | ORAL | 2 refills | Status: DC

## 2022-01-23 MED ORDER — CARVEDILOL 25 MG PO TABS: 50.0000 mg | ORAL_TABLET | Freq: Two times a day (BID) | ORAL | 2 refills | Status: DC

## 2022-01-23 MED ORDER — FENOFIBRATE 145 MG PO TABS: ORAL_TABLET | ORAL | 2 refills | Status: AC

## 2022-01-23 MED ORDER — POTASSIUM CHLORIDE ER 10 MEQ PO TBCR: 10.0000 meq | EXTENDED_RELEASE_TABLET | Freq: Every day | ORAL | 2 refills | Status: AC

## 2022-01-23 MED ORDER — DAPAGLIFLOZIN PROPANEDIOL 10 MG PO TABS: 10.0000 mg | ORAL_TABLET | Freq: Every day | ORAL | 5 refills | Status: AC

## 2022-01-23 NOTE — Progress Notes (Signed)
?Cardiology Office Note:   ? ?Date:  01/23/2022  ? ?ID:  Jeff Cobb, DOB [birth year], MRN [id number] ? ?PCP:  Pcp, No  ?[facility] Cardiologist:  Nelva Bush, MD  ?[place] Electrophysiologist:  None  ? ?Referring MD: Luciana Axe, NP  ? ?Chief Complaint: 3 month follow-up ? ?History of Present Illness:   ? ?Jeff Cobb is a 51 y.o. male with a hx of of CAD s/p prior LAD stenting in 2016, ischemic cardiomyopathy with EF less than 20%, HFrEF, hypertension, hyperlipidemia who presents for 3 month follow-up.  ? ?He suffered an anterior STEMI 04/2015, requiring DES to the LAD at [facility].  He was subsequently noted to have ischemic cardiomyopathy with EF 25% at that time.  2017 echo EF 30 to 35%.  Subsequent follow-up blood work showed renal insufficiency with Entresto and spironolactone held.  He was then lost to follow-up.   ?  ?09/2018 EF less than 20%.  He was seen in clinic 11/2018 and was noted to be off of all of his medications prior to that echo.   ?  ?Seen 04/2020 with intermittent compliance with his heart failure medications over the last year.  It was unclear if he was filling his medications at [facility] versus another pharmacy.  Lab work showed hypokalemia; however, he did not obtain follow-up labs as recommended.  Seen at subsequent office visit with ongoing medication noncompliance.   ?  ?He was admitted 09/02/2120 -09/04/2020 due to acute pulmonary edema in the setting of volume overload and medication noncompliance.  He was successfully IV diuresed and discharged on atorvastatin 80 mg, carvedilol 25 mg twice daily, furosemide 40 mg daily, losartan 12.5 mg daily, spironolactone 12.5 mg daily.  Echo during admission showed LVEF less than 20%, severe LVE, mild LVH, G2 DD, RVSF mildly reduced, mild RVE, mild to moderate LAE, moderate RAE, mild MR.  When seen at follow-up in the clinic, he reported improvement in dyspnea and denied recurrent chest pain.  Losartan was increased to 25 mg daily with plan to  transition to [facility].  He was unable to transition at subsequent phone visit, given no home BP readings recorded.   ? ?Last seen 04/10/21 and was feeling better, had made some dietary changes.  ? ?Today, the patient reports he has been doing well. He has been walking more, 1 mile day. Diet has been good. Weight has been labile. No chest pain, shortness of breath, LLE, orthopnea, pnd. EKG shows ST with heart rate 107. Says he has been sick recently. Denies leg pain. He is not wanting to try [facility] again.  ?Recent echo showed persistently reduced EF <20%.  ? ?Past Medical History:  ?Diagnosis Date  ? CAD S/P percutaneous coronary angioplasty 04/2015  ? a. 04/2015 Anterior STEMI/PCI @ [facility]: LM nl, mLAD 100% (3.0x15 Xience DES), LCx nl, OM1-3 nl, dRCA 50%, PDA nl.  ? Chronic combined systolic and diastolic heart failure, NYHA class 2 ([place]) 04/2015  ? a. 12/2015 Echo: EF 30-35%; b. 09/2018 Echo: EF <20%, sev HK (was not taking meds for ~ 1 yr @ this time)  ? Hyperlipidemia   ? Hypertensive heart disease   ? Ischemic cardiomyopathy   ? a. 04/2015 Echo: EF 25%, b. 12/2015 Echo: EF 30-35%; c. 09/2018 Echo: EF <20% (not taking meds).  ? Tobacco abuse   ? ? ?Past Surgical History:  ?Procedure Laterality Date  ? CORONARY ANGIOPLASTY WITH STENT PLACEMENT Left 04/2015  ? @ [facility]: mLAD 100% --> 3.0 x 15  Xience DES  ? TRANSTHORACIC ECHOCARDIOGRAM  04/2015  ? @ [place]: EF 25%, LVH, trivial MR, dilated thoracic aorta, anteroapical and apical akinesis and severely decreased contraction overall  ? TRANSTHORACIC ECHOCARDIOGRAM  12/2015  ? Echo: EF 30-35%, mod conc LVH, mid-apicalanteroseptal, anterior, and apical AK, mildly dil LA  ? ? ?Current Medications: ?Current Meds  ?Medication Sig  ? acetaminophen (TYLENOL) 325 MG tablet Take 650 mg by mouth every 6 (six) hours as needed for mild pain, fever or headache.  ? aspirin EC 81 MG tablet Take 81 mg by mouth daily.  ? cyanocobalamin 1000 MCG tablet Take 3,000 mcg by mouth daily.  ?  dapagliflozin propanediol (FARXIGA) 10 MG TABS tablet Take 1 tablet (10 mg total) by mouth daily before breakfast.  ? Omega-3 Fatty Acids (FISH OIL PO) Take by mouth daily.  ? [DISCONTINUED] atorvastatin (LIPITOR) 80 MG tablet Take 1 tablet (80 mg total) by mouth daily.  ? [DISCONTINUED] carvedilol (COREG) 25 MG tablet Take 2 tablets (50 mg total) by mouth 2 (two) times daily.  ? [DISCONTINUED] ezetimibe (ZETIA) 10 MG tablet Take 1 tablet (10 mg total) by mouth daily.  ? [DISCONTINUED] fenofibrate (TRICOR) 145 MG tablet TAKE 1 TABLET(145 MG) BY MOUTH DAILY  ? [DISCONTINUED] furosemide (LASIX) 40 MG tablet Take 1 tablet (40 mg total) by mouth daily.  ? [DISCONTINUED] losartan (COZAAR) 50 MG tablet Take 1 tablet (50 mg total) by mouth daily.  ? [DISCONTINUED] potassium chloride (KLOR-CON) 10 MEQ tablet Take 1 tablet (10 mEq total) by mouth daily.  ? [DISCONTINUED] spironolactone (ALDACTONE) 25 MG tablet Take 0.5 tablets (12.5 mg total) by mouth daily.  ?  ? ?Allergies:   Patient has no known allergies.  ? ?Social History  ? ?Socioeconomic History  ? Marital status: Married  ?  Spouse name: Not on file  ? Number of children: Not on file  ? Years of education: Not on file  ? Highest education level: Not on file  ?Occupational History  ? Occupation: unemployed  ?Tobacco Use  ? Smoking status: Some Days  ?  Packs/day: 0.10  ?  Types: Cigarettes  ? Smokeless tobacco: Never  ?Vaping Use  ? Vaping Use: Never used  ?Substance and Sexual Activity  ? Alcohol use: Yes  ?  Alcohol/week: 2.0 standard drinks  ?  Types: 2 Shots of liquor per week  ?  Comment: daily  ? Drug use: Yes  ?  Types: Marijuana  ? Sexual activity: Not on file  ?Other Topics Concern  ? Not on file  ?Social History Narrative  ? Not on file  ? ?Social Determinants of Health  ? ?Financial Resource Strain: Not on file  ?Food Insecurity: Not on file  ?Transportation Needs: Not on file  ?Physical Activity: Not on file  ?Stress: Not on file  ?Social Connections: Not  on file  ?  ? ?Family History: ?The patient's family history includes Diabetes Mellitus II in his father; Heart attack in his father; Heart disease in his father. ? ?ROS:   ?Please see the history of present illness.    ? All other systems reviewed and are negative. ? ?EKGs/Labs/Other Studies Reviewed:   ? ?The following studies were reviewed today: ? ?Echo 01/21/2022 ?1. Left ventricular ejection fraction, by estimation, is <20%. Left  ?ventricular ejection fraction by 2D MOD biplane is 18.7 %. The left  ?ventricle has severely decreased function. The left ventricle demonstrates  ?global hypokinesis. The left ventricular  ?internal cavity size was severely dilated.  Left ventricular diastolic  ?parameters are indeterminate.  ? 2. Right ventricular systolic function is moderately reduced. The right  ?ventricular size is normal.  ? 3. Left atrial size was mildly dilated.  ? 4. The mitral valve is normal in structure. Mild mitral valve  ?regurgitation.  ? 5. The aortic valve is tricuspid. Aortic valve regurgitation is not  ?visualized.  ? 6. The inferior vena cava is normal in size with greater than 50%  ?respiratory variability, suggesting right atrial pressure of 3 mmHg.  ? ?Comparison(s): LVEF <20%.  ? ?Echo 08/2020 ? ? 1. Left ventricular ejection fraction, by estimation, is <20%. The left  ?ventricle has severely decreased function. The left ventricle demonstrates  ?global hypokinesis. The left ventricular internal cavity size was severely  ?dilated. There is mild left  ?ventricular hypertrophy. Left ventricular diastolic parameters are  ?consistent with Grade II diastolic dysfunction (pseudonormalization).  ?Elevated left atrial pressure.  ? 2. Right ventricular systolic function is mildly reduced. The right  ?ventricular size is mildly enlarged. Tricuspid regurgitation signal is  ?inadequate for assessing PA pressure.  ? 3. Left atrial size was mild to moderately dilated.  ? 4. Right atrial size was moderately  dilated.  ? 5. The mitral valve is grossly normal. Mild mitral valve regurgitation.  ?No evidence of mitral stenosis.  ? 6. The aortic valve is tricuspid. Aortic valve regurgitation is not  ?visualized. No aortic s

## 2022-01-23 NOTE — Patient Instructions (Addendum)
Medication Instructions:  ?Your physician has recommended you make the following change in your medication:  ? ?START Farxiga 10 mg daily. An Rx has been sent to your pharmacy ? ?You cardiac medications have been refilled today. ? ?*If you need a refill on your cardiac medications before your next appointment, please call your pharmacy* ? ? ?Lab Work: ?Cmp, Cbc, Tsh, mag, Direct LDL today ? ?Your physician recommends that you return for lab work (Bmp) in: 1-2 weeks ? ?Please have your labs drawn at the [facility]. Stop at the Registration desk to check in. No appt needed. Lab hours are Mon-Fri 7am-6pm. ? ?If you have labs (blood work) drawn today and your tests are completely normal, you will receive your results only by: ?MyChart Message (if you have MyChart) OR ?A paper copy in the mail ?If you have any lab test that is abnormal or we need to change your treatment, we will call you to review the results. ? ? ?Testing/Procedures: ?None ordered ? ? ?Follow-Up: ?At [facility], you and your health needs are our priority.  As part of our continuing mission to provide you with exceptional heart care, we have created designated Provider Care Teams.  These Care Teams include your primary Cardiologist (physician) and Advanced Practice Providers (APPs -  Physician Assistants and Nurse Practitioners) who all work together to provide you with the care you need, when you need it. ? ?We recommend signing up for the patient portal called "MyChart".  Sign up information is provided on this After Visit Summary.  MyChart is used to connect with patients for Virtual Visits (Telemedicine).  Patients are able to view lab/test results, encounter notes, upcoming appointments, etc.  Non-urgent messages can be sent to your provider as well.   ?To learn more about what you can do with MyChart, go to [email or web address].   ? ?Your next appointment:   ?3 month(s) ? ?The format for your next appointment:   ?In  Person ? ?Provider:   ?You may see Nelva Bush, MD or one of the following Advanced Practice Providers on your designated Care Team:   ?Murray Hodgkins, NP ?Christell Faith, PA-C ?Cadence Kathlen Mody, PA-C ? ?You have been referred to EP for ICD consideration  ? ?Other Instructions ?N/A ? ?Important Information About Sugar ? ? ? ? ? ? ?

## 2022-01-29 ENCOUNTER — Emergency Department (HOSPITAL_COMMUNITY): Payer: 59

## 2022-01-29 ENCOUNTER — Observation Stay (HOSPITAL_COMMUNITY)
Admission: EM | Admit: 2022-01-29 | Discharge: 2022-01-30 | Disposition: A | Discharge status: Home / Self Care | Payer: 59 | Attending: Internal Medicine | Admitting: Internal Medicine

## 2022-01-29 ENCOUNTER — Observation Stay (HOSPITAL_COMMUNITY): Payer: 59

## 2022-01-29 ENCOUNTER — Other Ambulatory Visit: Payer: Self-pay

## 2022-01-29 DIAGNOSIS — N179 Acute kidney failure, unspecified: Secondary | ICD-10-CM | POA: Insufficient documentation

## 2022-01-29 DIAGNOSIS — D649 Anemia, unspecified: Secondary | ICD-10-CM | POA: Diagnosis not present

## 2022-01-29 DIAGNOSIS — S01511A Laceration without foreign body of lip, initial encounter: Secondary | ICD-10-CM | POA: Diagnosis present

## 2022-01-29 DIAGNOSIS — D3502 Benign neoplasm of left adrenal gland: Secondary | ICD-10-CM | POA: Insufficient documentation

## 2022-01-29 DIAGNOSIS — I5022 Chronic systolic (congestive) heart failure: Secondary | ICD-10-CM | POA: Insufficient documentation

## 2022-01-29 DIAGNOSIS — I700 Atherosclerosis of aorta: Secondary | ICD-10-CM | POA: Insufficient documentation

## 2022-01-29 DIAGNOSIS — N189 Chronic kidney disease, unspecified: Secondary | ICD-10-CM | POA: Diagnosis not present

## 2022-01-29 DIAGNOSIS — Z23 Encounter for immunization: Secondary | ICD-10-CM | POA: Diagnosis not present

## 2022-01-29 DIAGNOSIS — Z79899 Other long term (current) drug therapy: Secondary | ICD-10-CM | POA: Insufficient documentation

## 2022-01-29 DIAGNOSIS — K5730 Diverticulosis of large intestine without perforation or abscess without bleeding: Secondary | ICD-10-CM | POA: Diagnosis not present

## 2022-01-29 DIAGNOSIS — F17210 Nicotine dependence, cigarettes, uncomplicated: Secondary | ICD-10-CM | POA: Diagnosis not present

## 2022-01-29 DIAGNOSIS — I2510 Atherosclerotic heart disease of native coronary artery without angina pectoris: Secondary | ICD-10-CM | POA: Diagnosis not present

## 2022-01-29 DIAGNOSIS — K4090 Unilateral inguinal hernia, without obstruction or gangrene, not specified as recurrent: Secondary | ICD-10-CM | POA: Insufficient documentation

## 2022-01-29 DIAGNOSIS — S022XXA Fracture of nasal bones, initial encounter for closed fracture: Principal | ICD-10-CM | POA: Insufficient documentation

## 2022-01-29 DIAGNOSIS — I130 Hypertensive heart and chronic kidney disease with heart failure and stage 1 through stage 4 chronic kidney disease, or unspecified chronic kidney disease: Secondary | ICD-10-CM | POA: Diagnosis not present

## 2022-01-29 DIAGNOSIS — Z7982 Long term (current) use of aspirin: Secondary | ICD-10-CM | POA: Diagnosis not present

## 2022-01-29 LAB — PHOSPHORUS: Phosphorus: 3.6 mg/dL (ref 2.5–4.6)

## 2022-01-29 LAB — BASIC METABOLIC PANEL
Anion gap: 13 (ref 5–15)
BUN: 31 mg/dL — ABNORMAL HIGH (ref 6–20)
CO2: 24 mmol/L (ref 22–32)
Calcium: 8.8 mg/dL — ABNORMAL LOW (ref 8.9–10.3)
Chloride: 100 mmol/L (ref 98–111)
Creatinine, Ser: 1.75 mg/dL — ABNORMAL HIGH (ref 0.61–1.24)
GFR, Estimated: 47 mL/min — ABNORMAL LOW (ref >60)
Glucose, Bld: 102 mg/dL — ABNORMAL HIGH (ref 70–99)
Potassium: 3.4 mmol/L — ABNORMAL LOW (ref 3.5–5.1)
Sodium: 137 mmol/L (ref 135–145)

## 2022-01-29 LAB — COMPREHENSIVE METABOLIC PANEL
ALT: 17 U/L (ref 0–44)
AST: 21 U/L (ref 15–41)
Albumin: 3.2 g/dL — ABNORMAL LOW (ref 3.5–5.0)
Alkaline Phosphatase: 54 U/L (ref 38–126)
Anion gap: 15 (ref 5–15)
BUN: 40 mg/dL — ABNORMAL HIGH (ref 6–20)
CO2: 22 mmol/L (ref 22–32)
Calcium: 9.1 mg/dL (ref 8.9–10.3)
Chloride: 95 mmol/L — ABNORMAL LOW (ref 98–111)
Creatinine, Ser: 2.49 mg/dL — ABNORMAL HIGH (ref 0.61–1.24)
GFR, Estimated: 31 mL/min — ABNORMAL LOW (ref >60)
Glucose, Bld: 110 mg/dL — ABNORMAL HIGH (ref 70–99)
Potassium: 2.7 mmol/L — CL (ref 3.5–5.1)
Sodium: 132 mmol/L — ABNORMAL LOW (ref 135–145)
Total Bilirubin: 1.1 mg/dL (ref 0.3–1.2)
Total Protein: 7.2 g/dL (ref 6.5–8.1)

## 2022-01-29 LAB — CBC
HCT: 34.3 % — ABNORMAL LOW (ref 39.0–52.0)
Hemoglobin: 12.4 g/dL — ABNORMAL LOW (ref 13.0–17.0)
MCH: 31.1 pg (ref 26.0–34.0)
MCHC: 36.2 g/dL — ABNORMAL HIGH (ref 30.0–36.0)
MCV: 86 fL (ref 80.0–100.0)
Platelets: 374 10*3/uL (ref 150–400)
RBC: 3.99 MIL/uL — ABNORMAL LOW (ref 4.22–5.81)
RDW: 14.4 % (ref 11.5–15.5)
WBC: 11.9 10*3/uL — ABNORMAL HIGH (ref 4.0–10.5)
nRBC: 0 % (ref 0.0–0.2)

## 2022-01-29 LAB — I-STAT CHEM 8, ED
BUN: 40 mg/dL — ABNORMAL HIGH (ref 6–20)
Calcium, Ion: 1.02 mmol/L — ABNORMAL LOW (ref 1.15–1.40)
Chloride: 95 mmol/L — ABNORMAL LOW (ref 98–111)
Creatinine, Ser: 2.8 mg/dL — ABNORMAL HIGH (ref 0.61–1.24)
Glucose, Bld: 114 mg/dL — ABNORMAL HIGH (ref 70–99)
HCT: 37 % — ABNORMAL LOW (ref 39.0–52.0)
Hemoglobin: 12.6 g/dL — ABNORMAL LOW (ref 13.0–17.0)
Potassium: 2.7 mmol/L — CL (ref 3.5–5.1)
Sodium: 131 mmol/L — ABNORMAL LOW (ref 135–145)
TCO2: 23 mmol/L (ref 22–32)

## 2022-01-29 LAB — HIV ANTIBODY (ROUTINE TESTING W REFLEX): HIV Screen 4th Generation wRfx: NONREACTIVE

## 2022-01-29 LAB — HEMOGLOBIN A1C
Hgb A1c MFr Bld: 6.5 % — ABNORMAL HIGH (ref 4.8–5.6)
Mean Plasma Glucose: 139.85 mg/dL

## 2022-01-29 LAB — MAGNESIUM: Magnesium: 1.4 mg/dL — ABNORMAL LOW (ref 1.7–2.4)

## 2022-01-29 MED ORDER — POTASSIUM CHLORIDE CRYS ER 20 MEQ PO TBCR
20.0000 meq | EXTENDED_RELEASE_TABLET | Freq: Once | ORAL | Status: AC
  Administered 2022-01-29: 20 meq via ORAL
  Filled 2022-01-29: qty 1

## 2022-01-29 MED ORDER — HYDROMORPHONE HCL 1 MG/ML IJ SOLN
1.0000 mg | INTRAMUSCULAR | Status: DC | PRN
  Administered 2022-01-29: 1 mg via INTRAVENOUS
  Filled 2022-01-29: qty 1

## 2022-01-29 MED ORDER — MORPHINE SULFATE (PF) 4 MG/ML IV SOLN
4.0000 mg | Freq: Once | INTRAVENOUS | Status: AC
  Administered 2022-01-29: 4 mg via INTRAVENOUS
  Filled 2022-01-29: qty 1

## 2022-01-29 MED ORDER — POTASSIUM CHLORIDE CRYS ER 20 MEQ PO TBCR
40.0000 meq | EXTENDED_RELEASE_TABLET | Freq: Once | ORAL | Status: AC
  Administered 2022-01-29: 40 meq via ORAL
  Filled 2022-01-29: qty 2

## 2022-01-29 MED ORDER — ACETAMINOPHEN 650 MG RE SUPP: 650.0000 mg | Freq: Four times a day (QID) | RECTAL | Status: DC | PRN

## 2022-01-29 MED ORDER — LIDOCAINE-EPINEPHRINE 1 %-1:100000 IJ SOLN
30.0000 mL | Freq: Once | INTRAMUSCULAR | Status: AC
  Administered 2022-01-29: 30 mL
  Filled 2022-01-29: qty 1

## 2022-01-29 MED ORDER — ENOXAPARIN SODIUM 40 MG/0.4ML IJ SOSY
40.0000 mg | PREFILLED_SYRINGE | INTRAMUSCULAR | Status: DC
  Administered 2022-01-29: 40 mg via SUBCUTANEOUS
  Filled 2022-01-29: qty 0.4

## 2022-01-29 MED ORDER — TETANUS-DIPHTH-ACELL PERTUSSIS 5-2.5-18.5 LF-MCG/0.5 IM SUSY
0.5000 mL | PREFILLED_SYRINGE | Freq: Once | INTRAMUSCULAR | Status: AC
  Administered 2022-01-29: 0.5 mL via INTRAMUSCULAR
  Filled 2022-01-29: qty 0.5

## 2022-01-29 MED ORDER — OXYCODONE HCL 5 MG PO TABS
5.0000 mg | ORAL_TABLET | ORAL | Status: DC | PRN
  Administered 2022-01-30: 5 mg via ORAL
  Filled 2022-01-29: qty 1

## 2022-01-29 MED ORDER — ENOXAPARIN SODIUM 30 MG/0.3ML IJ SOSY: 30.0000 mg | PREFILLED_SYRINGE | INTRAMUSCULAR | Status: DC

## 2022-01-29 MED ORDER — ONDANSETRON HCL 4 MG/2ML IJ SOLN: 4.0000 mg | Freq: Four times a day (QID) | INTRAMUSCULAR | Status: DC | PRN

## 2022-01-29 MED ORDER — ASPIRIN EC 81 MG PO TBEC
81.0000 mg | DELAYED_RELEASE_TABLET | Freq: Every day | ORAL | Status: DC
  Administered 2022-01-30: 81 mg via ORAL
  Filled 2022-01-29: qty 1

## 2022-01-29 MED ORDER — SODIUM CHLORIDE 0.9 % IV SOLN: Freq: Once | INTRAVENOUS | Status: AC

## 2022-01-29 MED ORDER — BACITRACIN ZINC 500 UNIT/GM EX OINT
TOPICAL_OINTMENT | Freq: Two times a day (BID) | CUTANEOUS | Status: DC
  Administered 2022-01-29 – 2022-01-30 (×2): 31.5 via TOPICAL
  Filled 2022-01-29: qty 28.35

## 2022-01-29 MED ORDER — ATORVASTATIN CALCIUM 80 MG PO TABS
80.0000 mg | ORAL_TABLET | Freq: Every day | ORAL | Status: DC
  Administered 2022-01-30: 80 mg via ORAL
  Filled 2022-01-29: qty 1

## 2022-01-29 MED ORDER — AMOXICILLIN-POT CLAVULANATE 875-125 MG PO TABS
1.0000 | ORAL_TABLET | Freq: Two times a day (BID) | ORAL | Status: DC
  Administered 2022-01-29 – 2022-01-30 (×2): 1 via ORAL
  Filled 2022-01-29 (×2): qty 1

## 2022-01-29 MED ORDER — MAGNESIUM SULFATE 2 GM/50ML IV SOLN
2.0000 g | Freq: Once | INTRAVENOUS | Status: AC
  Administered 2022-01-29: 2 g via INTRAVENOUS
  Filled 2022-01-29: qty 50

## 2022-01-29 MED ORDER — POTASSIUM CHLORIDE 10 MEQ/100ML IV SOLN
10.0000 meq | Freq: Once | INTRAVENOUS | Status: AC
  Administered 2022-01-29: 10 meq via INTRAVENOUS
  Filled 2022-01-29: qty 100

## 2022-01-29 MED ORDER — POTASSIUM CHLORIDE 10 MEQ/100ML IV SOLN
10.0000 meq | INTRAVENOUS | Status: AC
  Administered 2022-01-29 (×4): 10 meq via INTRAVENOUS
  Filled 2022-01-29 (×4): qty 100

## 2022-01-29 MED ORDER — NICOTINE 14 MG/24HR TD PT24
14.0000 mg | MEDICATED_PATCH | Freq: Every day | TRANSDERMAL | Status: DC
  Administered 2022-01-29 – 2022-01-30 (×2): 14 mg via TRANSDERMAL
  Filled 2022-01-29 (×2): qty 1

## 2022-01-29 MED ORDER — ONDANSETRON HCL 4 MG PO TABS: 4.0000 mg | ORAL_TABLET | Freq: Four times a day (QID) | ORAL | Status: DC | PRN

## 2022-01-29 MED ORDER — ACETAMINOPHEN 325 MG PO TABS: 650.0000 mg | ORAL_TABLET | Freq: Four times a day (QID) | ORAL | Status: DC | PRN

## 2022-01-29 MED ORDER — SODIUM CHLORIDE 0.9 % IV BOLUS: 1000.0000 mL | Freq: Once | INTRAVENOUS | Status: DC
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
## 2022-01-30 ENCOUNTER — Observation Stay (HOSPITAL_COMMUNITY): Payer: 59

## 2022-01-30 DIAGNOSIS — I110 Hypertensive heart disease with heart failure: Secondary | ICD-10-CM

## 2022-01-30 DIAGNOSIS — I5022 Chronic systolic (congestive) heart failure: Secondary | ICD-10-CM | POA: Diagnosis not present

## 2022-01-30 DIAGNOSIS — N179 Acute kidney failure, unspecified: Secondary | ICD-10-CM

## 2022-01-30 LAB — CBC
HCT: 33.4 % — ABNORMAL LOW (ref 39.0–52.0)
Hemoglobin: 11.8 g/dL — ABNORMAL LOW (ref 13.0–17.0)
MCH: 30.6 pg (ref 26.0–34.0)
MCHC: 35.3 g/dL (ref 30.0–36.0)
MCV: 86.8 fL (ref 80.0–100.0)
Platelets: 343 10*3/uL (ref 150–400)
RBC: 3.85 MIL/uL — ABNORMAL LOW (ref 4.22–5.81)
RDW: 14.6 % (ref 11.5–15.5)
WBC: 14.5 10*3/uL — ABNORMAL HIGH (ref 4.0–10.5)
nRBC: 0 % (ref 0.0–0.2)

## 2022-01-30 LAB — BASIC METABOLIC PANEL
Anion gap: 10 (ref 5–15)
BUN: 25 mg/dL — ABNORMAL HIGH (ref 6–20)
CO2: 24 mmol/L (ref 22–32)
Calcium: 8.8 mg/dL — ABNORMAL LOW (ref 8.9–10.3)
Chloride: 101 mmol/L (ref 98–111)
Creatinine, Ser: 1.54 mg/dL — ABNORMAL HIGH (ref 0.61–1.24)
GFR, Estimated: 55 mL/min — ABNORMAL LOW (ref >60)
Glucose, Bld: 108 mg/dL — ABNORMAL HIGH (ref 70–99)
Potassium: 3.8 mmol/L (ref 3.5–5.1)
Sodium: 135 mmol/L (ref 135–145)

## 2022-01-30 LAB — MAGNESIUM: Magnesium: 2.2 mg/dL (ref 1.7–2.4)

## 2022-01-30 MED ORDER — AMOXICILLIN-POT CLAVULANATE 875-125 MG PO TABS: 1.0000 | ORAL_TABLET | Freq: Two times a day (BID) | ORAL | 0 refills | Status: DC

## 2022-01-30 MED ORDER — BACITRACIN ZINC 500 UNIT/GM EX OINT: TOPICAL_OINTMENT | Freq: Two times a day (BID) | CUTANEOUS | 0 refills | Status: AC

## 2022-01-30 MED ORDER — OXYCODONE-ACETAMINOPHEN 5-325 MG PO TABS
1.0000 | ORAL_TABLET | Freq: Four times a day (QID) | ORAL | 0 refills | Status: AC | PRN
Start: 2022-01-30 — End: 2022-02-04

## 2022-01-30 NOTE — TOC CAGE-AID Note (Signed)
Transition of Care (TOC) - CAGE-AID Screening ? ? ?Patient Details  ?Name: Jeff Cobb ?MRN: [id number] ?Date of Birth: April 07, 1971 ? ?Transition of Care (TOC) CM/SW Contact:    ?Chon Buhl C Tarpley-Carter, LCSWA ?Phone Number: ?01/30/2022, 12:01 PM ? ? ?Clinical Narrative: ?Pt participated in [place].  Pt stated he does use substance and ETOH.  Pt was offered resources, due to usage of substance and ETOH.    ? ?Passenger transport manager, MSW, LCSW-A ?Pronouns:  She/Her/Hers ?[facility] ?Clinical Social Worker ?Direct Number:  [phone] ?[email or web address] ? ?CAGE-AID Screening: ?  ? ?Have You Ever Felt You Ought to Cut Down on Your Drinking or Drug Use?: No ?Have People Annoyed You By Critizing Your Drinking Or Drug Use?: No ?Have You Felt Bad Or Guilty About Your Drinking Or Drug Use?: No ?Have You Ever Had a Drink or Used Drugs First Thing In The Morning to Steady Your Nerves or to Get Rid of a Hangover?: No ?CAGE-AID Score: 0 ? ?Substance Abuse Education Offered: Yes ? ?Substance abuse interventions: Educational Materials ? ? ? ? ? ? ?
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
## 2022-01-30 NOTE — Progress Notes (Signed)
Einar Pheasant to be D/C'd  per MD order.  Discussed with the patient and all questions fully answered. ? ?VSS, Skin clean, dry and intact without evidence of skin break down, no evidence of skin tears noted. ? ?IV catheter discontinued intact. Site without signs and symptoms of complications. Dressing and pressure applied. ? ?An After Visit Summary was printed and given to the patient. ? ?D/c re-educated completed with patient/family including follow up instructions, medication list, d/c activities limitations if indicated, with other d/c instructions as indicated by MD - patient able to verbalize understanding, all questions fully answered.  ? ?Patient instructed to return to ED, call 911, or call MD for any changes in condition.  ? ?Patient to be escorted via [place], and D/C home via private auto.  ?

## 2022-01-30 NOTE — Plan of Care (Signed)

## 2022-02-04 ENCOUNTER — Telehealth: Payer: Self-pay | Admitting: Internal Medicine

## 2022-02-04 NOTE — Telephone Encounter (Signed)
Needed letter for work. Did not have follow up appointment scheduled, we scheduled follow up with our office. ?

## 2022-02-10 NOTE — Progress Notes (Unsigned)
Electrophysiology Office Note:    Date:  02/10/2022   ID:  Jeff Cobb, DOB 06/11/1971, MRN [id number]  PCP:  Merryl Hacker, No  [facility] Cardiologist:  Nelva Bush, MD  [facility] Electrophysiologist:  None   Referring MD: Antony Madura, PA-C   Chief Complaint: Cardiomyopathy  History of Present Illness:    Jeff Cobb is a 51 y.o. male who presents for an evaluation of cardiomyopathy at the request of Eulis Foster, [place]. Their medical history includes chronic systolic heart failure, coronary artery disease, STEMI in 2016.  The patient had a STEMI in August 2016 and had a drug-eluting stent placed to the LAD at [facility].  He has had trouble with compliance with his medication regimen in the past.  At the last appointment with cadence, he was back on medications and was referred to discuss ICD implant.     Past Medical History:  Diagnosis Date   CAD S/P percutaneous coronary angioplasty 04/2015   a. 04/2015 Anterior STEMI/PCI @ [facility]: LM nl, mLAD 100% (3.0x15 Xience DES), LCx nl, OM1-3 nl, dRCA 50%, PDA nl.   Chronic combined systolic and diastolic heart failure, NYHA class 2 ([place]) 04/2015   a. 12/2015 Echo: EF 30-35%; b. 09/2018 Echo: EF <20%, sev HK (was not taking meds for ~ 1 yr @ this time)   Hyperlipidemia    Hypertensive heart disease    Ischemic cardiomyopathy    a. 04/2015 Echo: EF 25%, b. 12/2015 Echo: EF 30-35%; c. 09/2018 Echo: EF <20% (not taking meds).   Tobacco abuse     Past Surgical History:  Procedure Laterality Date   CORONARY ANGIOPLASTY WITH STENT PLACEMENT Left 04/2015   @ [place]: mLAD 100% --> 3.0 x 15 Xience DES   TRANSTHORACIC ECHOCARDIOGRAM  04/2015   @ [facility]: EF 25%, LVH, trivial MR, dilated thoracic aorta, anteroapical and apical akinesis and severely decreased contraction overall   TRANSTHORACIC ECHOCARDIOGRAM  12/2015   Echo: EF 30-35%, mod conc LVH, mid-apicalanteroseptal, anterior, and apical AK, mildly dil LA    Current Medications: No  outpatient medications have been marked as taking for the 02/11/22 encounter (Appointment) with Vickie Epley, MD.     Allergies:   Patient has no known allergies.   Social History   Socioeconomic History   Marital status: Married    Spouse name: Not on file   Number of children: Not on file   Years of education: Not on file   Highest education level: Not on file  Occupational History   Occupation: unemployed  Tobacco Use   Smoking status: Some Days    Packs/day: 0.10    Types: Cigarettes   Smokeless tobacco: Never  Vaping Use   Vaping Use: Never used  Substance and Sexual Activity   Alcohol use: Yes    Alcohol/week: 2.0 standard drinks    Types: 2 Shots of liquor per week    Comment: daily   Drug use: Yes    Types: Marijuana   Sexual activity: Not on file  Other Topics Concern   Not on file  Social History Narrative   Not on file   Social Determinants of Health   Financial Resource Strain: Not on file  Food Insecurity: Not on file  Transportation Needs: Not on file  Physical Activity: Not on file  Stress: Not on file  Social Connections: Not on file     Family History: The patient's family history includes Diabetes Mellitus II in his father; Heart attack in  his father; Heart disease in his father.  ROS:   Please see the history of present illness.    All other systems reviewed and are negative.  EKGs/Labs/Other Studies Reviewed:    The following studies were reviewed today:   Jan 21, 2022 echo Left ventricular function severely reduced, less than 20% Right ventricular function moderately reduced Mildly dilated left atrium Mild MR   Jan 23, 2022 EKG shows sinus rhythm and a narrow QRS     Recent Labs: 01/29/2022: ALT 17 01/30/2022: BUN 25; Creatinine, Ser 1.54; Hemoglobin 11.8; Magnesium 2.2; Platelets 343; Potassium 3.8; Sodium 135  Recent Lipid Panel    Component Value Date/Time   CHOL 215 (H) 02/19/2021 1533   TRIG 125 02/19/2021 1533    HDL 78 02/19/2021 1533   CHOLHDL 2.8 02/19/2021 1533   CHOLHDL NOT REPORTED DUE TO HIGH TRIGLYCERIDES 03/16/2017 0734   VLDL UNABLE TO CALCULATE IF TRIGLYCERIDE OVER 400 mg/dL 03/16/2017 0734   LDLCALC 115 (H) 02/19/2021 1533   LDLDIRECT 70 12/23/2016 0725    Physical Exam:    VS:  There were no vitals taken for this visit.    Wt Readings from Last 3 Encounters:  01/29/22 202 lb 13.2 oz (92 kg)  01/23/22 203 lb (92.1 kg)  04/10/21 197 lb (89.4 kg)     GEN: *** Well nourished, well developed in no acute distress HEENT: Normal NECK: No JVD; No carotid bruits LYMPHATICS: No lymphadenopathy CARDIAC: ***RRR, no murmurs, rubs, gallops RESPIRATORY:  Clear to auscultation without rales, wheezing or rhonchi  ABDOMEN: Soft, non-tender, non-distended MUSCULOSKELETAL:  No edema; No deformity  SKIN: Warm and dry NEUROLOGIC:  Alert and oriented x 3 PSYCHIATRIC:  Normal affect       ASSESSMENT:    No diagnosis found. PLAN:    In order of problems listed above:  #Chronic systolic heart failure #Ischemic cardiomyopathy #Coronary artery disease post STEMI  VDD ICD Cardiac MRI prior to implant       Total time spent with patient today *** minutes. This includes reviewing records, evaluating the patient and coordinating care.  Medication Adjustments/Labs and Tests Ordered: Current medicines are reviewed at length with the patient today.  Concerns regarding medicines are outlined above.  No orders of the defined types were placed in this encounter.  No orders of the defined types were placed in this encounter.    Signed, Hilton Cork. Quentin Ore, MD, [facility], [facility] 02/10/2022 9:54 PM    Electrophysiology  [facility]

## 2022-02-11 ENCOUNTER — Emergency Department (HOSPITAL_COMMUNITY)
Admission: EM | Admit: 2022-02-11 | Discharge: 2022-02-11 | Disposition: A | Discharge status: Home / Self Care | Payer: 59 | Attending: Emergency Medicine | Admitting: Emergency Medicine

## 2022-02-11 ENCOUNTER — Ambulatory Visit: Payer: 59 | Admitting: Student

## 2022-02-11 ENCOUNTER — Ambulatory Visit: Payer: 59 | Admitting: Cardiology

## 2022-02-11 DIAGNOSIS — I10 Essential (primary) hypertension: Secondary | ICD-10-CM

## 2022-02-11 DIAGNOSIS — Z09 Encounter for follow-up examination after completed treatment for conditions other than malignant neoplasm: Secondary | ICD-10-CM | POA: Diagnosis present

## 2022-02-11 DIAGNOSIS — Z5321 Procedure and treatment not carried out due to patient leaving prior to being seen by health care provider: Secondary | ICD-10-CM | POA: Diagnosis not present

## 2022-02-11 DIAGNOSIS — I255 Ischemic cardiomyopathy: Secondary | ICD-10-CM

## 2022-02-11 DIAGNOSIS — I5042 Chronic combined systolic (congestive) and diastolic (congestive) heart failure: Secondary | ICD-10-CM

## 2022-02-11 DIAGNOSIS — I25118 Atherosclerotic heart disease of native coronary artery with other forms of angina pectoris: Secondary | ICD-10-CM

## 2022-02-11 NOTE — ED Provider Notes (Signed)
No charge note  Patient was scheduled today for a follow-up appointment with internal medicine teaching service clinic. He has no complaints, stating that he is only here for a follow-up.  He states that he thought that for this follow-up check and he came in through the emergency room.  Triage had already been initiated by the time this was discovered and reportedly this unintentional encounter can not be undone by registration.   Patient states that he does not wish to be seen in the emergency room as he does not have any complaints.  Patient is awake and alert.  He is ambulatory in no distress.  States he needs a work note and has paperwork for insurance.  I called down to internal medicine teaching service and secure chatted with the provider he was scheduled to see who reports that he needs to be rescheduled.  Patient was brought down to internal medicine teaching service to reschedule and to ensure he knows the correct location to go in the future.  No charge note.   Note: Portions of this report may have been transcribed using voice recognition software. Every effort was made to ensure accuracy; however, inadvertent computerized transcription errors may be present      Ollen Gross 02/11/22 [place], Kevin, MD 02/12/22 [phone]

## 2022-02-11 NOTE — ED Triage Notes (Signed)
Pt arrived POV from home stating he needed a follow up but has no actual complaints, but is actually suppose to be downstairs in internal med.

## 2022-02-12 ENCOUNTER — Encounter: Payer: Self-pay | Admitting: Cardiology

## 2022-02-16 ENCOUNTER — Encounter: Payer: Self-pay | Admitting: Internal Medicine

## 2022-02-16 DIAGNOSIS — I129 Hypertensive chronic kidney disease with stage 1 through stage 4 chronic kidney disease, or unspecified chronic kidney disease: Secondary | ICD-10-CM | POA: Insufficient documentation

## 2022-02-16 DIAGNOSIS — N183 Chronic kidney disease, stage 3 unspecified: Secondary | ICD-10-CM | POA: Insufficient documentation

## 2022-02-16 NOTE — Progress Notes (Unsigned)
New Patient Office Visit  Subjective    Patient ID: Jeff Cobb, male    DOB: [birth year]  Age: 51 y.o. MRN: [id number]  CC: No chief complaint on file.   HPI Jeff Cobb presents to establish care ***  Outpatient Encounter Medications as of 02/18/2022  Medication Sig  . acetaminophen (TYLENOL) 325 MG tablet Take 650 mg by mouth every 6 (six) hours as needed for mild pain, fever or headache.  Marland Kitchen amoxicillin-clavulanate (AUGMENTIN) 875-125 MG tablet Take 1 tablet by mouth every 12 (twelve) hours.  Marland Kitchen aspirin EC 81 MG tablet Take 81 mg by mouth daily.  Marland Kitchen atorvastatin (LIPITOR) 80 MG tablet Take 1 tablet (80 mg total) by mouth daily.  . bacitracin ointment Apply topically 2 (two) times daily. For additional 24 hours  . carvedilol (COREG) 25 MG tablet Take 2 tablets (50 mg total) by mouth 2 (two) times daily.  . cyanocobalamin 1000 MCG tablet Take 3,000 mcg by mouth daily. (Patient not taking: Reported on 01/29/2022)  . dapagliflozin propanediol (FARXIGA) 10 MG TABS tablet Take 1 tablet (10 mg total) by mouth daily before breakfast. (Patient not taking: Reported on 01/29/2022)  . ezetimibe (ZETIA) 10 MG tablet Take 1 tablet (10 mg total) by mouth daily.  . fenofibrate (TRICOR) 145 MG tablet TAKE 1 TABLET(145 MG) BY MOUTH DAILY (Patient not taking: Reported on 01/29/2022)  . furosemide (LASIX) 40 MG tablet Take 1 tablet (40 mg total) by mouth daily.  Marland Kitchen losartan (COZAAR) 50 MG tablet Take 1 tablet (50 mg total) by mouth daily.  . Omega-3 Fatty Acids (FISH OIL PO) Take 1 capsule by mouth daily.  . potassium chloride (KLOR-CON) 10 MEQ tablet Take 1 tablet (10 mEq total) by mouth daily.  Marland Kitchen spironolactone (ALDACTONE) 25 MG tablet Take 0.5 tablets (12.5 mg total) by mouth daily.   No facility-administered encounter medications on file as of 02/18/2022.    Past Medical History:  Diagnosis Date  . CAD S/P percutaneous coronary angioplasty 04/2015   a. 04/2015 Anterior STEMI/PCI @ [facility]: LM nl, mLAD  100% (3.0x15 Xience DES), LCx nl, OM1-3 nl, dRCA 50%, PDA nl.  . Chronic combined systolic and diastolic heart failure, NYHA class 2 ([place]) 04/2015   a. 12/2015 Echo: EF 30-35%; b. 09/2018 Echo: EF <20%, sev HK (was not taking meds for ~ 1 yr @ this time)  . Hyperlipidemia   . Hypertensive heart disease   . Ischemic cardiomyopathy    a. 04/2015 Echo: EF 25%, b. 12/2015 Echo: EF 30-35%; c. 09/2018 Echo: EF <20% (not taking meds).  . Tobacco abuse     Past Surgical History:  Procedure Laterality Date  . CORONARY ANGIOPLASTY WITH STENT PLACEMENT Left 04/2015   @ [facility]: mLAD 100% --> 3.0 x 15 Xience DES  . TRANSTHORACIC ECHOCARDIOGRAM  04/2015   @ [facility]: EF 25%, LVH, trivial MR, dilated thoracic aorta, anteroapical and apical akinesis and severely decreased contraction overall  . TRANSTHORACIC ECHOCARDIOGRAM  12/2015   Echo: EF 30-35%, mod conc LVH, mid-apicalanteroseptal, anterior, and apical AK, mildly dil LA    Family History  Problem Relation Age of Onset  . Heart disease Father   . Diabetes Mellitus II Father   . Heart attack Father     Social History   Socioeconomic History  . Marital status: Married    Spouse name: Not on file  . Number of children: Not on file  . Years of education: Not on file  . Highest education level: Not on  file  Occupational History  . Occupation: unemployed  Tobacco Use  . Smoking status: Some Days    Packs/day: 0.10    Types: Cigarettes  . Smokeless tobacco: Never  Vaping Use  . Vaping Use: Never used  Substance and Sexual Activity  . Alcohol use: Yes    Alcohol/week: 2.0 standard drinks    Types: 2 Shots of liquor per week    Comment: daily  . Drug use: Yes    Types: Marijuana  . Sexual activity: Not on file  Other Topics Concern  . Not on file  Social History Narrative  . Not on file   Social Determinants of Health   Financial Resource Strain: Not on file  Food Insecurity: Not on file  Transportation Needs: Not on file  Physical  Activity: Not on file  Stress: Not on file  Social Connections: Not on file  Intimate Partner Violence: Not on file    ROS      Objective    There were no vitals taken for this visit.  Physical Exam  {Labs (Optional):23779}    Assessment & Plan:   Problem List Items Addressed This Visit   None   No follow-ups on file.   Mitzi Hansen, MD

## 2022-02-18 ENCOUNTER — Other Ambulatory Visit: Payer: Self-pay

## 2022-02-18 ENCOUNTER — Ambulatory Visit (INDEPENDENT_AMBULATORY_CARE_PROVIDER_SITE_OTHER): Payer: 59 | Admitting: Internal Medicine

## 2022-02-18 ENCOUNTER — Encounter: Payer: Self-pay | Admitting: Internal Medicine

## 2022-02-18 VITALS — BP 117/81 | HR 112 | Temp 98.4°F | Ht 67.0 in | Wt 201.9 lb

## 2022-02-18 DIAGNOSIS — D649 Anemia, unspecified: Secondary | ICD-10-CM | POA: Diagnosis not present

## 2022-02-18 DIAGNOSIS — E876 Hypokalemia: Secondary | ICD-10-CM

## 2022-02-18 DIAGNOSIS — E1122 Type 2 diabetes mellitus with diabetic chronic kidney disease: Secondary | ICD-10-CM | POA: Diagnosis not present

## 2022-02-18 DIAGNOSIS — D638 Anemia in other chronic diseases classified elsewhere: Secondary | ICD-10-CM

## 2022-02-18 DIAGNOSIS — Z9189 Other specified personal risk factors, not elsewhere classified: Secondary | ICD-10-CM

## 2022-02-18 DIAGNOSIS — I129 Hypertensive chronic kidney disease with stage 1 through stage 4 chronic kidney disease, or unspecified chronic kidney disease: Secondary | ICD-10-CM

## 2022-02-18 DIAGNOSIS — E782 Mixed hyperlipidemia: Secondary | ICD-10-CM

## 2022-02-18 DIAGNOSIS — N1831 Chronic kidney disease, stage 3a: Secondary | ICD-10-CM

## 2022-02-18 DIAGNOSIS — I5022 Chronic systolic (congestive) heart failure: Secondary | ICD-10-CM

## 2022-02-18 DIAGNOSIS — I130 Hypertensive heart and chronic kidney disease with heart failure and stage 1 through stage 4 chronic kidney disease, or unspecified chronic kidney disease: Secondary | ICD-10-CM

## 2022-02-18 DIAGNOSIS — I10 Essential (primary) hypertension: Secondary | ICD-10-CM

## 2022-02-18 DIAGNOSIS — F17210 Nicotine dependence, cigarettes, uncomplicated: Secondary | ICD-10-CM

## 2022-02-18 DIAGNOSIS — Z79899 Other long term (current) drug therapy: Secondary | ICD-10-CM

## 2022-02-18 DIAGNOSIS — F17200 Nicotine dependence, unspecified, uncomplicated: Secondary | ICD-10-CM

## 2022-02-18 DIAGNOSIS — Z7984 Long term (current) use of oral hypoglycemic drugs: Secondary | ICD-10-CM

## 2022-02-18 MED ORDER — FUROSEMIDE 40 MG PO TABS: 40.0000 mg | ORAL_TABLET | Freq: Every day | ORAL | 1 refills | Status: AC

## 2022-02-18 NOTE — Assessment & Plan Note (Signed)
Newly diagnosed during a recent hospital admission. A1C 6.5. He was recently started on Farxiga so no additional agents were added today. Reviewed disease physiology, complications, and treatment including pharmacological and lifestyle.  --Continue Farxiga '10mg'$  daily --Annual foot exam performed today --Ophthalmology referral placed for retinopathy screening --Urine microalbumin/creatinine obtained --Follow up in 18mo If A1C remains well controlled at that time, can consider extending his diabetic visits out to 638mo

## 2022-02-18 NOTE — Assessment & Plan Note (Signed)
He has approximate 40 pack year smoking history. Since his hospital discharge, he has been cutting back and now has been tobacco free x48h. Pt congratulated on this. He is not using the NRT or other smoking cessation aides and is not interested in using them at this time.  --He was encouraged to reach out to [place] should he change his mind and wish to seek assistance with smoking cessation with pharmacological treatment --Continue to follow at future office visits

## 2022-02-18 NOTE — Assessment & Plan Note (Signed)
GFR has been persistently within CKD 3A range for several years. He had experienced hypokalemia during recently hospitalization so BMP was rechecked today. K stable. Creatinine is actually markedly improved today, eGFR 79. Will continue to monitor.

## 2022-02-18 NOTE — Assessment & Plan Note (Signed)
ICM s/p PCI 2016.

## 2022-02-18 NOTE — Patient Instructions (Signed)
It was a pleasure meeting you today and thank you for choosing [facility] as your primary care provider.    Follow-up: 3 months   Please make sure to arrive 15 minutes prior to your next appointment. If you arrive late, you may be asked to reschedule.   We look forward to seeing you next time. Please call our clinic at [phone] if you have any questions or concerns. The best time to call is Monday-Friday from 9am-4pm, but there is someone available 24/7. If after hours or the weekend, call the main hospital number and ask for the Internal Medicine Resident On-Call. If you need medication refills, please notify your pharmacy one week in advance and they will send [place] a request.  Thank you for letting us take part in your care. Wishing you the best!
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
## 2022-02-19 ENCOUNTER — Encounter: Payer: Self-pay | Admitting: Internal Medicine

## 2022-02-19 ENCOUNTER — Telehealth: Payer: Self-pay | Admitting: Emergency Medicine

## 2022-02-19 DIAGNOSIS — D638 Anemia in other chronic diseases classified elsewhere: Secondary | ICD-10-CM | POA: Insufficient documentation

## 2022-02-19 DIAGNOSIS — Z9189 Other specified personal risk factors, not elsewhere classified: Secondary | ICD-10-CM | POA: Insufficient documentation

## 2022-02-19 LAB — IRON,TIBC AND FERRITIN PANEL
Ferritin: 780 ng/mL — ABNORMAL HIGH (ref 30–400)
Iron Saturation: 16 % (ref 15–55)
Iron: 65 ug/dL (ref 38–169)
Total Iron Binding Capacity: 400 ug/dL (ref 250–450)
UIBC: 335 ug/dL (ref 111–343)

## 2022-02-19 LAB — BASIC METABOLIC PANEL
BUN/Creatinine Ratio: 19 (ref 9–20)
BUN: 22 mg/dL (ref 6–24)
CO2: 24 mmol/L (ref 20–29)
Calcium: 9.8 mg/dL (ref 8.7–10.2)
Chloride: 101 mmol/L (ref 96–106)
Creatinine, Ser: 1.14 mg/dL (ref 0.76–1.27)
Glucose: 93 mg/dL (ref 70–99)
Potassium: 4 mmol/L (ref 3.5–5.2)
Sodium: 142 mmol/L (ref 134–144)
eGFR: 79 mL/min/{1.73_m2} (ref >59)

## 2022-02-19 LAB — CBC
Hematocrit: 33.3 % — ABNORMAL LOW (ref 37.5–51.0)
Hemoglobin: 11.5 g/dL — ABNORMAL LOW (ref 13.0–17.7)
MCH: 30.1 pg (ref 26.6–33.0)
MCHC: 34.5 g/dL (ref 31.5–35.7)
MCV: 87 fL (ref 79–97)
Platelets: 431 10*3/uL (ref 150–450)
RBC: 3.82 x10E6/uL — ABNORMAL LOW (ref 4.14–5.80)
RDW: 15.7 % — ABNORMAL HIGH (ref 11.6–15.4)
WBC: 8.7 10*3/uL (ref 3.4–10.8)

## 2022-02-19 LAB — MICROALBUMIN / CREATININE URINE RATIO
Creatinine, Urine: 14.2 mg/dL
Microalb/Creat Ratio: 39 mg/g creat — ABNORMAL HIGH (ref 0–29)
Microalbumin, Urine: 5.6 ug/mL

## 2022-02-19 LAB — LIPID PANEL
Chol/HDL Ratio: 2.3 ratio (ref 0.0–5.0)
Cholesterol, Total: 255 mg/dL — ABNORMAL HIGH (ref 100–199)
HDL: 110 mg/dL (ref >39)
LDL Chol Calc (NIH): 131 mg/dL — ABNORMAL HIGH (ref 0–99)
Triglycerides: 83 mg/dL (ref 0–149)
VLDL Cholesterol Cal: 14 mg/dL (ref 5–40)

## 2022-02-19 NOTE — Addendum Note (Signed)
Addended by: Lalla Brothers T on: 02/19/2022 10:26 AM   Modules accepted: Level of Service

## 2022-02-19 NOTE — Assessment & Plan Note (Signed)
Hemoglobin 14.5 in December 2021. Found to be 12.4 at the time of his recent admission and drifted down to 11.8 at time of discharge the next day. Obtained a follow up CBC with iron panel today which shows slight decrease in hemoglobin again to 11.5. Iron studies with elevated ferritin, normal TIBC and iron. He does not report any current overt bleeding and is asymptomatic. Protein gap from recent admission 4.  Suspect anemia of chronic disease however will need to continue to intermittently monitor hemoglobin and pursue further workup if it continues to drop.

## 2022-02-19 NOTE — Progress Notes (Signed)
[facility] Attending  Case discussed with Dr. Christian  At the time of the visit.  We reviewed the resident's history and exam and pertinent patient test results.  I agree with the assessment, diagnosis, and plan of care documented in the resident's note.  

## 2022-02-19 NOTE — Assessment & Plan Note (Signed)
Current medications: lipitor '80mg'$  daily, ezetimibe '10mg'$  daily (recently resumed taking), fenofibrate '145mg'$  daily He reports adherence to these medications. LDL remains above goal--131.  Per cardiology, has refused PCSK9i in the past.  Follow up with cardiology for ongoing discussions regarding PCSK9i.

## 2022-02-19 NOTE — Telephone Encounter (Signed)
Called and spoke with patient. Results reviewed with patient, pt verbalized understanding,  questions (if any) answered.   ?

## 2022-02-19 NOTE — Assessment & Plan Note (Signed)
Consumes ~14 drinks per week. Typically has 2 shots of brandy every morning. Counseled on risks. Continue to monitor.

## 2022-02-19 NOTE — Telephone Encounter (Signed)
-----   Message from Mayetta, PA-C sent at 02/19/2022  9:38 AM EDT ----- Labs are normal

## 2022-02-24 ENCOUNTER — Other Ambulatory Visit: Payer: Self-pay | Admitting: Medical

## 2022-02-24 DIAGNOSIS — I5042 Chronic combined systolic (congestive) and diastolic (congestive) heart failure: Secondary | ICD-10-CM

## 2022-03-09 ENCOUNTER — Telehealth: Payer: Self-pay | Admitting: Internal Medicine

## 2022-03-09 NOTE — Telephone Encounter (Signed)
Called and spoke with pt. Pt is requesting a letter from provider to his employer explaining that his incr need to use the bathroom is due to being on a diuretic.   Notified pt I will ask provider in office as Dr. Saunders Revel is not in the office this week.

## 2022-03-09 NOTE — Telephone Encounter (Signed)
Pt c/o medication issue:  1. Name of Medication:   furosemide (LASIX) 40 MG tablet    2. How are you currently taking this medication (dosage and times per day)? Take 1 tablet (40 mg total) by mouth daily  3. Are you having a reaction (difficulty breathing--STAT)? No  4. What is your medication issue? Pt states that medication causes him to go to the restroom a lot while at work. Pt would like to know if Dr. Saunders Revel can provide him with a note for his employer explaining this. Pt would like a call back. Please advise

## 2022-03-09 NOTE — Telephone Encounter (Signed)
Attempted to call pt to let him know that I have left letter for work at the front desk.  No answer. No voicemail set up.   Will try to reach pt at a later time.

## 2022-03-10 NOTE — Telephone Encounter (Signed)
Attempted to call pt back. No answer. No voicemail set up.   When pt returns call, may tell pt letter is at front desk for pt to pick up at [facility] office.

## 2022-03-12 NOTE — Telephone Encounter (Signed)
Spoke with pt. Asked pt if he prefers I mail letter or leave at front desk.  Pt asks that I leave at front desk and says he will pick up tomorrow.  Pt appreciative of call. No further needs at this time.

## 2022-04-10 ENCOUNTER — Telehealth: Payer: Self-pay | Admitting: Internal Medicine

## 2022-04-10 NOTE — Telephone Encounter (Signed)
Advised this request should go to patients primary care provider. Provided them with name and number.

## 2022-04-10 NOTE — Telephone Encounter (Signed)
[facility] is calling to ask Dr. Saunders Revel to sign the patient's Death Certificate. The patient passed away in his home 9/76/73  Death Certificate can be faxed to  [phone]

## 2022-04-21 DEATH — deceased

## 2022-04-22 ENCOUNTER — Ambulatory Visit: Payer: 59 | Admitting: Internal Medicine

## 2022-05-06 ENCOUNTER — Encounter: Payer: 59 | Admitting: Internal Medicine
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
